# Patient Record
Sex: Female | Born: 1950
Health system: Southern US, Community
[De-identification: ages and names within clinical notes are randomized; demographics above are authoritative.]

## PROBLEM LIST (undated history)

## (undated) DIAGNOSIS — T7840XA Allergy, unspecified, initial encounter: Secondary | ICD-10-CM

## (undated) DIAGNOSIS — C3490 Malignant neoplasm of unspecified part of unspecified bronchus or lung: Secondary | ICD-10-CM

## (undated) DIAGNOSIS — N3010 Interstitial cystitis (chronic) without hematuria: Secondary | ICD-10-CM

## (undated) DIAGNOSIS — E079 Disorder of thyroid, unspecified: Secondary | ICD-10-CM

## (undated) DIAGNOSIS — K219 Gastro-esophageal reflux disease without esophagitis: Secondary | ICD-10-CM

## (undated) HISTORY — DX: Gastro-esophageal reflux disease without esophagitis: K21.9

## (undated) HISTORY — DX: Allergy, unspecified, initial encounter: T78.40XA

## (undated) HISTORY — PX: ABDOMINAL HYSTERECTOMY: SHX81

## (undated) HISTORY — DX: Malignant neoplasm of unspecified part of unspecified bronchus or lung: C34.90

## (undated) HISTORY — DX: Interstitial cystitis (chronic) without hematuria: N30.10

## (undated) HISTORY — PX: OTHER SURGICAL HISTORY: SHX169

---

## 1998-05-02 ENCOUNTER — Other Ambulatory Visit: Admission: RE | Admit: 1998-05-02 | Discharge: 1998-05-02 | Payer: Self-pay | Admitting: Obstetrics and Gynecology

## 1998-10-24 ENCOUNTER — Ambulatory Visit (HOSPITAL_COMMUNITY): Admission: RE | Admit: 1998-10-24 | Discharge: 1998-10-24 | Payer: Self-pay | Admitting: Gastroenterology

## 1999-05-29 ENCOUNTER — Other Ambulatory Visit: Admission: RE | Admit: 1999-05-29 | Discharge: 1999-05-29 | Payer: Self-pay | Admitting: Obstetrics and Gynecology

## 2000-01-14 ENCOUNTER — Encounter: Payer: Self-pay | Admitting: Orthopedic Surgery

## 2000-01-14 ENCOUNTER — Encounter: Admission: RE | Admit: 2000-01-14 | Discharge: 2000-01-14 | Payer: Self-pay | Admitting: Orthopedic Surgery

## 2000-07-04 ENCOUNTER — Emergency Department (HOSPITAL_COMMUNITY): Admission: EM | Admit: 2000-07-04 | Discharge: 2000-07-04 | Payer: Self-pay

## 2000-07-29 ENCOUNTER — Other Ambulatory Visit: Admission: RE | Admit: 2000-07-29 | Discharge: 2000-07-29 | Payer: Self-pay | Admitting: Obstetrics and Gynecology

## 2001-05-26 ENCOUNTER — Emergency Department (HOSPITAL_COMMUNITY): Admission: EM | Admit: 2001-05-26 | Discharge: 2001-05-27 | Payer: Self-pay | Admitting: Emergency Medicine

## 2001-08-12 ENCOUNTER — Other Ambulatory Visit: Admission: RE | Admit: 2001-08-12 | Discharge: 2001-08-12 | Payer: Self-pay | Admitting: Obstetrics and Gynecology

## 2002-08-12 ENCOUNTER — Other Ambulatory Visit: Admission: RE | Admit: 2002-08-12 | Discharge: 2002-08-12 | Payer: Self-pay | Admitting: Obstetrics and Gynecology

## 2003-08-16 ENCOUNTER — Other Ambulatory Visit: Admission: RE | Admit: 2003-08-16 | Discharge: 2003-08-16 | Payer: Self-pay | Admitting: Obstetrics and Gynecology

## 2007-08-08 DIAGNOSIS — E042 Nontoxic multinodular goiter: Secondary | ICD-10-CM | POA: Insufficient documentation

## 2008-08-16 DIAGNOSIS — E039 Hypothyroidism, unspecified: Secondary | ICD-10-CM | POA: Insufficient documentation

## 2008-08-16 DIAGNOSIS — R0602 Shortness of breath: Secondary | ICD-10-CM | POA: Insufficient documentation

## 2008-08-16 DIAGNOSIS — E785 Hyperlipidemia, unspecified: Secondary | ICD-10-CM | POA: Insufficient documentation

## 2010-05-21 ENCOUNTER — Emergency Department (HOSPITAL_BASED_OUTPATIENT_CLINIC_OR_DEPARTMENT_OTHER): Admission: EM | Admit: 2010-05-21 | Discharge: 2010-05-21 | Payer: Self-pay | Admitting: Emergency Medicine

## 2010-05-21 ENCOUNTER — Ambulatory Visit: Payer: Self-pay | Admitting: Diagnostic Radiology

## 2010-11-25 ENCOUNTER — Encounter: Payer: Self-pay | Admitting: Endocrinology

## 2010-12-15 ENCOUNTER — Emergency Department (INDEPENDENT_AMBULATORY_CARE_PROVIDER_SITE_OTHER): Payer: BC Managed Care – PPO

## 2010-12-15 ENCOUNTER — Emergency Department (HOSPITAL_BASED_OUTPATIENT_CLINIC_OR_DEPARTMENT_OTHER)
Admission: EM | Admit: 2010-12-15 | Discharge: 2010-12-15 | Disposition: A | Discharge status: Home / Self Care | Payer: BC Managed Care – PPO | Attending: Emergency Medicine | Admitting: Emergency Medicine

## 2010-12-15 DIAGNOSIS — W010XXA Fall on same level from slipping, tripping and stumbling without subsequent striking against object, initial encounter: Secondary | ICD-10-CM

## 2010-12-15 DIAGNOSIS — E039 Hypothyroidism, unspecified: Secondary | ICD-10-CM | POA: Insufficient documentation

## 2010-12-15 DIAGNOSIS — Y92009 Unspecified place in unspecified non-institutional (private) residence as the place of occurrence of the external cause: Secondary | ICD-10-CM | POA: Insufficient documentation

## 2010-12-15 DIAGNOSIS — M542 Cervicalgia: Secondary | ICD-10-CM

## 2010-12-15 DIAGNOSIS — S139XXA Sprain of joints and ligaments of unspecified parts of neck, initial encounter: Secondary | ICD-10-CM | POA: Insufficient documentation

## 2010-12-15 DIAGNOSIS — W19XXXA Unspecified fall, initial encounter: Secondary | ICD-10-CM | POA: Insufficient documentation

## 2011-01-19 LAB — URINALYSIS, ROUTINE W REFLEX MICROSCOPIC
Bilirubin Urine: NEGATIVE
Glucose, UA: NEGATIVE mg/dL
Hgb urine dipstick: NEGATIVE
Ketones, ur: NEGATIVE mg/dL
Nitrite: NEGATIVE
Protein, ur: NEGATIVE mg/dL
Specific Gravity, Urine: 1.019 (ref 1.005–1.030)
Urobilinogen, UA: 0.2 mg/dL (ref 0.0–1.0)
pH: 5.5 (ref 5.0–8.0)

## 2011-01-19 LAB — CBC
MCH: 32.5 pg (ref 26.0–34.0)
MCHC: 34.8 g/dL (ref 30.0–36.0)
Platelets: 231 10*3/uL (ref 150–400)

## 2011-01-19 LAB — POCT CARDIAC MARKERS: Myoglobin, poc: 45.6 ng/mL (ref 12–200)

## 2011-01-19 LAB — DIFFERENTIAL
Basophils Relative: 1 % (ref 0–1)
Eosinophils Absolute: 0.1 10*3/uL (ref 0.0–0.7)
Eosinophils Relative: 2 % (ref 0–5)
Monocytes Relative: 9 % (ref 3–12)
Neutrophils Relative %: 58 % (ref 43–77)

## 2011-01-19 LAB — BASIC METABOLIC PANEL
CO2: 30 mEq/L (ref 19–32)
Calcium: 8.8 mg/dL (ref 8.4–10.5)
Creatinine, Ser: 0.8 mg/dL (ref 0.4–1.2)
Glucose, Bld: 83 mg/dL (ref 70–99)

## 2011-01-19 LAB — URINE CULTURE: Colony Count: 75000

## 2011-04-09 ENCOUNTER — Emergency Department (HOSPITAL_COMMUNITY): Payer: BC Managed Care – PPO

## 2011-04-09 ENCOUNTER — Emergency Department (HOSPITAL_COMMUNITY)
Admission: EM | Admit: 2011-04-09 | Discharge: 2011-04-09 | Disposition: A | Discharge status: Home / Self Care | Payer: BC Managed Care – PPO | Attending: Emergency Medicine | Admitting: Emergency Medicine

## 2011-04-09 DIAGNOSIS — E039 Hypothyroidism, unspecified: Secondary | ICD-10-CM | POA: Insufficient documentation

## 2011-04-09 DIAGNOSIS — F29 Unspecified psychosis not due to a substance or known physiological condition: Secondary | ICD-10-CM | POA: Insufficient documentation

## 2011-04-09 DIAGNOSIS — R4182 Altered mental status, unspecified: Secondary | ICD-10-CM | POA: Insufficient documentation

## 2011-04-09 DIAGNOSIS — R5381 Other malaise: Secondary | ICD-10-CM | POA: Insufficient documentation

## 2011-04-09 LAB — BASIC METABOLIC PANEL
CO2: 28 mEq/L (ref 19–32)
Chloride: 102 mEq/L (ref 96–112)
GFR calc Af Amer: >60 mL/min (ref >60)
Potassium: 3.9 mEq/L (ref 3.5–5.1)
Sodium: 138 mEq/L (ref 135–145)

## 2011-04-09 LAB — AMMONIA: Ammonia: 22 umol/L (ref 11–60)

## 2011-04-09 LAB — URINALYSIS, ROUTINE W REFLEX MICROSCOPIC
Bilirubin Urine: NEGATIVE
Glucose, UA: NEGATIVE mg/dL
Hgb urine dipstick: NEGATIVE
Specific Gravity, Urine: 1.02 (ref 1.005–1.030)
Urobilinogen, UA: 0.2 mg/dL (ref 0.0–1.0)

## 2011-04-09 LAB — RAPID URINE DRUG SCREEN, HOSP PERFORMED
Amphetamines: NOT DETECTED
Barbiturates: NOT DETECTED
Benzodiazepines: NOT DETECTED
Cocaine: NOT DETECTED
Opiates: NOT DETECTED

## 2011-04-09 LAB — CBC
HCT: 42.2 % (ref 36.0–46.0)
Hemoglobin: 14.1 g/dL (ref 12.0–15.0)
MCV: 90.6 fL (ref 78.0–100.0)
RDW: 13.3 % (ref 11.5–15.5)
WBC: 6.9 10*3/uL (ref 4.0–10.5)

## 2011-04-09 LAB — ETHANOL: Alcohol, Ethyl (B): <11 mg/dL — ABNORMAL HIGH (ref 0–10)

## 2011-06-22 ENCOUNTER — Emergency Department (INDEPENDENT_AMBULATORY_CARE_PROVIDER_SITE_OTHER): Payer: BC Managed Care – PPO

## 2011-06-22 ENCOUNTER — Encounter: Payer: Self-pay | Admitting: Emergency Medicine

## 2011-06-22 ENCOUNTER — Emergency Department (HOSPITAL_BASED_OUTPATIENT_CLINIC_OR_DEPARTMENT_OTHER)
Admission: EM | Admit: 2011-06-22 | Discharge: 2011-06-22 | Disposition: A | Discharge status: Home / Self Care | Payer: BC Managed Care – PPO | Attending: Emergency Medicine | Admitting: Emergency Medicine

## 2011-06-22 DIAGNOSIS — R209 Unspecified disturbances of skin sensation: Secondary | ICD-10-CM

## 2011-06-22 DIAGNOSIS — E079 Disorder of thyroid, unspecified: Secondary | ICD-10-CM | POA: Insufficient documentation

## 2011-06-22 DIAGNOSIS — M542 Cervicalgia: Secondary | ICD-10-CM

## 2011-06-22 DIAGNOSIS — Y92410 Unspecified street and highway as the place of occurrence of the external cause: Secondary | ICD-10-CM | POA: Insufficient documentation

## 2011-06-22 DIAGNOSIS — R51 Headache: Secondary | ICD-10-CM

## 2011-06-22 DIAGNOSIS — S139XXA Sprain of joints and ligaments of unspecified parts of neck, initial encounter: Secondary | ICD-10-CM | POA: Insufficient documentation

## 2011-06-22 DIAGNOSIS — S161XXA Strain of muscle, fascia and tendon at neck level, initial encounter: Secondary | ICD-10-CM

## 2011-06-22 DIAGNOSIS — M549 Dorsalgia, unspecified: Secondary | ICD-10-CM

## 2011-06-22 DIAGNOSIS — M4120 Other idiopathic scoliosis, site unspecified: Secondary | ICD-10-CM

## 2011-06-22 DIAGNOSIS — M47812 Spondylosis without myelopathy or radiculopathy, cervical region: Secondary | ICD-10-CM

## 2011-06-22 HISTORY — DX: Disorder of thyroid, unspecified: E07.9

## 2011-06-22 MED ORDER — TRAMADOL HCL 50 MG PO TABS
100.0000 mg | ORAL_TABLET | Freq: Once | ORAL | Status: AC
  Administered 2011-06-22: 100 mg via ORAL
  Filled 2011-06-22: qty 2

## 2011-06-22 MED ORDER — TRAMADOL HCL 50 MG PO TABS: 50.0000 mg | ORAL_TABLET | Freq: Four times a day (QID) | ORAL | Status: AC | PRN
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
## 2011-06-23 NOTE — ED Provider Notes (Signed)
History/physical exam/procedure(s) were performed by non-physician practitioner and as supervising physician I was immediately available for consultation/collaboration. I have reviewed all notes and am in agreement with care and plan.   Hilario Quarry, MD 06/23/11 [phone]

## 2011-07-11 ENCOUNTER — Other Ambulatory Visit: Payer: Self-pay | Admitting: Specialist

## 2011-07-11 DIAGNOSIS — M542 Cervicalgia: Secondary | ICD-10-CM

## 2011-07-11 DIAGNOSIS — S161XXA Strain of muscle, fascia and tendon at neck level, initial encounter: Secondary | ICD-10-CM

## 2011-07-11 DIAGNOSIS — M5412 Radiculopathy, cervical region: Secondary | ICD-10-CM

## 2011-07-16 ENCOUNTER — Ambulatory Visit
Admission: RE | Admit: 2011-07-16 | Discharge: 2011-07-16 | Disposition: A | Discharge status: Home / Self Care | Payer: BC Managed Care – PPO | Source: Ambulatory Visit | Attending: Specialist | Admitting: Specialist

## 2011-07-16 DIAGNOSIS — S161XXA Strain of muscle, fascia and tendon at neck level, initial encounter: Secondary | ICD-10-CM

## 2011-07-16 DIAGNOSIS — M5412 Radiculopathy, cervical region: Secondary | ICD-10-CM

## 2011-07-16 DIAGNOSIS — M542 Cervicalgia: Secondary | ICD-10-CM

## 2011-08-08 ENCOUNTER — Other Ambulatory Visit: Payer: Self-pay | Admitting: Specialist

## 2011-08-08 DIAGNOSIS — M5416 Radiculopathy, lumbar region: Secondary | ICD-10-CM

## 2011-08-13 ENCOUNTER — Ambulatory Visit
Admission: RE | Admit: 2011-08-13 | Discharge: 2011-08-13 | Disposition: A | Discharge status: Home / Self Care | Payer: BC Managed Care – PPO | Source: Ambulatory Visit | Attending: Specialist | Admitting: Specialist

## 2011-08-13 DIAGNOSIS — M5416 Radiculopathy, lumbar region: Secondary | ICD-10-CM

## 2011-11-13 ENCOUNTER — Other Ambulatory Visit: Payer: Self-pay | Admitting: Specialist

## 2011-11-13 DIAGNOSIS — M5414 Radiculopathy, thoracic region: Secondary | ICD-10-CM

## 2011-11-15 ENCOUNTER — Ambulatory Visit
Admission: RE | Admit: 2011-11-15 | Discharge: 2011-11-15 | Disposition: A | Discharge status: Home / Self Care | Payer: BC Managed Care – PPO | Source: Ambulatory Visit | Attending: Specialist | Admitting: Specialist

## 2011-11-15 DIAGNOSIS — M5414 Radiculopathy, thoracic region: Secondary | ICD-10-CM

## 2012-02-09 ENCOUNTER — Emergency Department (INDEPENDENT_AMBULATORY_CARE_PROVIDER_SITE_OTHER): Payer: BC Managed Care – PPO

## 2012-02-09 ENCOUNTER — Encounter (HOSPITAL_BASED_OUTPATIENT_CLINIC_OR_DEPARTMENT_OTHER): Payer: Self-pay | Admitting: *Deleted

## 2012-02-09 ENCOUNTER — Emergency Department (HOSPITAL_BASED_OUTPATIENT_CLINIC_OR_DEPARTMENT_OTHER)
Admission: EM | Admit: 2012-02-09 | Discharge: 2012-02-09 | Disposition: A | Discharge status: Home / Self Care | Payer: BC Managed Care – PPO | Attending: Emergency Medicine | Admitting: Emergency Medicine

## 2012-02-09 DIAGNOSIS — M7989 Other specified soft tissue disorders: Secondary | ICD-10-CM

## 2012-02-09 DIAGNOSIS — W19XXXA Unspecified fall, initial encounter: Secondary | ICD-10-CM

## 2012-02-09 DIAGNOSIS — M79609 Pain in unspecified limb: Secondary | ICD-10-CM | POA: Insufficient documentation

## 2012-02-09 DIAGNOSIS — M25579 Pain in unspecified ankle and joints of unspecified foot: Secondary | ICD-10-CM | POA: Insufficient documentation

## 2012-02-09 DIAGNOSIS — S93409A Sprain of unspecified ligament of unspecified ankle, initial encounter: Secondary | ICD-10-CM | POA: Insufficient documentation

## 2012-02-09 DIAGNOSIS — E079 Disorder of thyroid, unspecified: Secondary | ICD-10-CM | POA: Insufficient documentation

## 2012-02-09 DIAGNOSIS — T07XXXA Unspecified multiple injuries, initial encounter: Secondary | ICD-10-CM

## 2012-02-09 DIAGNOSIS — M25569 Pain in unspecified knee: Secondary | ICD-10-CM | POA: Insufficient documentation

## 2012-02-09 DIAGNOSIS — Z043 Encounter for examination and observation following other accident: Secondary | ICD-10-CM

## 2012-02-09 DIAGNOSIS — IMO0002 Reserved for concepts with insufficient information to code with codable children: Secondary | ICD-10-CM | POA: Insufficient documentation

## 2012-02-09 MED ORDER — OXYCODONE-ACETAMINOPHEN 5-325 MG PO TABS
2.0000 | ORAL_TABLET | Freq: Once | ORAL | Status: AC
  Administered 2012-02-09: 2 via ORAL
  Filled 2012-02-09: qty 2

## 2012-02-09 MED ORDER — IBUPROFEN 800 MG PO TABS
800.0000 mg | ORAL_TABLET | Freq: Once | ORAL | Status: AC
  Administered 2012-02-09: 800 mg via ORAL
  Filled 2012-02-09: qty 1

## 2012-02-09 MED ORDER — HYDROCODONE-ACETAMINOPHEN 5-500 MG PO TABS: 1.0000 | ORAL_TABLET | Freq: Four times a day (QID) | ORAL | Status: AC | PRN

## 2012-02-09 MED ORDER — NAPROXEN 500 MG PO TABS: 500.0000 mg | ORAL_TABLET | Freq: Two times a day (BID) | ORAL | Status: AC

## 2012-02-09 NOTE — ED Notes (Signed)
Pt states she was walking and fell. Abrasions to knees. Swelling to right ankle and c/o pain to same. Ice applied at triage.

## 2012-02-09 NOTE — Discharge Instructions (Signed)
Ankle Sprain An ankle sprain is an injury to the strong, fibrous tissues (ligaments) that hold the bones of your ankle joint together.  CAUSES Ankle sprain usually is caused by a fall or by twisting your ankle. People who participate in sports are more prone to these types of injuries.  SYMPTOMS  Symptoms of ankle sprain include:  Pain in your ankle. The pain may be present at rest or only when you are trying to stand or walk.   Swelling.   Bruising. Bruising may develop immediately or within 1 to 2 days after your injury.   Difficulty standing or walking.  DIAGNOSIS  Your caregiver will ask you details about your injury and perform a physical exam of your ankle to determine if you have an ankle sprain. During the physical exam, your caregiver will press and squeeze specific areas of your foot and ankle. Your caregiver will try to move your ankle in certain ways. An X-ray exam may be done to be sure a bone was not broken or a ligament did not separate from one of the bones in your ankle (avulsion).  TREATMENT  Certain types of braces can help stabilize your ankle. Your caregiver can make a recommendation for this. Your caregiver may recommend the use of medication for pain. If your sprain is severe, your caregiver may refer you to a surgeon who helps to restore function to parts of your skeletal system (orthopedist) or a physical therapist. HOME CARE INSTRUCTIONS  Apply ice to your injury for 1 to 2 days or as directed by your caregiver. Applying ice helps to reduce inflammation and pain.  Put ice in a plastic bag.   Place a towel between your skin and the bag.   Leave the ice on for 15 to 20 minutes at a time, every 2 hours while you are awake.   Take over-the-counter or prescription medicines for pain, discomfort, or fever only as directed by your caregiver.   Keep your injured leg elevated, when possible, to lessen swelling.   If your caregiver recommends crutches, use them as  instructed. Gradually, put weight on the affected ankle. Continue to use crutches or a cane until you can walk without feeling pain in your ankle.   If you have a plaster splint, wear the splint as directed by your caregiver. Do not rest it on anything harder than a pillow the first 24 hours. Do not put weight on it. Do not get it wet. You may take it off to take a shower or bath.   You may have been given an elastic bandage to wear around your ankle to provide support. If the elastic bandage is too tight (you have numbness or tingling in your foot or your foot becomes cold and blue), adjust the bandage to make it comfortable.   If you have an air splint, you may blow more air into it or let air out to make it more comfortable. You may take your splint off at night and before taking a shower or bath.   Wiggle your toes in the splint several times per day if you are able.  SEEK MEDICAL CARE IF:   You have an increase in bruising, swelling, or pain.   Your toes feel cold.   Pain relief is not achieved with medication.  SEEK IMMEDIATE MEDICAL CARE IF: Your toes are numb or blue or you have severe pain. MAKE SURE YOU:   Understand these instructions.   Will watch your condition.     Will get help right away if you are not doing well or get worse.  Document Released: 10/21/2005 Document Revised: 10/10/2011 Document Reviewed: 05/25/2008 [employer] Patient Information 2012 [employer]. 
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
## 2012-03-04 ENCOUNTER — Other Ambulatory Visit: Payer: Self-pay | Admitting: Obstetrics and Gynecology

## 2012-03-04 NOTE — Telephone Encounter (Signed)
Tc to pt per rx req. Lm on vm to make pt aware rx has been sent to pharm as requested.

## 2012-03-18 ENCOUNTER — Encounter: Payer: Self-pay | Admitting: Obstetrics and Gynecology

## 2012-03-20 ENCOUNTER — Encounter: Payer: Self-pay | Admitting: Obstetrics and Gynecology

## 2012-03-20 ENCOUNTER — Ambulatory Visit (INDEPENDENT_AMBULATORY_CARE_PROVIDER_SITE_OTHER): Payer: BC Managed Care – PPO | Admitting: Obstetrics and Gynecology

## 2012-03-20 VITALS — BP 112/68 | Ht 64.0 in | Wt 137.0 lb

## 2012-03-20 DIAGNOSIS — N6011 Diffuse cystic mastopathy of right breast: Secondary | ICD-10-CM | POA: Insufficient documentation

## 2012-03-20 DIAGNOSIS — N6009 Solitary cyst of unspecified breast: Secondary | ICD-10-CM

## 2012-03-20 DIAGNOSIS — E039 Hypothyroidism, unspecified: Secondary | ICD-10-CM

## 2012-03-20 DIAGNOSIS — E079 Disorder of thyroid, unspecified: Secondary | ICD-10-CM

## 2012-03-20 DIAGNOSIS — N3010 Interstitial cystitis (chronic) without hematuria: Secondary | ICD-10-CM

## 2012-03-20 DIAGNOSIS — N951 Menopausal and female climacteric states: Secondary | ICD-10-CM

## 2012-03-20 DIAGNOSIS — R3915 Urgency of urination: Secondary | ICD-10-CM

## 2012-03-20 NOTE — Progress Notes (Signed)
The patient is not taking hormone replacement therapy The patient  is taking a Calcium supplement. Post-menopausal bleeding:no  Last Pap: was normal October  2003 Last mammogram: was normal April  2013 Last DEXA scan : T= pt unsure    Last colonoscopy:was normal  6 years ago  Urinary symptoms: none Normal bowel movements: Yes Reports abuse at home: No:  Subjective:    Jocelyn Parker is a 61 y.o. female No obstetric history on file. who presents for annual exam.  C/o recent onset of tremendous urge to void just prior to orgasm which can be supressed, but in doing so also aborts orgasm. She was also diagnosed with a right  breast cyst which has been noted on previous mammograms. She denies any significant breast pain at this time.  The following portions of the patient's history were reviewed and updated as appropriate: allergies, current medications, past family history, past medical history, past social history, past surgical history and problem list.  Review of Systems Pertinent items are noted in HPI. Gastrointestinal:No change in bowel habits, no abdominal pain, no rectal bleeding Genitourinary:negative for dysuria, frequency, hematuria, nocturia and urinary incontinence    Objective:     BP 112/68  Ht 5\' 4"  (1.626 m)  Wt 137 lb (62.143 kg)  BMI 23.52 kg/m2  Weight:  Wt Readings from Last 1 Encounters:  03/20/12 137 lb (62.143 kg)     BMI: Body mass index is 23.52 kg/(m^2). General Appearance: Alert, appropriate appearance for age. No acute distress HEENT: Grossly normal Neck / Thyroid: Supple, no masses, nodes or enlargement Lungs: clear to auscultation bilaterally Back: No CVA tenderness Breast Exam: Normal to inspection and right breast with a 3 cm cyst in the upper outer quadrant at approximately 11:00 position. This is mildly tender and easily mobile. Cardiovascular: Regular rate and rhythm. S1, S2, no murmur Gastrointestinal: Soft, non-tender, no masses or  organomegaly Pelvic Exam: External genitalia: normal general appearance Vaginal: atrophic mucosa and vaginal vault, is well suspended and well healed. Cervix: removed surgically Adnexa: non palpable Uterus: removed surgically Rectovaginal: normal rectal, no masses Lymphatic Exam: Non-palpable nodes in neck, clavicular, axillary, or inguinal regions Skin: no rash or abnormalities Neurologic: Normal gait and speech, no tremor  Psychiatric: Alert and oriented, appropriate affect.    Urinalysis:Not done today as she has regular followup with her urologist      Assessment:    Hormone replacement therapy Menopause Interstitial cystitis well-controlled  Good relief of symptoms of hot flashes Recurrent breast cysts Symptom ofperi-orgasmic urinary urgency   Plan:    All questions answered. Breast self exam technique reviewed and patient encouraged to perform self-exam monthly. Diagnosis explained in detail, including differential. referral to physical therapy for evaluation and treatment of orgasmic urinary urgency   Follow-up:  for annual exam

## 2012-03-21 ENCOUNTER — Encounter: Payer: Self-pay | Admitting: Obstetrics and Gynecology

## 2012-03-21 DIAGNOSIS — E079 Disorder of thyroid, unspecified: Secondary | ICD-10-CM | POA: Insufficient documentation

## 2012-03-21 MED ORDER — ESTRADIOL 0.1 MG/24HR TD PTTW: 1.0000 | MEDICATED_PATCH | TRANSDERMAL | Status: AC

## 2012-03-23 ENCOUNTER — Ambulatory Visit: Payer: Self-pay | Admitting: Obstetrics and Gynecology

## 2012-04-10 DIAGNOSIS — L650 Telogen effluvium: Secondary | ICD-10-CM | POA: Insufficient documentation

## 2012-05-24 IMAGING — CT CT HEAD W/O CM
1 series · 16 of 30 positions shown, 20 images · non-contrast
Comparison: None available

CLINICAL DATA: Altered mental status, confusion, lethargy

CT HEAD WITHOUT CONTRAST
TECHNIQUE: Contiguous axial images were obtained from the base of
the skull through the vertex without contrast

[Series 3: headseq 4.8 h45s · axial · 0.43mm/px · z∈[-163,-34]mm · 16 of 30 slices shown, 20 images]
[im 2/30  brain]
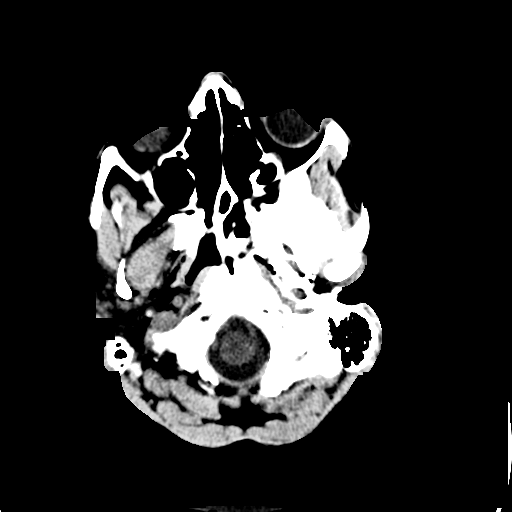
[im 2/30  bone]
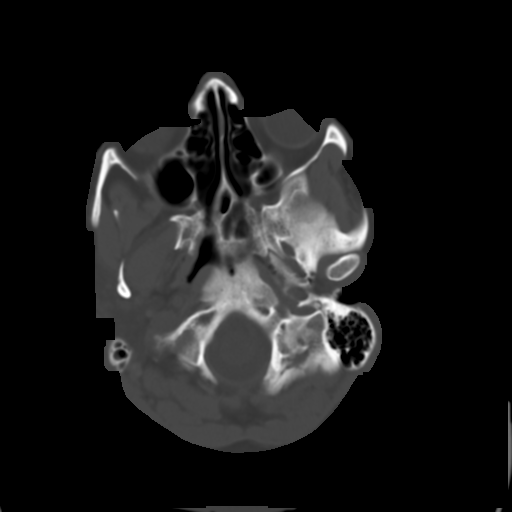
[im 4/30  brain]
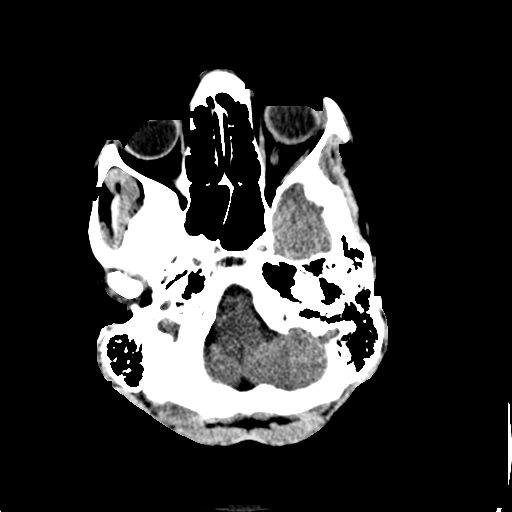
[im 6/30  brain]
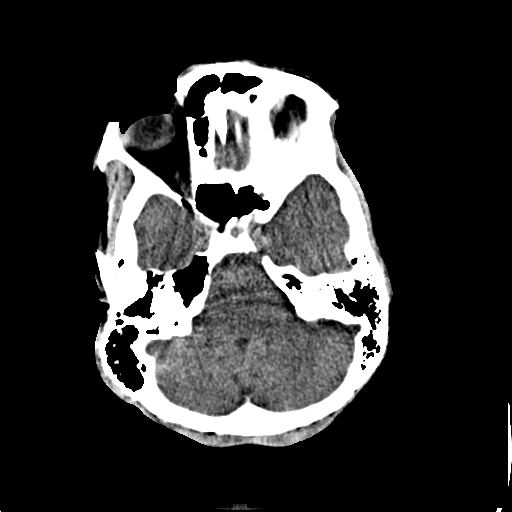
[im 8/30  brain]
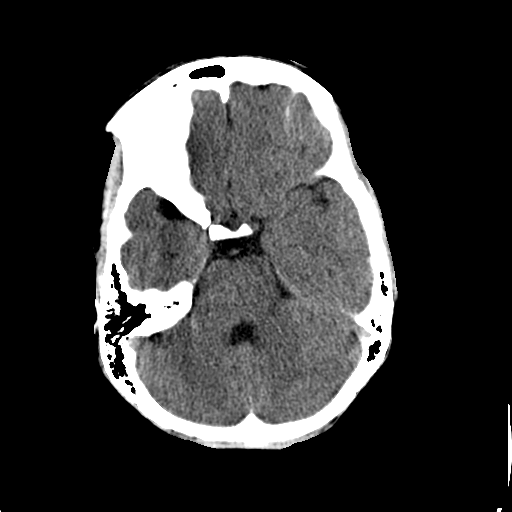
[im 9/30  brain]
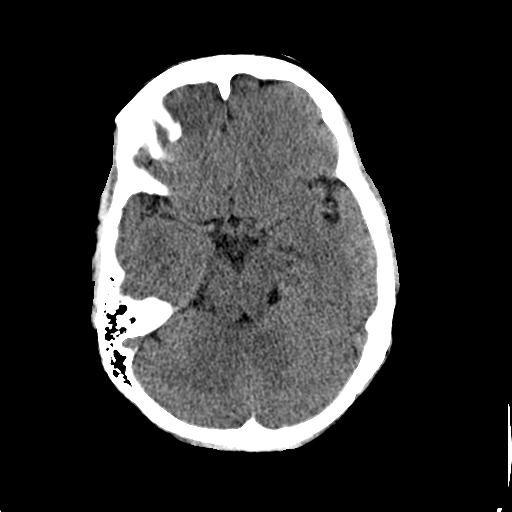
[im 9/30  bone]
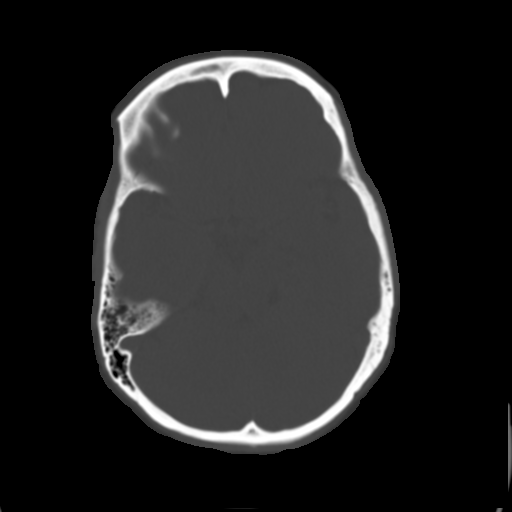
[im 11/30  brain]
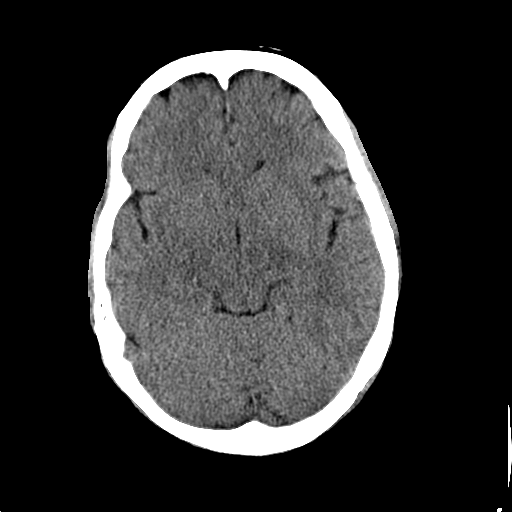
[im 13/30  brain]
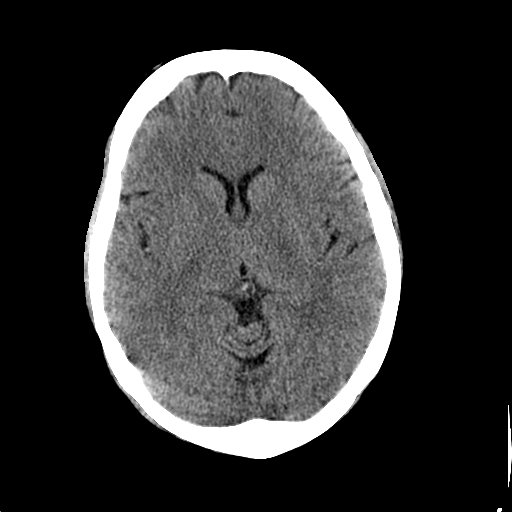
[im 15/30  brain]
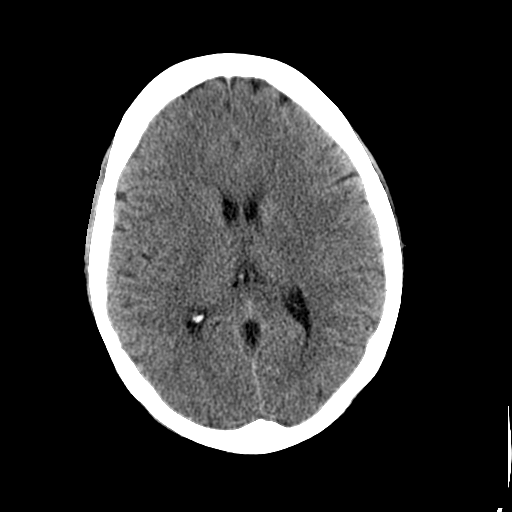
[im 16/30  brain]
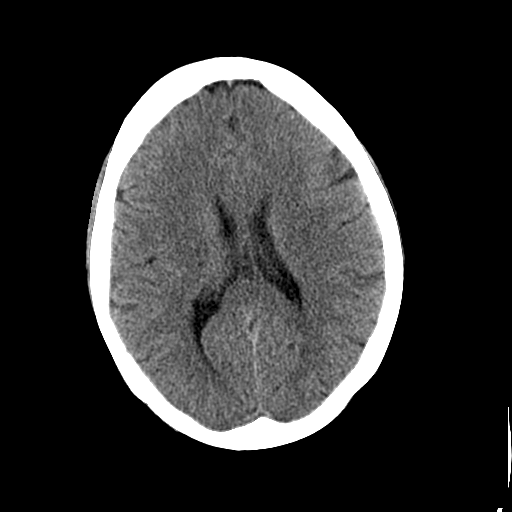
[im 16/30  bone]
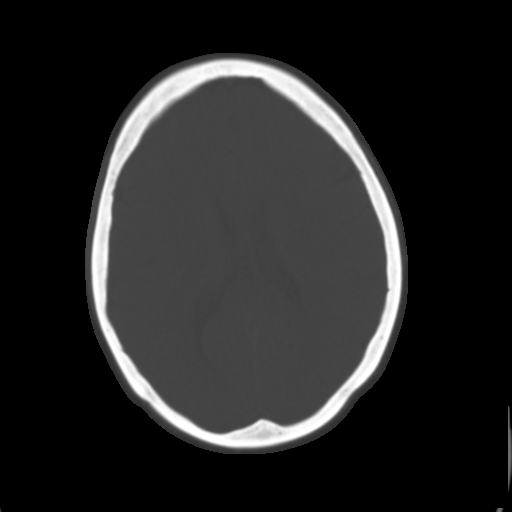
[im 18/30  brain]
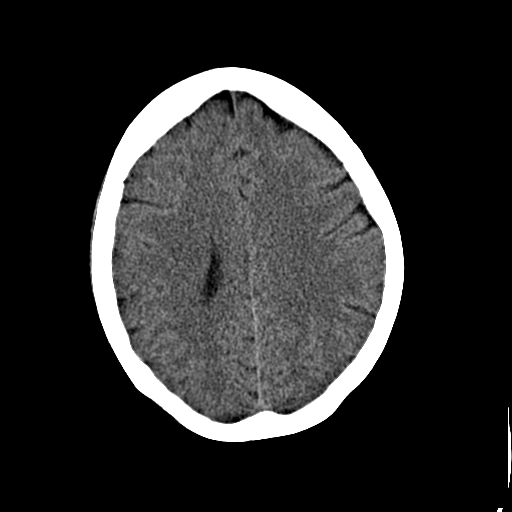
[im 20/30  brain]
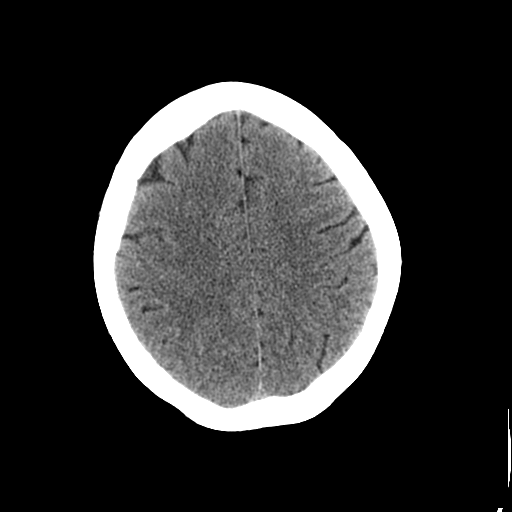
[im 22/30  brain]
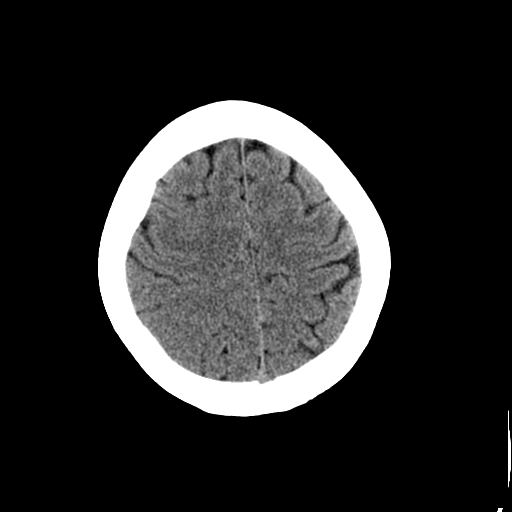
[im 23/30  brain]
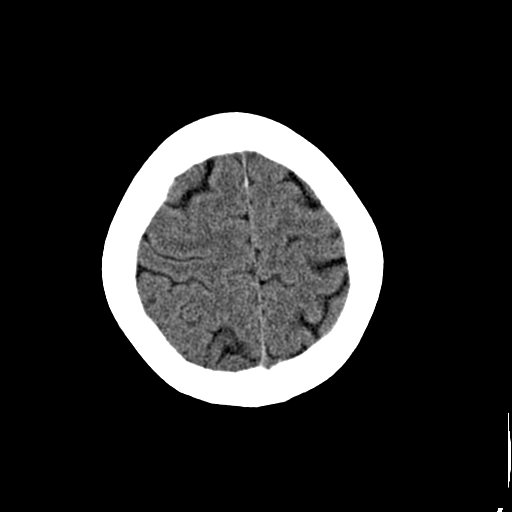
[im 23/30  bone]
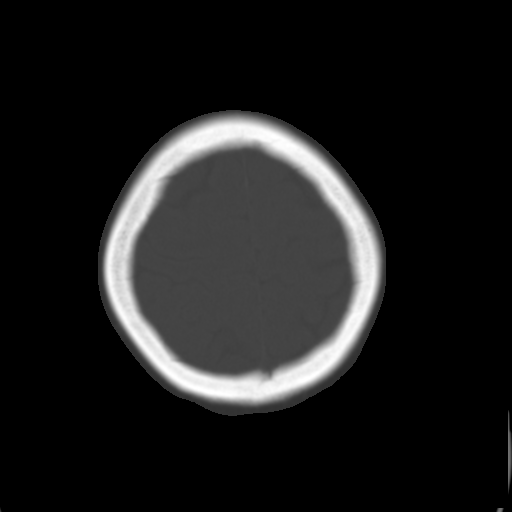
[im 25/30  brain]
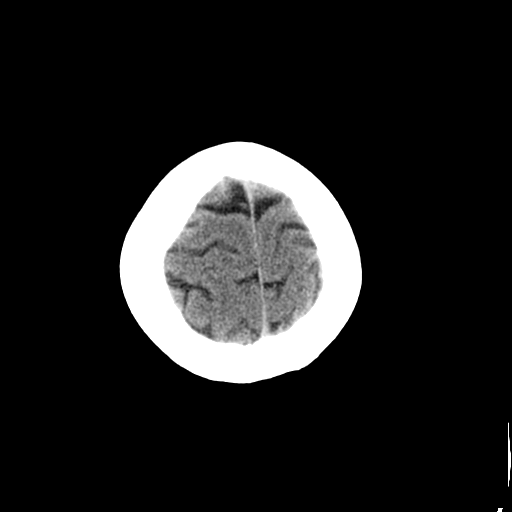
[im 27/30  brain]
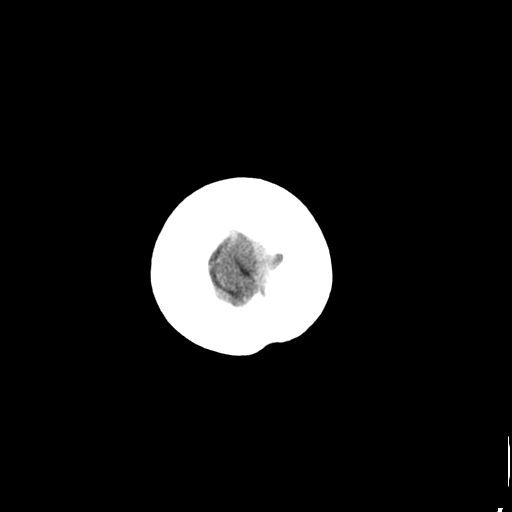
[im 29/30  brain]
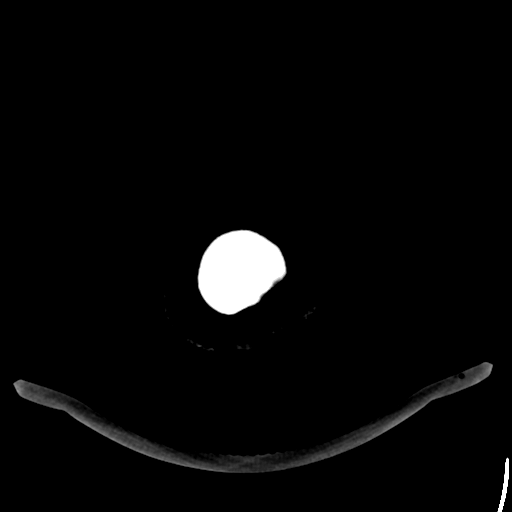

[16 of 30 positions shown; findings below may reference images not displayed]

FINDINGS: The brain has a normal appearance without evidence for
hemorrhage, acute infarction, hydrocephalus, or mass lesion.  There
is no extra axial fluid collection.  The skull and paranasal
sinuses are normal.
IMPRESSION: Normal CT of the head without contrast.

## 2012-09-07 DIAGNOSIS — R319 Hematuria, unspecified: Secondary | ICD-10-CM | POA: Insufficient documentation

## 2013-01-04 ENCOUNTER — Other Ambulatory Visit: Payer: Self-pay | Admitting: Gastroenterology

## 2013-01-04 DIAGNOSIS — R109 Unspecified abdominal pain: Secondary | ICD-10-CM

## 2013-01-06 ENCOUNTER — Ambulatory Visit
Admission: RE | Admit: 2013-01-06 | Discharge: 2013-01-06 | Disposition: A | Discharge status: Home / Self Care | Payer: BC Managed Care – PPO | Source: Ambulatory Visit | Attending: Gastroenterology | Admitting: Gastroenterology

## 2013-01-06 DIAGNOSIS — R109 Unspecified abdominal pain: Secondary | ICD-10-CM

## 2013-01-06 MED ORDER — IOHEXOL 300 MG/ML  SOLN
100.0000 mL | Freq: Once | INTRAMUSCULAR | Status: AC | PRN
  Administered 2013-01-06: 100 mL via INTRAVENOUS

## 2013-07-28 ENCOUNTER — Other Ambulatory Visit: Payer: Self-pay | Admitting: Endocrinology

## 2013-07-28 DIAGNOSIS — E049 Nontoxic goiter, unspecified: Secondary | ICD-10-CM

## 2014-01-03 DIAGNOSIS — K589 Irritable bowel syndrome without diarrhea: Secondary | ICD-10-CM | POA: Insufficient documentation

## 2014-07-28 ENCOUNTER — Other Ambulatory Visit: Payer: Self-pay | Admitting: Endocrinology

## 2014-07-28 DIAGNOSIS — E049 Nontoxic goiter, unspecified: Secondary | ICD-10-CM

## 2014-08-08 ENCOUNTER — Other Ambulatory Visit: Payer: BC Managed Care – PPO

## 2014-12-20 DIAGNOSIS — N281 Cyst of kidney, acquired: Secondary | ICD-10-CM | POA: Insufficient documentation

## 2014-12-20 DIAGNOSIS — N390 Urinary tract infection, site not specified: Secondary | ICD-10-CM | POA: Insufficient documentation

## 2014-12-21 DIAGNOSIS — E755 Other lipid storage disorders: Secondary | ICD-10-CM | POA: Insufficient documentation

## 2014-12-21 DIAGNOSIS — H0260 Xanthelasma of unspecified eye, unspecified eyelid: Secondary | ICD-10-CM | POA: Insufficient documentation

## 2015-05-24 DIAGNOSIS — E559 Vitamin D deficiency, unspecified: Secondary | ICD-10-CM | POA: Insufficient documentation

## 2016-01-02 DIAGNOSIS — G8929 Other chronic pain: Secondary | ICD-10-CM | POA: Insufficient documentation

## 2016-01-02 DIAGNOSIS — R109 Unspecified abdominal pain: Secondary | ICD-10-CM | POA: Insufficient documentation

## 2016-02-03 ENCOUNTER — Encounter (HOSPITAL_BASED_OUTPATIENT_CLINIC_OR_DEPARTMENT_OTHER): Payer: Self-pay | Admitting: Emergency Medicine

## 2016-02-03 DIAGNOSIS — E079 Disorder of thyroid, unspecified: Secondary | ICD-10-CM | POA: Diagnosis not present

## 2016-02-03 DIAGNOSIS — K529 Noninfective gastroenteritis and colitis, unspecified: Secondary | ICD-10-CM | POA: Insufficient documentation

## 2016-02-03 DIAGNOSIS — Z90710 Acquired absence of both cervix and uterus: Secondary | ICD-10-CM | POA: Insufficient documentation

## 2016-02-03 DIAGNOSIS — Z87448 Personal history of other diseases of urinary system: Secondary | ICD-10-CM | POA: Diagnosis not present

## 2016-02-03 DIAGNOSIS — Z79899 Other long term (current) drug therapy: Secondary | ICD-10-CM | POA: Insufficient documentation

## 2016-02-03 DIAGNOSIS — R109 Unspecified abdominal pain: Secondary | ICD-10-CM | POA: Diagnosis present

## 2016-02-03 NOTE — ED Notes (Signed)
Pt in c/o abd pain and diarrhea onset today.

## 2016-02-04 ENCOUNTER — Emergency Department (HOSPITAL_BASED_OUTPATIENT_CLINIC_OR_DEPARTMENT_OTHER)
Admission: EM | Admit: 2016-02-04 | Discharge: 2016-02-04 | Disposition: A | Discharge status: Home / Self Care | Payer: BC Managed Care – PPO | Attending: Emergency Medicine | Admitting: Emergency Medicine

## 2016-02-04 DIAGNOSIS — K529 Noninfective gastroenteritis and colitis, unspecified: Secondary | ICD-10-CM

## 2016-02-04 LAB — CBC WITH DIFFERENTIAL/PLATELET
BASOS ABS: 0 10*3/uL (ref 0.0–0.1)
Basophils Relative: 0 %
EOS ABS: 0 10*3/uL (ref 0.0–0.7)
EOS PCT: 0 %
HCT: 40.9 % (ref 36.0–46.0)
Hemoglobin: 14.1 g/dL (ref 12.0–15.0)
LYMPHS PCT: 7 %
Lymphs Abs: 0.4 10*3/uL — ABNORMAL LOW (ref 0.7–4.0)
MCH: 31.9 pg (ref 26.0–34.0)
MCHC: 34.5 g/dL (ref 30.0–36.0)
MCV: 92.5 fL (ref 78.0–100.0)
MONO ABS: 0.3 10*3/uL (ref 0.1–1.0)
Monocytes Relative: 5 %
Neutro Abs: 5 10*3/uL (ref 1.7–7.7)
Neutrophils Relative %: 88 %
PLATELETS: 210 10*3/uL (ref 150–400)
RBC: 4.42 MIL/uL (ref 3.87–5.11)
RDW: 12.7 % (ref 11.5–15.5)
WBC: 5.7 10*3/uL (ref 4.0–10.5)

## 2016-02-04 LAB — COMPREHENSIVE METABOLIC PANEL
ALT: 15 U/L (ref 14–54)
ANION GAP: 7 (ref 5–15)
AST: 18 U/L (ref 15–41)
Albumin: 3.7 g/dL (ref 3.5–5.0)
Alkaline Phosphatase: 53 U/L (ref 38–126)
BUN: 14 mg/dL (ref 6–20)
CHLORIDE: 106 mmol/L (ref 101–111)
CO2: 24 mmol/L (ref 22–32)
Calcium: 8.5 mg/dL — ABNORMAL LOW (ref 8.9–10.3)
Creatinine, Ser: 0.9 mg/dL (ref 0.44–1.00)
Glucose, Bld: 112 mg/dL — ABNORMAL HIGH (ref 65–99)
POTASSIUM: 4 mmol/L (ref 3.5–5.1)
SODIUM: 137 mmol/L (ref 135–145)
Total Bilirubin: 0.9 mg/dL (ref 0.3–1.2)
Total Protein: 6.9 g/dL (ref 6.5–8.1)

## 2016-02-04 LAB — LIPASE, BLOOD: LIPASE: 17 U/L (ref 11–51)

## 2016-02-04 MED ORDER — ONDANSETRON HCL 4 MG/2ML IJ SOLN
4.0000 mg | Freq: Once | INTRAMUSCULAR | Status: AC
  Administered 2016-02-04: 4 mg via INTRAVENOUS
  Filled 2016-02-04: qty 2

## 2016-02-04 MED ORDER — KETOROLAC TROMETHAMINE 30 MG/ML IJ SOLN
30.0000 mg | Freq: Once | INTRAMUSCULAR | Status: AC
  Administered 2016-02-04: 30 mg via INTRAVENOUS
  Filled 2016-02-04: qty 1

## 2016-02-04 MED ORDER — SODIUM CHLORIDE 0.9 % IV BOLUS (SEPSIS)
1000.0000 mL | Freq: Once | INTRAVENOUS | Status: AC
  Administered 2016-02-04: 1000 mL via INTRAVENOUS

## 2016-02-04 MED ORDER — ONDANSETRON 8 MG PO TBDP: ORAL_TABLET | ORAL | Status: DC

## 2016-02-04 MED ORDER — HYDROCODONE-ACETAMINOPHEN 5-325 MG PO TABS: 1.0000 | ORAL_TABLET | Freq: Four times a day (QID) | ORAL | Status: DC | PRN

## 2016-02-04 NOTE — Discharge Instructions (Signed)
Zofran as prescribed as needed for nausea.  Hydrocodone as prescribed as needed for pain.  Return to the emergency department if pain significantly worsens, he developed high fevers, bloody stool, or other new and concerning symptoms.

## 2016-02-04 NOTE — ED Provider Notes (Signed)
CSN: [id number]     Arrival date & time 02/03/16  2326 History  By signing my name below, I, Terrance Branch, attest that this documentation has been prepared under the direction and in the presence of Veryl Speak, MD. Electronically Signed: Randa Evens, ED Scribe. 02/04/2016. 12:30 AM.    Chief Complaint  Patient presents with  . Abdominal Pain  . Diarrhea   Patient is a 65 y.o. female presenting with abdominal pain and diarrhea. The history is provided by the patient. No language interpreter was used.  Abdominal Pain Associated symptoms: diarrhea and nausea   Associated symptoms: no fever and no vomiting   Diarrhea Associated symptoms: abdominal pain   Associated symptoms: no fever and no vomiting    HPI Comments: Jocelyn Parker is a 65 y.o. female who presents to the Emergency Department complaining of intermittent abdominal pain onset today that she describes as a cramping pain. Pt reports associated nausea, diarrhea, decreased appetite and HA. Pt states she is unsure of sick contacts. Reports HX of hysterectomy. Denies blood in stool or vomiting.   Past Medical History  Diagnosis Date  . Thyroid disease   . Allergy     sulfa  . Chronic interstitial cystitis    Past Surgical History  Procedure Laterality Date  . Abdominal hysterectomy    . Lumps removed from breast      1970's ,1990's and in 2000's  nono cancer    Family History  Problem Relation Age of Onset  . Cancer Mother     renal   . Heart attack Father   . Stroke Maternal Grandmother   . Heart attack Paternal Grandfather    Social History  Substance Use Topics  . Smoking status: Never Smoker   . Smokeless tobacco: Never Used  . Alcohol Use: No   OB History    No data available     Review of Systems  Constitutional: Negative for fever.  Gastrointestinal: Positive for nausea, abdominal pain and diarrhea. Negative for vomiting and blood in stool.  All other systems reviewed and are  negative.    Allergies  Sulfa antibiotics  Home Medications   Prior to Admission medications   Medication Sig Start Date End Date Taking? Authorizing Provider  Estradiol (VIVELLE TD) Place 1 patch onto the skin 2 (two) times a week.      Historical Provider, MD  estradiol (VIVELLE-DOT) 0.1 MG/24HR Place 1 patch (0.1 mg total) onto the skin 2 (two) times a week. 03/21/12   Eldred Manges, MD  levothyroxine (SYNTHROID, LEVOTHROID) 75 MCG tablet Take 75 mcg by mouth daily.      Historical Provider, MD   BP 111/76 mmHg  Pulse 108  Temp(Src) 98.6 F (37 C) (Oral)  Resp 18  Ht '5\' 3"'$  (1.6 m)  Wt 123 lb (55.792 kg)  BMI 21.79 kg/m2  SpO2 98%   Physical Exam  Constitutional: She is oriented to person, place, and time. She appears well-developed and well-nourished. No distress.  HENT:  Head: Normocephalic and atraumatic.  Eyes: Conjunctivae and EOM are normal.  Neck: Neck supple. No tracheal deviation present.  Cardiovascular: Normal rate, regular rhythm and normal heart sounds.   No murmur heard. Pulmonary/Chest: Effort normal and breath sounds normal. No respiratory distress. She has no wheezes. She has no rales.  Abdominal: Soft. Bowel sounds are normal. She exhibits no distension. There is tenderness. There is no rebound and no guarding.  Mild TTP in the epigastric region.   Musculoskeletal: Normal  range of motion.  Neurological: She is alert and oriented to person, place, and time.  Skin: Skin is warm and dry.  Psychiatric: She has a normal mood and affect. Her behavior is normal.  Nursing note and vitals reviewed.   ED Course  Procedures (including critical care time) DIAGNOSTIC STUDIES: Oxygen Saturation is 98% on RA, normal by my interpretation.    COORDINATION OF CARE: 12:26 AM-Discussed treatment plan with pt at bedside and pt agreed to plan.     Labs Review Labs Reviewed - No data to display  Imaging Review No results found.     MDM   Final  diagnoses:  None      Patient presents with diarrhea and abdominal cramping. She has no fever or white count and her abdominal exam is benign. I highly suspect a viral gastroenteritis as the etiology. She will be discharged with pain and nausea medication and when necessary return.   I personally performed the services described in this documentation, which was scribed in my presence. The recorded information has been reviewed and is accurate.       Veryl Speak, MD 02/04/16 [phone]

## 2017-03-18 DIAGNOSIS — Z8249 Family history of ischemic heart disease and other diseases of the circulatory system: Secondary | ICD-10-CM | POA: Insufficient documentation

## 2017-09-02 DIAGNOSIS — Z90710 Acquired absence of both cervix and uterus: Secondary | ICD-10-CM | POA: Insufficient documentation

## 2019-03-19 LAB — BASIC METABOLIC PANEL
BUN: 14 (ref 4–21)
BUN: 14 (ref 4–21)
Creatinine: 0.8 (ref 0.5–1.1)
Glucose: 80
Potassium: 4.4 (ref 3.4–5.3)
Potassium: 4.4 (ref 3.4–5.3)
Sodium: 140 (ref 137–147)
Sodium: 140 (ref 137–147)

## 2019-03-19 LAB — HEPATIC FUNCTION PANEL
ALT: 21 (ref 7–35)
AST: 20 (ref 13–35)
Alkaline Phosphatase: 55 (ref 25–125)
Bilirubin, Total: 0.6

## 2019-03-19 LAB — CBC AND DIFFERENTIAL
HCT: 42 (ref 36–46)
Hemoglobin: 14.1 (ref 12.0–16.0)
Platelets: 251 (ref 150–399)
WBC: 4.6

## 2019-03-19 LAB — VITAMIN D 25 HYDROXY (VIT D DEFICIENCY, FRACTURES): Vit D, 25-Hydroxy: 31

## 2019-03-19 LAB — VITAMIN B12: Vitamin B-12: 231

## 2019-04-01 ENCOUNTER — Other Ambulatory Visit: Payer: Self-pay

## 2019-04-01 ENCOUNTER — Ambulatory Visit (INDEPENDENT_AMBULATORY_CARE_PROVIDER_SITE_OTHER): Payer: Medicare Other | Admitting: Family Medicine

## 2019-04-01 ENCOUNTER — Encounter: Payer: Self-pay | Admitting: Family Medicine

## 2019-04-01 VITALS — Ht 63.5 in | Wt 115.0 lb

## 2019-04-01 DIAGNOSIS — M8580 Other specified disorders of bone density and structure, unspecified site: Secondary | ICD-10-CM

## 2019-04-01 DIAGNOSIS — N83209 Unspecified ovarian cyst, unspecified side: Secondary | ICD-10-CM | POA: Insufficient documentation

## 2019-04-01 DIAGNOSIS — J302 Other seasonal allergic rhinitis: Secondary | ICD-10-CM

## 2019-04-01 DIAGNOSIS — R42 Dizziness and giddiness: Secondary | ICD-10-CM | POA: Diagnosis not present

## 2019-04-01 DIAGNOSIS — E7800 Pure hypercholesterolemia, unspecified: Secondary | ICD-10-CM

## 2019-04-01 DIAGNOSIS — E559 Vitamin D deficiency, unspecified: Secondary | ICD-10-CM

## 2019-04-01 DIAGNOSIS — Z1159 Encounter for screening for other viral diseases: Secondary | ICD-10-CM

## 2019-04-01 DIAGNOSIS — Z114 Encounter for screening for human immunodeficiency virus [HIV]: Secondary | ICD-10-CM

## 2019-04-01 DIAGNOSIS — R5383 Other fatigue: Secondary | ICD-10-CM

## 2019-04-01 DIAGNOSIS — Z9189 Other specified personal risk factors, not elsewhere classified: Secondary | ICD-10-CM

## 2019-04-01 DIAGNOSIS — N951 Menopausal and female climacteric states: Secondary | ICD-10-CM | POA: Insufficient documentation

## 2019-04-01 DIAGNOSIS — K9049 Malabsorption due to intolerance, not elsewhere classified: Secondary | ICD-10-CM

## 2019-04-01 DIAGNOSIS — N3010 Interstitial cystitis (chronic) without hematuria: Secondary | ICD-10-CM

## 2019-04-01 DIAGNOSIS — E039 Hypothyroidism, unspecified: Secondary | ICD-10-CM

## 2019-04-01 NOTE — Progress Notes (Signed)
Virtual Visit via Video   Due to the COVID-19 pandemic, this visit was completed with telemedicine (audio/video) technology to reduce patient and provider exposure as well as to preserve personal protective equipment.  I connected with Jocelyn Parker by a video enabled telemedicine application and verified that I am speaking with the correct person using two identifiers. Location patient: Home Location provider: [facility], Office Persons participating in the virtual visit: Sevyn, Markham, DO   I discussed the limitations of evaluation and management by telemedicine and the availability of in person appointments. The patient expressed understanding and agreed to proceed.  Care Team   Patient Care Team: Briscoe Deutscher, DO as PCP - General (Family Medicine) Jacelyn Pi, MD as Referring Physician (Endocrinology) Ronald Lobo, MD as Consulting Physician (Gastroenterology)  Subjective:   HPI: Patient presents for NEW PATIENT appointment.   See Assessment and Plan section for Problem Based Charting of issues discussed today.  Review of Systems  Constitutional: Negative for chills, fever, malaise/fatigue and weight loss.  Respiratory: Negative for cough, shortness of breath and wheezing.   Cardiovascular: Negative for chest pain, palpitations and leg swelling.  Gastrointestinal: Negative for abdominal pain, constipation, diarrhea, nausea and vomiting.  Genitourinary: Negative for dysuria and urgency.  Musculoskeletal: Negative for joint pain and myalgias.  Skin: Negative for rash.  Neurological: Positive for dizziness and headaches. Negative for tremors, speech change, focal weakness, seizures and loss of consciousness.  Psychiatric/Behavioral: Negative for depression, substance abuse and suicidal ideas. The patient is not nervous/anxious.     Patient Active Problem List   Diagnosis Date Noted  . Cyst of ovary 04/01/2019  . Menopausal symptom 04/01/2019  .  Osteopenia 04/01/2019  . History of hysterectomy 09/02/2017  . Family history of early CAD 03/18/2017  . Right flank pain, chronic 01/02/2016  . Vitamin D deficiency 05/24/2015  . 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 Cyst of kidney, acquired 12/20/2014  . Recurrent UTI 12/20/2014  . Irritable bowel syndrome 01/03/2014  . Hematuria, unspecified 09/07/2012  . Telogen effluvium 04/10/2012  . Thyroid disease   . Interstitial cystitis (chronic) without hematuria 03/20/2012  . Bilateral fibrocystic breast disease (FCBD) 03/20/2012  . Hypothyroidism 08/16/2008  . Hyperlipidemia, unspecified 08/16/2008  . Nontoxic multinodular goiter 08/08/2007    Social History   Tobacco Use  . Smoking status: Never Smoker  . Smokeless tobacco: Never Used  Substance Use Topics  . Alcohol use: No    Current Outpatient Medications:  .  estradiol (VIVELLE-DOT) 0.1 MG/24HR, Place 1 patch (0.1 mg total) onto the skin 2 (two) times a week., Disp: 8 patch, Rfl: 12 .  fluticasone (FLONASE) 50 MCG/ACT nasal spray, fluticasone propionate 50 mcg/actuation nasal spray,suspension  SHAKE LQ AND U 2 SPRAYS IEN D, Disp: , Rfl:  .  ibuprofen (ADVIL) 800 MG tablet, ibuprofen 800 mg tablet, Disp: , Rfl:  .  levothyroxine (SYNTHROID) 50 MCG tablet, Take 50 mcg by mouth daily before breakfast., Disp: , Rfl:  .  levothyroxine (SYNTHROID, LEVOTHROID) 75 MCG tablet, Take 75 mcg by mouth daily.  , Disp: , Rfl:  .  magnesium 30 MG tablet, Take by mouth., Disp: , Rfl:  .  meclizine (ANTIVERT) 12.5 MG tablet, meclizine 12.5 mg tablet, Disp: , Rfl:  .  ondansetron (ZOFRAN ODT) 8 MG disintegrating tablet, 8mg  ODT q4 hours prn nausea, Disp: 6 tablet, Rfl: 0 .  UNABLE TO FIND, Med Name: CBD Cream, Disp: , Rfl:  .  Meth-Hyo-M Bl-Na Phos-Ph Sal (URIBEL) 118  MG CAPS, Take 1 capsule (118 mg total) by mouth 4 (four) times daily., Disp: 120 capsule, Rfl: 0  Allergies  Allergen Reactions  . Sulfa Antibiotics Hives  . Montelukast Sodium      dizziness   Objective:   VITALS: Per patient if applicable, see vitals. GENERAL: Alert, appears well and in no acute distress. HEENT: Atraumatic, conjunctiva clear, no obvious abnormalities on inspection of external nose and ears. NECK: Normal movements of the head and neck. CARDIOPULMONARY: No increased WOB. Speaking in clear sentences. I:E ratio WNL.  MS: Moves all visible extremities without noticeable abnormality. PSYCH: Pleasant and cooperative, well-groomed. Speech normal rate and rhythm. Affect is appropriate. Insight and judgement are appropriate. Attention is focused, linear, and appropriate.  NEURO: CN grossly intact. Oriented as arrived to appointment on time with no prompting. Moves both UE equally.  SKIN: No obvious lesions, wounds, erythema, or cyanosis noted on face or hands.  Depression screen [facility] 2/9 04/02/2019  Decreased Interest 0  Down, Depressed, Hopeless 0  PHQ - 2 Score 0  Altered sleeping 0  Tired, decreased energy 2  Change in appetite 3  Feeling bad or failure about yourself  0  Trouble concentrating 0  Moving slowly or fidgety/restless 0  Suicidal thoughts 0  PHQ-9 Score 5  Difficult doing work/chores Not difficult at all    Assessment and Plan:   1. Encounter for screening for HIV - HIV Antibody (routine testing w rflx); Future  2. Encounter for hepatitis C virus screening test for high risk patient - Hepatitis C antibody; Future  3. Dizziness, weight loss. Five pound weight loss. Normal weight 119-120. No at 110-115. No edema. Feeling that she is eating normally. Lots of food intolerance. 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 GI is Buccini, last colonoscopy at age 4. Hx of cyst in right breast with plans to remove, Dr. Berdine Addison. Followed by [name] for thyroid, with normal between 1-2. Hx of inuslin resistance. States that she is "not a good eater."  - CBC with Differential/Platelet; Future - Comprehensive metabolic panel;  Future - TSH; Future - Vitamin B12; Future - Magnesium  4. Acquired hypothyroidism Patient denies diarrhea, heat / cold intolerance and palpitations.  Lab Results  Component Value Date   TSH 1.01 04/05/2019   TSH 1.981 04/09/2011   No results found for: FREET4   - levothyroxine (SYNTHROID) 50 MCG tablet; Take 50 mcg by mouth daily before breakfast.  5. Pure hypercholesterolemia Trying to exercise on a regular basis? Yes. Compliant with diet? Yes.  Lab Results  Component Value Date   CHOL 277 (H) 04/05/2019   HDL 62.00 04/05/2019   LDLCALC 194 (H) 04/05/2019   TRIG 103.0 04/05/2019   CHOLHDL 4 04/05/2019   Lab Results  Component Value Date   ALT 20 04/05/2019   AST 17 04/05/2019   ALKPHOS 55 04/05/2019   BILITOT 0.5 04/05/2019     - Comprehensive metabolic panel; Future - Lipid panel; Future  6. Osteopenia, unspecified location Vitamin D > 50.  7. Seasonal allergies - fluticasone (FLONASE) 50 MCG/ACT nasal spray; fluticasone propionate 50 mcg/actuation nasal spray  8. Vertigo Hx of and uses Antivert prn. - meclizine (ANTIVERT) 12.5 MG tablet; meclizine 12.5 mg tablet  9. Lightheadedness - CBC with Differential/Platelet; Future - TSH; Future  10. Vitamin D deficiency - VITAMIN D 25 Hydroxy (Vit-D Deficiency, Fractures); Future  . COVID-19 Education: The signs and symptoms of COVID-19 were discussed with the patient and how to  seek care for testing if needed. The importance of social distancing was discussed today. . Reviewed expectations re: course of current medical issues. . Discussed self-management of symptoms. . Outlined signs and symptoms indicating need for more acute intervention. . Patient verbalized understanding and all questions were answered. Marland Kitchen Health Maintenance issues including appropriate healthy diet, exercise, and smoking avoidance were discussed with patient. . See orders for this visit as documented in the electronic medical  record.  Briscoe Deutscher, DO  Records requested if needed. Time spent: 45 minutes, of which >50% was spent in obtaining information about her symptoms, reviewing her previous labs, evaluations, and treatments, counseling her about her condition (please see the discussed topics above), and developing a plan to further investigate it; she had a number of questions which I addressed.

## 2019-04-02 ENCOUNTER — Ambulatory Visit: Payer: BC Managed Care – PPO | Admitting: Family Medicine

## 2019-04-04 ENCOUNTER — Encounter: Payer: Self-pay | Admitting: Family Medicine

## 2019-04-05 ENCOUNTER — Other Ambulatory Visit: Payer: Self-pay

## 2019-04-05 ENCOUNTER — Other Ambulatory Visit (INDEPENDENT_AMBULATORY_CARE_PROVIDER_SITE_OTHER): Payer: Medicare Other

## 2019-04-05 DIAGNOSIS — Z9189 Other specified personal risk factors, not elsewhere classified: Secondary | ICD-10-CM

## 2019-04-05 DIAGNOSIS — Z1159 Encounter for screening for other viral diseases: Secondary | ICD-10-CM

## 2019-04-05 DIAGNOSIS — E559 Vitamin D deficiency, unspecified: Secondary | ICD-10-CM

## 2019-04-05 DIAGNOSIS — E7800 Pure hypercholesterolemia, unspecified: Secondary | ICD-10-CM | POA: Diagnosis not present

## 2019-04-05 DIAGNOSIS — R5383 Other fatigue: Secondary | ICD-10-CM | POA: Diagnosis not present

## 2019-04-05 DIAGNOSIS — Z114 Encounter for screening for human immunodeficiency virus [HIV]: Secondary | ICD-10-CM | POA: Diagnosis not present

## 2019-04-05 DIAGNOSIS — R42 Dizziness and giddiness: Secondary | ICD-10-CM | POA: Diagnosis not present

## 2019-04-05 LAB — COMPREHENSIVE METABOLIC PANEL
ALT: 20 U/L (ref 0–35)
AST: 17 U/L (ref 0–37)
Albumin: 4 g/dL (ref 3.5–5.2)
Alkaline Phosphatase: 55 U/L (ref 39–117)
BUN: 12 mg/dL (ref 6–23)
CO2: 32 mEq/L (ref 19–32)
Calcium: 9.3 mg/dL (ref 8.4–10.5)
Chloride: 102 mEq/L (ref 96–112)
Creatinine, Ser: 0.73 mg/dL (ref 0.40–1.20)
GFR: 96.46 mL/min (ref >60.00)
Glucose, Bld: 73 mg/dL (ref 70–99)
Potassium: 4.5 mEq/L (ref 3.5–5.1)
Sodium: 139 mEq/L (ref 135–145)
Total Bilirubin: 0.5 mg/dL (ref 0.2–1.2)
Total Protein: 6.8 g/dL (ref 6.0–8.3)

## 2019-04-05 LAB — TSH: TSH: 1.01 u[IU]/mL (ref 0.35–4.50)

## 2019-04-05 LAB — CBC WITH DIFFERENTIAL/PLATELET
Basophils Absolute: 0 10*3/uL (ref 0.0–0.1)
Basophils Relative: 0.6 % (ref 0.0–3.0)
Eosinophils Absolute: 0 10*3/uL (ref 0.0–0.7)
Eosinophils Relative: 0.5 % (ref 0.0–5.0)
HCT: 41.5 % (ref 36.0–46.0)
Hemoglobin: 14.2 g/dL (ref 12.0–15.0)
Lymphocytes Relative: 30.3 % (ref 12.0–46.0)
Lymphs Abs: 1.3 10*3/uL (ref 0.7–4.0)
MCHC: 34.3 g/dL (ref 30.0–36.0)
MCV: 96.4 fl (ref 78.0–100.0)
Monocytes Absolute: 0.4 10*3/uL (ref 0.1–1.0)
Monocytes Relative: 8.5 % (ref 3.0–12.0)
Neutro Abs: 2.6 10*3/uL (ref 1.4–7.7)
Neutrophils Relative %: 60.1 % (ref 43.0–77.0)
Platelets: 278 10*3/uL (ref 150.0–400.0)
RBC: 4.3 Mil/uL (ref 3.87–5.11)
RDW: 14.1 % (ref 11.5–15.5)
WBC: 4.3 10*3/uL (ref 4.0–10.5)

## 2019-04-05 LAB — LIPID PANEL
Cholesterol: 277 mg/dL — ABNORMAL HIGH (ref 0–200)
HDL: 62 mg/dL (ref >39.00)
LDL Cholesterol: 194 mg/dL — ABNORMAL HIGH (ref 0–99)
NonHDL: 214.57
Total CHOL/HDL Ratio: 4
Triglycerides: 103 mg/dL (ref 0.0–149.0)
VLDL: 20.6 mg/dL (ref 0.0–40.0)

## 2019-04-05 LAB — VITAMIN D 25 HYDROXY (VIT D DEFICIENCY, FRACTURES): VITD: 54.89 ng/mL (ref 30.00–100.00)

## 2019-04-05 LAB — VITAMIN B12: Vitamin B-12: 158 pg/mL — ABNORMAL LOW (ref 211–911)

## 2019-04-06 LAB — HEPATITIS C ANTIBODY
Hepatitis C Ab: NONREACTIVE
SIGNAL TO CUT-OFF: 0.01 (ref <1.00)

## 2019-04-06 LAB — HIV ANTIBODY (ROUTINE TESTING W REFLEX): HIV 1&2 Ab, 4th Generation: NONREACTIVE

## 2019-04-07 ENCOUNTER — Other Ambulatory Visit: Payer: Self-pay

## 2019-04-07 DIAGNOSIS — H814 Vertigo of central origin: Secondary | ICD-10-CM

## 2019-04-07 DIAGNOSIS — R42 Dizziness and giddiness: Secondary | ICD-10-CM

## 2019-04-08 ENCOUNTER — Other Ambulatory Visit: Payer: Self-pay

## 2019-04-08 ENCOUNTER — Ambulatory Visit (HOSPITAL_COMMUNITY)
Admission: RE | Admit: 2019-04-08 | Discharge: 2019-04-08 | Disposition: A | Discharge status: Home / Self Care | Payer: Medicare Other | Source: Ambulatory Visit | Attending: Family Medicine | Admitting: Family Medicine

## 2019-04-08 DIAGNOSIS — R42 Dizziness and giddiness: Secondary | ICD-10-CM | POA: Diagnosis present

## 2019-04-08 DIAGNOSIS — H814 Vertigo of central origin: Secondary | ICD-10-CM

## 2019-04-08 NOTE — Progress Notes (Signed)
Jocelyn Parker is a 68 y.o. female is here for follow up.  Assessment and Plan:   Hyperlipidemia, LDL 194, HDL 62 Lab Results  Component Value Date   CHOL 277 (H) 04/05/2019   HDL 62.00 04/05/2019   LDLCALC 194 (H) 04/05/2019   TRIG 103.0 04/05/2019   CHOLHDL 4 04/05/2019   Lab Results  Component Value Date   ALT 20 04/05/2019   AST 17 04/05/2019   ALKPHOS 55 04/05/2019   BILITOT 0.5 04/05/2019   The 10-year ASCVD risk score Jocelyn Parker DC Jr., et al., 2013) is: 9.4%   Values used to calculate the score:     Age: 57 years     Sex: Female     Is Non-Hispanic African American: Yes     Diabetic: No     Tobacco smoker: No     Systolic Blood Pressure: 468 mmHg     Is BP treated: No     HDL Cholesterol: 62 mg/dL     Total Cholesterol: 277 mg/dL  Cardiovascular event risk at 9.4% Will work on risk factors, including LDL. Okay to offer Cardiac CT.  B12 deficiency Patient brings in labs from previous visit with Neurology. Hx of the same. Discussed benefit v risk of treatment. Hx of IC flare with supplement. Patient wonders if red dye may be contributor. She is willing to try again. Will give injection today and Rx Uribel if she develops symptoms over the weekend. Will check in on Monday. Such a low baseline will require ongoing injections and patient is aware. Consider Elmiron.   Hypothyroidism, followed by Dr. [name] Lab Results  Component Value Date   TSH 1.01 04/05/2019    Lab Results  Component Value Date   TSH 1.01 04/05/2019   TSH 1.981 04/09/2011     Interstitial cystitis (chronic) without hematuria See B12 plan. Will monitor closely.   Orders Placed This Encounter  Procedures  . Referral to Nutrition and Diabetes Services  . EKG 12-Lead   Meds ordered this encounter  Medications  . Meth-Hyo-M Bl-Na Phos-Ph Sal (URIBEL) 118 MG CAPS    Sig: Take 1 capsule (118 mg total) by mouth 4 (four) times daily.    Dispense:  120 capsule    Refill:  0  . cyanocobalamin  ((VITAMIN B-12)) injection 1,000 mcg   Health Maintenance:   Health Maintenance Due  Topic Date Due  . MAMMOGRAM  01/25/2003  . COLONOSCOPY  01/25/2003  . DEXA SCAN  01/24/2018  . PNA vac Low Risk Adult (1 of 2 - PCV13) 01/24/2018  . TETANUS/TDAP  07/22/2018   Depression screen [facility] 2/9 04/02/2019  Decreased Interest 0  Down, Depressed, Hopeless 0  PHQ - 2 Score 0  Altered sleeping 0  Tired, decreased energy 2  Change in appetite 3  Feeling bad or failure about yourself  0  Trouble concentrating 0  Moving slowly or fidgety/restless 0  Suicidal thoughts 0  PHQ-9 Score 5  Difficult doing work/chores Not difficult at all   PMHx, SurgHx, SocialHx, FamHx, Medications, and Allergies were reviewed in the Visit Navigator and updated as appropriate.   Patient Active Problem List   Diagnosis Date Noted  . B12 deficiency 04/11/2019  . Cardiovascular event risk at 9.4% 04/11/2019  . Cyst of ovary 04/01/2019  . Menopausal symptom 04/01/2019  . Osteopenia 04/01/2019  . History of hysterectomy 09/02/2017  . Family history of early CAD 03/18/2017  . Right flank pain, chronic 01/02/2016  . Vitamin D deficiency 05/24/2015  .  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 Cyst of kidney, acquired 12/20/2014  . Recurrent UTI 12/20/2014  . Irritable bowel syndrome 01/03/2014  . Hematuria, unspecified 09/07/2012  . Telogen effluvium 04/10/2012  . Interstitial cystitis (chronic) without hematuria 03/20/2012  . Bilateral fibrocystic breast disease (FCBD) 03/20/2012  . Hypothyroidism, followed by Dr. [name] 08/16/2008  . Hyperlipidemia, LDL 194, HDL 62 08/16/2008  . Nontoxic multinodular goiter 08/08/2007   Social History   Tobacco Use  . Smoking status: Never Smoker  . Smokeless tobacco: Never Used  Substance Use Topics  . Alcohol use: No  . Drug use: No   Current Medications and Allergies:   .  estradiol (VIVELLE-DOT) 0.1 MG/24HR, Place 1 patch (0.1 mg total) onto the skin 2 (two) times a  week., Disp: 8 patch, Rfl: 12 .  fluticasone (FLONASE) 50 MCG/ACT nasal spray, fluticasone propionate 50 mcg/actuation nasal spray,suspension  SHAKE LQ AND U 2 SPRAYS IEN D, Disp: , Rfl:  .  ibuprofen (ADVIL) 800 MG tablet, ibuprofen 800 mg tablet, Disp: , Rfl:  .  levothyroxine (SYNTHROID) 50 MCG tablet, Take 50 mcg by mouth daily before breakfast., Disp: , Rfl:  .  levothyroxine (SYNTHROID, LEVOTHROID) 75 MCG tablet, Take 75 mcg by mouth daily.  , Disp: , Rfl:  .  magnesium 30 MG tablet, Take by mouth., Disp: , Rfl:  .  meclizine (ANTIVERT) 12.5 MG tablet, meclizine 12.5 mg tablet, Disp: , Rfl:  .  ondansetron (ZOFRAN ODT) 8 MG disintegrating tablet, 8mg  ODT q4 hours prn nausea, Disp: 6 tablet, Rfl: 0 .  UNABLE TO FIND, Med Name: CBD Cream, Disp: , Rfl:    Allergies  Allergen Reactions  . Sulfa Antibiotics Hives  . Montelukast Sodium     dizziness   Review of Systems   Pertinent items are noted in the HPI. Otherwise, ROS is negative.  Vitals:   Vitals:   04/09/19 1334  BP: 118/76  Pulse: 69  Temp: 98.1 F (36.7 C)  TempSrc: Oral  SpO2: 99%  Weight: 114 lb (51.7 kg)  Height: 5' 2.5" (1.588 m)     Body mass index is 20.52 kg/m. Physical Exam:   General: Cooperative, alert and oriented, well developed, well nourished, in no acute distress. HEENT: Pupils equal round reactive light and extraocular movements intact. Conjunctivae and lids unremarkable. No pallor or cyanosis, dentition good. Neck: No thyromegaly.  Cardiovascular: Regular rhythm. No murmurs appreciated.  Lungs: Normal work of breathing. Clear bilaterally without rales, rhonchi, or wheezing.  Abdomen: Soft, nontender, no masses. Normal bowel sounds. Extremities: No clubbing, cyanosis, erythema. No edema.  Skin: Warm and dry. Neurologic: No focal deficits.  Psychiatric: Normal affect and thought content.   EKG: normal EKG, normal sinus rhythm.   . Reviewed expectations re: course of current medical  issues. . Discussed self-management of symptoms. . Outlined signs and symptoms indicating need for more acute intervention. . Patient verbalized understanding and all questions were answered. Marland Kitchen Health Maintenance issues including appropriate healthy diet, exercise, and smoking avoidance were discussed with patient. . See orders for this visit as documented in the electronic medical record. . Patient received an After Visit Summary.  Briscoe Deutscher, DO [place], [facility] 04/11/2019

## 2019-04-09 ENCOUNTER — Encounter: Payer: Self-pay | Admitting: Family Medicine

## 2019-04-09 ENCOUNTER — Ambulatory Visit (INDEPENDENT_AMBULATORY_CARE_PROVIDER_SITE_OTHER): Payer: Medicare Other | Admitting: Family Medicine

## 2019-04-09 VITALS — BP 118/76 | HR 69 | Temp 98.1°F | Ht 62.5 in | Wt 114.0 lb

## 2019-04-09 DIAGNOSIS — E7800 Pure hypercholesterolemia, unspecified: Secondary | ICD-10-CM

## 2019-04-09 DIAGNOSIS — R42 Dizziness and giddiness: Secondary | ICD-10-CM | POA: Diagnosis not present

## 2019-04-09 DIAGNOSIS — E538 Deficiency of other specified B group vitamins: Secondary | ICD-10-CM | POA: Diagnosis not present

## 2019-04-09 DIAGNOSIS — Z9189 Other specified personal risk factors, not elsewhere classified: Secondary | ICD-10-CM

## 2019-04-09 DIAGNOSIS — E039 Hypothyroidism, unspecified: Secondary | ICD-10-CM

## 2019-04-09 DIAGNOSIS — H814 Vertigo of central origin: Secondary | ICD-10-CM | POA: Diagnosis not present

## 2019-04-09 DIAGNOSIS — N3010 Interstitial cystitis (chronic) without hematuria: Secondary | ICD-10-CM

## 2019-04-09 DIAGNOSIS — R634 Abnormal weight loss: Secondary | ICD-10-CM

## 2019-04-09 DIAGNOSIS — E042 Nontoxic multinodular goiter: Secondary | ICD-10-CM

## 2019-04-09 MED ORDER — CYANOCOBALAMIN 1000 MCG/ML IJ SOLN
1000.0000 ug | Freq: Once | INTRAMUSCULAR | Status: AC
  Administered 2019-04-09: 1000 ug via INTRAMUSCULAR

## 2019-04-09 MED ORDER — URIBEL 118 MG PO CAPS: 1.0000 | ORAL_CAPSULE | Freq: Four times a day (QID) | ORAL | 0 refills | Status: DC

## 2019-04-11 ENCOUNTER — Encounter: Payer: Self-pay | Admitting: Family Medicine

## 2019-04-11 DIAGNOSIS — E538 Deficiency of other specified B group vitamins: Secondary | ICD-10-CM | POA: Insufficient documentation

## 2019-04-11 DIAGNOSIS — Z9189 Other specified personal risk factors, not elsewhere classified: Secondary | ICD-10-CM | POA: Insufficient documentation

## 2019-04-11 NOTE — Assessment & Plan Note (Signed)
Lab Results  Component Value Date   TSH 1.01 04/05/2019    Lab Results  Component Value Date   TSH 1.01 04/05/2019   TSH 1.981 04/09/2011

## 2019-04-11 NOTE — Assessment & Plan Note (Signed)
Patient brings in labs from previous visit with Neurology. Hx of the same. Discussed benefit v risk of treatment. Hx of IC flare with supplement. Patient wonders if red dye may be contributor. She is willing to try again. Will give injection today and Rx Uribel if she develops symptoms over the weekend. Will check in on Monday. Such a low baseline will require ongoing injections and patient is aware. Consider Elmiron.

## 2019-04-11 NOTE — Assessment & Plan Note (Addendum)
Will work on risk factors, including LDL. Okay to offer Cardiac CT.

## 2019-04-11 NOTE — Assessment & Plan Note (Signed)
See B12 plan. Will monitor closely.

## 2019-04-11 NOTE — Assessment & Plan Note (Signed)
Lab Results  Component Value Date   CHOL 277 (H) 04/05/2019   HDL 62.00 04/05/2019   LDLCALC 194 (H) 04/05/2019   TRIG 103.0 04/05/2019   CHOLHDL 4 04/05/2019   Lab Results  Component Value Date   ALT 20 04/05/2019   AST 17 04/05/2019   ALKPHOS 55 04/05/2019   BILITOT 0.5 04/05/2019   The 10-year ASCVD risk score [name] DC Jr., et al., 2013) is: 9.4%   Values used to calculate the score:     Age: 68 years     Sex: Female     Is Non-Hispanic African American: Yes     Diabetic: No     Tobacco smoker: No     Systolic Blood Pressure: 703 mmHg     Is BP treated: No     HDL Cholesterol: 62 mg/dL     Total Cholesterol: 277 mg/dL

## 2019-04-15 ENCOUNTER — Telehealth: Payer: Self-pay

## 2019-04-15 ENCOUNTER — Ambulatory Visit (INDEPENDENT_AMBULATORY_CARE_PROVIDER_SITE_OTHER): Payer: Medicare Other

## 2019-04-15 ENCOUNTER — Other Ambulatory Visit: Payer: Self-pay

## 2019-04-15 DIAGNOSIS — E538 Deficiency of other specified B group vitamins: Secondary | ICD-10-CM

## 2019-04-15 MED ORDER — "SYRINGE 25G X 1"" 3 ML MISC": 1.0000 "application " | 0 refills | Status: DC

## 2019-04-15 MED ORDER — CYANOCOBALAMIN 1000 MCG/ML IJ SOLN
1000.0000 ug | Freq: Once | INTRAMUSCULAR | Status: AC
  Administered 2019-04-15: 1000 ug via INTRAMUSCULAR

## 2019-04-15 MED ORDER — CYANOCOBALAMIN 1000 MCG/ML IJ SOLN: INTRAMUSCULAR | 15 refills | Status: DC

## 2019-04-15 NOTE — Telephone Encounter (Signed)
Received call from Dr. Berdine Addison from [facility] he wanted to let office know that she would like to have B-12 done daily. I have called patient and made app for her to come in the office today and she will get injection and then will do next at home. She will do 1 a day for 4 days then one a week for 4 weeks.

## 2019-04-15 NOTE — Progress Notes (Signed)
Per orders of Dr. Juleen China, injection of B-12 given by Francella Solian in left deltoid. Patient tolerated injection well. Patient was instructed on how to give injections at home. Provided with copy of instructions as well as photos of where able to give it. She will call with any questions. We have called in all supplies to pharmacy.

## 2019-04-19 ENCOUNTER — Encounter: Payer: Self-pay | Admitting: Physician Assistant

## 2019-04-24 ENCOUNTER — Other Ambulatory Visit: Payer: Self-pay | Admitting: Family Medicine

## 2019-04-24 DIAGNOSIS — J302 Other seasonal allergic rhinitis: Secondary | ICD-10-CM

## 2019-04-28 ENCOUNTER — Telehealth: Payer: Self-pay | Admitting: Family Medicine

## 2019-04-28 NOTE — Telephone Encounter (Signed)
Please advise if pt can be seen in office this week. Pt wants to eliminate "blood clot".   Copied from [place] [id number]. Topic: Appointment Scheduling - Scheduling Inquiry for Clinic >> Apr 28, 2019 10:21 AM Burchel, Abbi R wrote: [facility] called to request an appt for pt w/Dr Juleen China re: swelling in rt leg post surgery.  Please call pt to schedule: [phone].

## 2019-04-28 NOTE — Telephone Encounter (Signed)
We do not have any in office appointments left this week. Please see if Dr. Jonni Sanger can see or if patient can possibly be seen at [facility].  Thanks!

## 2019-04-29 ENCOUNTER — Telehealth: Payer: Self-pay | Admitting: Family Medicine

## 2019-04-29 NOTE — Telephone Encounter (Signed)
Called Jocelyn Parker to advise. There was miscommunication. Jocelyn Parker stated she just wanted to know if a scan can be done without having an appt with the dr. Abbott Pao was upset when I called offering appt with Dr., Jonni Sanger. Jocelyn Parker stated she called her surgeon and they are setting up a scan for her. Nothing further needed at this time.

## 2019-04-29 NOTE — Telephone Encounter (Signed)
[id number] - [address], [address] AT [facility] [address] [phone] (Phone) [phone] (Fax)    Pharmacist  In need of clarity regarding cyanocobalamin (,VITAMIN B-12,) 1000 MCG/ML injection. Patient informed pharmacy they frequency is wrong therefore pharmacy would like to confirm, please advise

## 2019-04-29 NOTE — Telephone Encounter (Signed)
Called pharmacy and spoke to [place] clarified Rx is correct as ordered. Harlon Ditty verbalized understanding and said they filled it as ordered but pt said incorrect. Told her okay I will call and let pt know the correct directions. Harlon Ditty verbalized understanding and said she will reach out to pt too and let her know Rx is ready.  Called pt and told her I clarified directions with JoEllen, Dr. Alcario Drought nurse and you are suppose to do B12 injection daily x 4 days and once weekly x 4 weeks then monthly. Pt verbalized understanding and said that is not what her and Dr. Juleen China discussed originally and she will My Chart her to discuss it. Told her okay.

## 2019-05-11 ENCOUNTER — Telehealth: Payer: Self-pay | Admitting: Physical Therapy

## 2019-05-11 NOTE — Telephone Encounter (Signed)
Copied from [place] [id number]. Topic: General - Other >> May 11, 2019 10:34 AM Virl Axe D wrote: Reason for CRM: Pt returned call regarding appt tomorrow. Advised to send CRM. Pt would also like to have labs done to check B12. Requesting orders so she can have them done prior to appt.

## 2019-05-11 NOTE — Telephone Encounter (Signed)
Called patient let her know that we like to hold off on doing labs until office visit to make sure no issues.

## 2019-05-12 ENCOUNTER — Other Ambulatory Visit: Payer: Self-pay

## 2019-05-12 ENCOUNTER — Encounter: Payer: Self-pay | Admitting: Family Medicine

## 2019-05-12 ENCOUNTER — Ambulatory Visit: Payer: Medicare Other | Admitting: Family Medicine

## 2019-05-12 VITALS — BP 124/62 | HR 64 | Temp 98.6°F | Ht 62.5 in | Wt 115.0 lb

## 2019-05-12 DIAGNOSIS — Z9189 Other specified personal risk factors, not elsewhere classified: Secondary | ICD-10-CM

## 2019-05-12 DIAGNOSIS — E7800 Pure hypercholesterolemia, unspecified: Secondary | ICD-10-CM

## 2019-05-12 DIAGNOSIS — H9313 Tinnitus, bilateral: Secondary | ICD-10-CM

## 2019-05-12 DIAGNOSIS — H9193 Unspecified hearing loss, bilateral: Secondary | ICD-10-CM

## 2019-05-12 DIAGNOSIS — E538 Deficiency of other specified B group vitamins: Secondary | ICD-10-CM | POA: Diagnosis not present

## 2019-05-12 LAB — LIPID PANEL
Cholesterol: 255 mg/dL — ABNORMAL HIGH (ref 0–200)
HDL: 51 mg/dL (ref >39.00)
LDL Cholesterol: 185 mg/dL — ABNORMAL HIGH (ref 0–99)
NonHDL: 204.36
Total CHOL/HDL Ratio: 5
Triglycerides: 99 mg/dL (ref 0.0–149.0)
VLDL: 19.8 mg/dL (ref 0.0–40.0)

## 2019-05-12 LAB — COMPREHENSIVE METABOLIC PANEL
ALT: 12 U/L (ref 0–35)
AST: 15 U/L (ref 0–37)
Albumin: 3.9 g/dL (ref 3.5–5.2)
Alkaline Phosphatase: 52 U/L (ref 39–117)
BUN: 14 mg/dL (ref 6–23)
CO2: 29 mEq/L (ref 19–32)
Calcium: 8.7 mg/dL (ref 8.4–10.5)
Chloride: 103 mEq/L (ref 96–112)
Creatinine, Ser: 0.74 mg/dL (ref 0.40–1.20)
GFR: 94.92 mL/min (ref >60.00)
Glucose, Bld: 85 mg/dL (ref 70–99)
Potassium: 4.2 mEq/L (ref 3.5–5.1)
Sodium: 140 mEq/L (ref 135–145)
Total Bilirubin: 0.5 mg/dL (ref 0.2–1.2)
Total Protein: 6.4 g/dL (ref 6.0–8.3)

## 2019-05-12 LAB — VITAMIN B12: Vitamin B-12: 1025 pg/mL — ABNORMAL HIGH (ref 211–911)

## 2019-05-12 NOTE — Progress Notes (Signed)
Tressie Ragin is a 68 y.o. female is here for follow up.  History of Present Illness:   HPI:   1. B12 deficiency Lab Results  Component Value Date   VITAMINB12 1,025 (H) 05/12/2019   2. Pure hypercholesterolemia, not on statin Lab Results  Component Value Date   CHOL 255 (H) 05/12/2019   HDL 51.00 05/12/2019   LDLCALC 185 (H) 05/12/2019   TRIG 99.0 05/12/2019   CHOLHDL 5 05/12/2019   Lab Results  Component Value Date   ALT 12 05/12/2019   AST 15 05/12/2019   ALKPHOS 52 05/12/2019   BILITOT 0.5 05/12/2019   3. Cardiovascular event risk at 9.4% The 10-year ASCVD risk score [name] DC Brooke Bonito., et al., 2013) is: 8.7%   Values used to calculate the score:     Age: 68 years     Sex: Female     Is Non-Hispanic African American: Yes     Diabetic: No     Tobacco smoker: No     Systolic Blood Pressure: 921 mmHg     Is BP treated: No     HDL Cholesterol: 51 mg/dL     Total Cholesterol: 255 mg/dL  4. Tinnitus of both ears With decreased hearing in both ears.  Ongoing but seems worse.  Interested in further evaluation of possible intervention.  5. At risk for weight loss Weight is steady to increasing at this time.  We will continue to monitor carefully.  Wt Readings from Last 3 Encounters:  05/26/19 118 lb (53.5 kg)  05/12/19 115 lb (52.2 kg)  04/09/19 114 lb (51.7 kg)   Health Maintenance Due  Topic Date Due  . MAMMOGRAM  01/25/2003  . COLONOSCOPY  01/25/2003  . DEXA SCAN  01/24/2018  . PNA vac Low Risk Adult (1 of 2 - PCV13) 01/24/2018  . TETANUS/TDAP  07/22/2018   Depression screen PHQ 2/9 04/02/2019  Decreased Interest 0  Down, Depressed, Hopeless 0  PHQ - 2 Score 0  Altered sleeping 0  Tired, decreased energy 2  Change in appetite 3  Feeling bad or failure about yourself  0  Trouble concentrating 0  Moving slowly or fidgety/restless 0  Suicidal thoughts 0  PHQ-9 Score 5  Difficult doing work/chores Not difficult at all   PMHx, SurgHx, SocialHx, FamHx,  Medications, and Allergies were reviewed in the Visit Navigator and updated as appropriate.   Patient Active Problem List   Diagnosis Date Noted  . Solitary pulmonary nodule on lung CT 05/26/2019  . Tinnitus of both ears 05/12/2019  . B12 deficiency 04/11/2019  . Cardiovascular event risk at 9.4% 04/11/2019  . Cyst of ovary 04/01/2019  . Menopausal symptom 04/01/2019  . Osteopenia 04/01/2019  . History of hysterectomy 09/02/2017  . Family history of early CAD 03/18/2017  . Right flank pain, chronic 01/02/2016  . Vitamin D deficiency 05/24/2015  . 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 Cyst of kidney, acquired 12/20/2014  . Recurrent UTI 12/20/2014  . Irritable bowel syndrome 01/03/2014  . Hematuria, unspecified 09/07/2012  . Telogen effluvium 04/10/2012  . Interstitial cystitis (chronic) without hematuria 03/20/2012  . Bilateral fibrocystic breast disease (FCBD) 03/20/2012  . Hypothyroidism, followed by Dr. [name] 08/16/2008  . Hyperlipidemia, LDL 194, HDL 62 08/16/2008  . Nontoxic multinodular goiter 08/08/2007   Social History   Tobacco Use  . Smoking status: Never Smoker  . Smokeless tobacco: Never Used  Substance Use Topics  . Alcohol use: No  . Drug use: No  Current Medications and Allergies   Current Outpatient Medications:  .  cyanocobalamin (,VITAMIN B-12,) 1000 MCG/ML injection, 1000 mcg (1 mg) injection once per day for four days, followed by 1000 mcg injection once weekly for 4 weeks, Disp: 1 mL, Rfl: 15 .  estradiol (VIVELLE-DOT) 0.1 MG/24HR, Place 1 patch (0.1 mg total) onto the skin 2 (two) times a week., Disp: 8 patch, Rfl: 12 .  ibuprofen (ADVIL) 800 MG tablet, ibuprofen 800 mg tablet, Disp: , Rfl:  .  levothyroxine (SYNTHROID) 50 MCG tablet, Take 50 mcg by mouth daily before breakfast., Disp: , Rfl:  .  levothyroxine (SYNTHROID, LEVOTHROID) 75 MCG tablet, Take 75 mcg by mouth daily.  , Disp: , Rfl:  .  magnesium 30 MG tablet, Take by mouth., Disp: , Rfl:  .   meclizine (ANTIVERT) 12.5 MG tablet, meclizine 12.5 mg tablet, Disp: , Rfl:  .  Meth-Hyo-M Bl-Na Phos-Ph Sal (URIBEL) 118 MG CAPS, Take 1 capsule (118 mg total) by mouth 4 (four) times daily., Disp: 120 capsule, Rfl: 0 .  ondansetron (ZOFRAN ODT) 8 MG disintegrating tablet, 8mg  ODT q4 hours prn nausea, Disp: 6 tablet, Rfl: 0 .  Syringe/Needle, Disp, (SYRINGE 3CC/25GX1") 25G X 1" 3 ML MISC, 1 application by Does not apply route once a week., Disp: 12 each, Rfl: 0 .  UNABLE TO FIND, Med Name: CBD Cream, Disp: , Rfl:  .  fluticasone (FLONASE) 50 MCG/ACT nasal spray, SHAKE LIQUID AND USE 2 SPRAYS IN EACH NOSTRIL DAILY, Disp: 16 g, Rfl: 2   Allergies  Allergen Reactions  . Sulfa Antibiotics Hives  . Montelukast Sodium     dizziness   Review of Systems   Pertinent items are noted in the HPI. Otherwise, a complete ROS is negative.  Vitals   Vitals:   05/12/19 0933  BP: 124/62  Pulse: 64  Temp: 98.6 F (37 C)  TempSrc: Oral  SpO2: 99%  Weight: 115 lb (52.2 kg)  Height: 5' 2.5" (1.588 m)     Body mass index is 20.7 kg/m.  Physical Exam   Physical Exam Vitals signs and nursing note reviewed.  HENT:     Head: Normocephalic and atraumatic.  Eyes:     Pupils: Pupils are equal, round, and reactive to light.  Neck:     Musculoskeletal: Normal range of motion and neck supple.  Cardiovascular:     Rate and Rhythm: Normal rate and regular rhythm.     Heart sounds: Normal heart sounds.  Pulmonary:     Effort: Pulmonary effort is normal.  Abdominal:     Palpations: Abdomen is soft.  Skin:    General: Skin is warm.  Psychiatric:        Behavior: Behavior normal.    Results for orders placed or performed in visit on 05/12/19  Vitamin B12  Result Value Ref Range   Vitamin B-12 1,025 (H) 211 - 911 pg/mL  Comprehensive metabolic panel  Result Value Ref Range   Sodium 140 135 - 145 mEq/L   Potassium 4.2 3.5 - 5.1 mEq/L   Chloride 103 96 - 112 mEq/L   CO2 29 19 - 32 mEq/L    Glucose, Bld 85 70 - 99 mg/dL   BUN 14 6 - 23 mg/dL   Creatinine, Ser 0.74 0.40 - 1.20 mg/dL   Total Bilirubin 0.5 0.2 - 1.2 mg/dL   Alkaline Phosphatase 52 39 - 117 U/L   AST 15 0 - 37 U/L   ALT 12 0 - 35  U/L   Total Protein 6.4 6.0 - 8.3 g/dL   Albumin 3.9 3.5 - 5.2 g/dL   Calcium 8.7 8.4 - 10.5 mg/dL   GFR 94.92 >60.00 mL/min  Lipid panel  Result Value Ref Range   Cholesterol 255 (H) 0 - 200 mg/dL   Triglycerides 99.0 0.0 - 149.0 mg/dL   HDL 51.00 >39.00 mg/dL   VLDL 19.8 0.0 - 40.0 mg/dL   LDL Cholesterol 185 (H) 0 - 99 mg/dL   Total CHOL/HDL Ratio 5    NonHDL 204.36    Assessment and Plan   Nalani was seen today for follow-up.  Diagnoses and all orders for this visit:  B12 deficiency -     Vitamin B12 -     Vitamin B12; Future -     Syringe/Needle, Disp, (SYRINGE 3CC/25GX1") 25G X 1" 3 ML MISC; 1 application by Does not apply route once a week. -     cyanocobalamin (,VITAMIN B-12,) 1000 MCG/ML injection; 1000 mcg (1 mg) injection once per day for four days, followed by 1000 mcg injection once weekly for 4 weeks  Pure hypercholesterolemia -     Comprehensive metabolic panel; Future -     Lipid panel; Future -     Comprehensive metabolic panel -     Lipid panel  Cardiovascular event risk at 9.4% -     CT CARDIAC SCORING; Future  Tinnitus of both ears -     Ambulatory referral to Audiology  At risk for weight loss  Decreased hearing of both ears   . Orders and follow up as documented in [place], reviewed diet, exercise and weight control, cardiovascular risk and specific lipid/LDL goals reviewed, reviewed medications and side effects in detail.  . Reviewed expectations re: course of current medical issues. . Outlined signs and symptoms indicating need for more acute intervention. . Patient verbalized understanding and all questions were answered. . Patient received an After Visit Summary.  Briscoe Deutscher, DO [place], [facility] 05/30/2019

## 2019-05-14 MED ORDER — CYANOCOBALAMIN 1000 MCG/ML IJ SOLN: INTRAMUSCULAR | 15 refills | Status: DC

## 2019-05-14 MED ORDER — "SYRINGE 25G X 1"" 3 ML MISC": 1.0000 "application " | 0 refills | Status: DC

## 2019-05-21 ENCOUNTER — Other Ambulatory Visit: Payer: Self-pay

## 2019-05-21 DIAGNOSIS — R9389 Abnormal findings on diagnostic imaging of other specified body structures: Secondary | ICD-10-CM

## 2019-05-23 ENCOUNTER — Other Ambulatory Visit: Payer: Self-pay | Admitting: Family Medicine

## 2019-05-23 DIAGNOSIS — J302 Other seasonal allergic rhinitis: Secondary | ICD-10-CM

## 2019-05-26 ENCOUNTER — Ambulatory Visit: Payer: Medicare Other | Admitting: Internal Medicine

## 2019-05-26 ENCOUNTER — Other Ambulatory Visit: Payer: Self-pay

## 2019-05-26 ENCOUNTER — Encounter: Payer: Self-pay | Admitting: Internal Medicine

## 2019-05-26 DIAGNOSIS — C3491 Malignant neoplasm of unspecified part of right bronchus or lung: Secondary | ICD-10-CM | POA: Insufficient documentation

## 2019-05-26 DIAGNOSIS — R911 Solitary pulmonary nodule: Secondary | ICD-10-CM

## 2019-05-26 DIAGNOSIS — Z9189 Other specified personal risk factors, not elsewhere classified: Secondary | ICD-10-CM | POA: Diagnosis not present

## 2019-05-26 LAB — SEDIMENTATION RATE: Sed Rate: 8 mm/hr (ref 0–30)

## 2019-05-26 NOTE — Patient Instructions (Signed)
Please remember to go to the lab department   for your tests - we will call you with the results when they are available.      We will call to set up your follow up ct scan in three months and if any growth > excisional biopsy  But if the same or smaller > follow the Fleischner society guidelines.

## 2019-05-26 NOTE — Progress Notes (Signed)
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 Pulmonary Consult    Referred by Dr. Briscoe Deutscher for eval of abnormal CT Chest.    Dyspnea:  Not limited by breathing from desired activities   Cough: some sinus problems using nasal spray daily new this year helps  Sleep: ok x for nasal congestion  SABA use: no   No obvious day to day or daytime variability or assoc excess/ purulent sputum or mucus plugs or hemoptysis or cp or chest tightness, subjective wheeze or overt sinus or hb symptoms.   Sleeping ok  without nocturnal  or early am exacerbation  of respiratory  c/o's or need for noct saba. Also denies any obvious fluctuation of symptoms with weather or environmental changes or other aggravating or alleviating factors except as outlined above   No unusual exposure hx or h/o childhood pna/ asthma or knowledge of premature birth.  Current Allergies, Complete Past Medical History, Past Surgical History, Family History, and Social History were reviewed in [employer] record.  ROS  The following are not active complaints unless bolded Hoarseness, sore throat, dysphagia, dental problems, itching, sneezing,  nasal congestion or discharge of excess mucus or purulent secretions, ear ache,   fever, chills, sweats, unintended wt loss or wt gain, classically pleuritic or exertional cp,  orthopnea pnd or arm/hand swelling  or leg swelling, presyncope, palpitations, abdominal pain, ?anorexia(pt denies but husband concerned not eating right),  nausea, vomiting, diarrhea  or change in bowel habits or change in bladder habits, change in stools or change in urine, dysuria, hematuria,  rash, arthralgias, visual complaints, headache, numbness, weakness or ataxia or problems with walking or coordination,  change in mood or  memory.            Past Medical History:  Diagnosis Date  . Allergy    sulfa  . Chronic interstitial cystitis   . Thyroid disease     Outpatient Medications Prior to Visit  Medication Sig Dispense Refill  . cyanocobalamin (,VITAMIN B-12,) 1000 MCG/ML injection 1000 mcg (1 mg) injection once per day for four days, followed by 1000 mcg injection once weekly for 4 weeks 1 mL 15  . estradiol (VIVELLE-DOT) 0.1 MG/24HR Place 1 patch (0.1 mg total) onto the skin 2 (two) times a week. 8 patch 12  . fluticasone (FLONASE) 50 MCG/ACT nasal spray SHAKE LIQUID AND USE 2 SPRAYS IN EACH NOSTRIL DAILY 16 g 2  . ibuprofen (ADVIL) 800 MG tablet ibuprofen 800 mg tablet    . levothyroxine (SYNTHROID) 50 MCG tablet Take 50 mcg by mouth daily before breakfast.    . levothyroxine (SYNTHROID, LEVOTHROID) 75 MCG tablet Take 75 mcg by mouth daily.      . magnesium 30 MG tablet Take by mouth.    . meclizine (ANTIVERT) 12.5 MG tablet meclizine 12.5 mg tablet    . Meth-Hyo-M Bl-Na Phos-Ph Sal (URIBEL) 118 MG CAPS Take 1 capsule (118 mg total) by mouth 4 (four) times daily. 120 capsule 0  .  ondansetron (ZOFRAN ODT) 8 MG disintegrating tablet 8mg  ODT q4 hours prn nausea 6 tablet 0  . Syringe/Needle, Disp, (SYRINGE 3CC/25GX1") 25G X 1" 3 ML MISC 1 application by Does not apply route once a week. 12 each 0  . UNABLE TO FIND Med Name: CBD Cream           Objective:     BP 116/72 (BP Location: Left Arm, Cuff Size: Normal)   Pulse 72   Temp 97.9 F (36.6 C) (Oral)   Ht 5\' 3"  (1.6 m)   Wt 118 lb (53.5 kg)   SpO2 99% Comment: on RA  BMI 20.90 kg/m   SpO2: 99 %(on RA)    Pleasant but anxious amb bf nad   HEENT: nl dentition,  turbinates bilaterally, and oropharynx. Nl external ear canals without cough reflex   NECK :  without JVD/Nodes/TM/ nl carotid upstrokes bilaterally   LUNGS: no acc muscle use,  Nl contour chest which is clear to A and P bilaterally without cough on insp or exp maneuvers   CV:  RRR  no s3 or murmur or increase in P2, and no edema   ABD:  soft and nontender with nl inspiratory excursion in the supine position. No bruits or organomegaly appreciated, bowel sounds nl  MS:  Nl gait/ ext warm without deformities, calf tenderness, cyanosis or clubbing No obvious joint restrictions   SKIN: warm and dry without lesions    NEURO:  alert, approp, nl sensorium with  no motor or cerebellar deficits apparent.      I personally reviewed images and agree with radiology impression as follows:  sinus CT  04/08/19 Sinuses/Orbits: No acute finding.  Chest CT [facility]  05/19/19  1.5 cm "smudge" area at very apex of R Lung    Labs ordered 05/26/2019    [facility]     Lab Results  Component Value Date   ESRSEDRATE 8 05/26/2019        Assessment   No problem-specific Assessment & Plan notes found for this encounter.     Christinia Gully, MD 05/26/2019

## 2019-05-27 ENCOUNTER — Encounter: Payer: Self-pay | Admitting: Internal Medicine

## 2019-05-27 NOTE — Assessment & Plan Note (Addendum)
Chest CT [facility]  05/19/19  1.5 cm "smudge" area at very apex of R Lung  - Spirometry nl 05/26/2019 @ [facility]  - Quant Tb 05/26/2019 pending  - Rec fepeat CT chest 08/19/19 (placed in reminder file) and if any growth > excisional bx, if no growth then continue to follow Fleischner society guidelines for semi-solid nodules    CT results reviewed with pt >>> Too small for accurate/ useful PET for non-solid tumors bx, not suspicious enough for excisional bx > really only option for now is follow the Fleischner society guidelines as rec by radiology.    Discussed in detail all the  indications, usual  risks and alternatives  relative to the benefits with patient who agrees to proceed with w/u as outlined.      Total time devoted to counseling  > 50 % of initial 60 min office visit:  review case with pt/husband by speaker phone/ discussion of options/alternatives/ personally creating written customized instructions  in presence of pt  then going over those specific  Instructions directly with the pt including how to use all of the meds but in particular covering each new medication in detail and the difference between the maintenance= "automatic" meds and the prns using an action plan format for the latter (If this problem/symptom => do that organization reading Left to right).  Please see AVS from this visit for a full list of these instructions which I personally wrote for this pt and  are unique to this visit.

## 2019-05-28 LAB — QUANTIFERON-TB GOLD PLUS
Mitogen-NIL: 7 IU/mL
NIL: 0.02 IU/mL
QuantiFERON-TB Gold Plus: NEGATIVE
TB1-NIL: 0.01 IU/mL
TB2-NIL: 0 IU/mL

## 2019-05-30 ENCOUNTER — Encounter: Payer: Self-pay | Admitting: Family Medicine

## 2019-06-08 ENCOUNTER — Telehealth: Payer: Self-pay | Admitting: Family Medicine

## 2019-06-08 NOTE — Telephone Encounter (Signed)
See note  Copied from [place] [id number]. Topic: General - Other >> Jun 08, 2019  4:21 PM Rainey Pines A wrote: Patient would like a callback from Dr. Juleen China nurse in regards to if she should continue with her upcoming CT scan due to a large amount of radiation in a small period of time,

## 2019-06-09 ENCOUNTER — Telehealth: Payer: Self-pay | Admitting: Family Medicine

## 2019-06-09 NOTE — Telephone Encounter (Signed)
Notes faxed to number provided.

## 2019-06-09 NOTE — Telephone Encounter (Signed)
Please advise if you think she should hold off

## 2019-06-09 NOTE — Telephone Encounter (Signed)
See note  Copied from [place] [id number]. Topic: General - Other >> Jun 09, 2019 12:55 PM Yvette Rack wrote: Reason for CRM: Lovey Newcomer with Dr. Gavin Potters office at [facility] called to request office notes from 05/12/19 visit. Sandy asked that the office notes be faxed to [phone]. Sandy's cb# [phone]

## 2019-06-10 NOTE — Telephone Encounter (Signed)
received call from CT that patient cancelled app due to not receiving question answer from our office. Called let patient know that I will call as soon as we get answer from you we will call. She also wanted to make sure that you are aware that she has one that has to be done in October.

## 2019-06-11 ENCOUNTER — Inpatient Hospital Stay: Admission: RE | Admit: 2019-06-11 | Payer: Medicare Other | Source: Ambulatory Visit

## 2019-06-11 NOTE — Telephone Encounter (Signed)
Called patient let her know that she does not have to get CT but it will not medically be issue if she decides to have done. She is not sure what pulmonologist that she will be seeing in future she does not know what one she would like to continue to see. She will call office and let us know when she decides.

## 2019-06-11 NOTE — Telephone Encounter (Signed)
Okay to hold and follow pulmonary nodule CT guidelines. Please clarify Pulmonologist - seeing two?

## 2019-06-20 DIAGNOSIS — H903 Sensorineural hearing loss, bilateral: Secondary | ICD-10-CM | POA: Insufficient documentation

## 2019-07-23 ENCOUNTER — Other Ambulatory Visit: Payer: Self-pay

## 2019-07-23 ENCOUNTER — Other Ambulatory Visit (INDEPENDENT_AMBULATORY_CARE_PROVIDER_SITE_OTHER): Payer: Medicare Other

## 2019-07-23 DIAGNOSIS — E538 Deficiency of other specified B group vitamins: Secondary | ICD-10-CM

## 2019-07-23 NOTE — Addendum Note (Signed)
Addended by: Tomi Likens on: 07/23/2019 03:31 PM   Modules accepted: Orders

## 2019-07-24 LAB — VITAMIN B12: Vitamin B-12: 1472 pg/mL — ABNORMAL HIGH (ref 200–1100)

## 2019-07-28 ENCOUNTER — Ambulatory Visit (INDEPENDENT_AMBULATORY_CARE_PROVIDER_SITE_OTHER): Payer: Medicare Other | Admitting: Family Medicine

## 2019-07-28 ENCOUNTER — Encounter: Payer: Self-pay | Admitting: Family Medicine

## 2019-07-28 ENCOUNTER — Other Ambulatory Visit: Payer: Self-pay

## 2019-07-28 VITALS — BP 120/62 | HR 72 | Temp 98.4°F | Ht 63.0 in | Wt 116.4 lb

## 2019-07-28 DIAGNOSIS — K9049 Malabsorption due to intolerance, not elsewhere classified: Secondary | ICD-10-CM | POA: Diagnosis not present

## 2019-07-28 DIAGNOSIS — E538 Deficiency of other specified B group vitamins: Secondary | ICD-10-CM

## 2019-07-28 DIAGNOSIS — E039 Hypothyroidism, unspecified: Secondary | ICD-10-CM | POA: Diagnosis not present

## 2019-07-28 DIAGNOSIS — R911 Solitary pulmonary nodule: Secondary | ICD-10-CM

## 2019-07-28 DIAGNOSIS — E559 Vitamin D deficiency, unspecified: Secondary | ICD-10-CM

## 2019-07-28 MED ORDER — CYANOCOBALAMIN 1000 MCG/ML IJ SOLN: INTRAMUSCULAR | 15 refills | Status: DC

## 2019-07-28 NOTE — Progress Notes (Signed)
Johnella Crumm is a 68 y.o. female is here for follow up.  History of Present Illness:   Lonell Grandchild, CMA acting as scribe for Dr. Briscoe Deutscher.   HPI:  Incidental pulmonary nodule found on CT last month.  Visited with pulmonology and due for repeat CT scan 3 months from original.  Patient denies chest pain, shortness of breath, cough, smoking history.  Weight has stabilized.  She maintains around 115 pounds right now.  Prior to losing weight she was consistently around 120 pounds.  Patient has issues with eating gluten and other specific foods due to causing interstitial cystitis flares.  Recent vitamin D level was 15 per patient report.  Last year it was around 40.  Followed by the breast center for fibrocystic changes.  Saw the audiologist for tinnitus which seems to be worsening somewhat.  Per patient, she was told that it was related to stress.  Patient is interested in a multivitamin but needs it for it to be gluten and dye free.  She also asks the same for vitamin D supplement.  Thyroid nodule rechecked and was stable.  Health Maintenance Due  Topic Date Due  . MAMMOGRAM  01/25/2003  . COLONOSCOPY  01/25/2003  . DEXA SCAN  01/24/2018  . PNA vac Low Risk Adult (1 of 2 - PCV13) 01/24/2018  . TETANUS/TDAP  07/22/2018   Depression screen PHQ 2/9 07/28/2019 04/02/2019  Decreased Interest 0 0  Down, Depressed, Hopeless 0 0  PHQ - 2 Score 0 0  Altered sleeping 1 0  Tired, decreased energy 1 2  Change in appetite 0 3  Feeling bad or failure about yourself  0 0  Trouble concentrating 0 0  Moving slowly or fidgety/restless 0 0  Suicidal thoughts 0 0  PHQ-9 Score 2 5  Difficult doing work/chores Not difficult at all Not difficult at all   PMHx, SurgHx, SocialHx, FamHx, Medications, and Allergies were reviewed in the Visit Navigator and updated as appropriate.   Patient Active Problem List   Diagnosis Date Noted  . Sensorineural hearing loss (SNHL) of both ears 06/20/2019  .  Solitary pulmonary nodule on lung CT 05/26/2019  . Tinnitus of both ears 05/12/2019  . B12 deficiency 04/11/2019  . Cardiovascular event risk at 9.4% 04/11/2019  . Cyst of ovary 04/01/2019  . Menopausal symptom 04/01/2019  . Osteopenia 04/01/2019  . History of hysterectomy 09/02/2017  . Family history of early CAD 03/18/2017  . Right flank pain, chronic 01/02/2016  . Vitamin D deficiency 05/24/2015  . 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 Cyst of kidney, acquired 12/20/2014  . Recurrent UTI 12/20/2014  . Irritable bowel syndrome 01/03/2014  . Hematuria, unspecified 09/07/2012  . Telogen effluvium 04/10/2012  . Interstitial cystitis (chronic) without hematuria 03/20/2012  . Bilateral fibrocystic breast disease (FCBD) 03/20/2012  . Hypothyroidism, followed by Dr. [name] 08/16/2008  . Hyperlipidemia, LDL 194, HDL 62 08/16/2008  . Nontoxic multinodular goiter 08/08/2007   Social History   Tobacco Use  . Smoking status: Never Smoker  . Smokeless tobacco: Never Used  Substance Use Topics  . Alcohol use: No  . Drug use: No   Current Medications and Allergies   Current Outpatient Medications:  .  cyanocobalamin (,VITAMIN B-12,) 1000 MCG/ML injection, 1000 mcg (1 mg) injection once per day for four days, followed by 1000 mcg injection once weekly for 4 weeks, Disp: 1 mL, Rfl: 15 .  estradiol (VIVELLE-DOT) 0.1 MG/24HR, Place 1 patch (0.1 mg total) onto the skin  2 (two) times a week., Disp: 8 patch, Rfl: 12 .  fluticasone (FLONASE) 50 MCG/ACT nasal spray, SHAKE LIQUID AND USE 2 SPRAYS IN EACH NOSTRIL DAILY, Disp: 16 g, Rfl: 2 .  ibuprofen (ADVIL) 800 MG tablet, ibuprofen 800 mg tablet, Disp: , Rfl:  .  levothyroxine (SYNTHROID) 50 MCG tablet, Take 50 mcg by mouth daily before breakfast., Disp: , Rfl:  .  levothyroxine (SYNTHROID, LEVOTHROID) 75 MCG tablet, Take 75 mcg by mouth daily.  , Disp: , Rfl:  .  magnesium 30 MG tablet, Take by mouth., Disp: , Rfl:  .  meclizine (ANTIVERT) 12.5 MG  tablet, meclizine 12.5 mg tablet, Disp: , Rfl:  .  Meth-Hyo-M Bl-Na Phos-Ph Sal (URIBEL) 118 MG CAPS, Take 1 capsule (118 mg total) by mouth 4 (four) times daily., Disp: 120 capsule, Rfl: 0 .  ondansetron (ZOFRAN ODT) 8 MG disintegrating tablet, 8mg  ODT q4 hours prn nausea, Disp: 6 tablet, Rfl: 0 .  Syringe/Needle, Disp, (SYRINGE 3CC/25GX1") 25G X 1" 3 ML MISC, 1 application by Does not apply route once a week., Disp: 12 each, Rfl: 0 .  UNABLE TO FIND, Med Name: CBD Cream, Disp: , Rfl:    Allergies  Allergen Reactions  . Sulfa Antibiotics Hives  . Montelukast Sodium     dizziness   Review of Systems   Pertinent items are noted in the HPI. Otherwise, a complete ROS is negative.  Vitals   Vitals:   07/28/19 0833  BP: 120/62  Pulse: 72  Temp: 98.4 F (36.9 C)  TempSrc: Temporal  SpO2: 94%  Weight: 116 lb 6.4 oz (52.8 kg)  Height: 5\' 3"  (1.6 m)     Body mass index is 20.62 kg/m.  Physical Exam   Physical Exam Vitals signs and nursing note reviewed.  HENT:     Head: Normocephalic and atraumatic.  Eyes:     Pupils: Pupils are equal, round, and reactive to light.  Neck:     Musculoskeletal: Normal range of motion and neck supple.  Cardiovascular:     Rate and Rhythm: Normal rate and regular rhythm.     Heart sounds: Normal heart sounds.  Pulmonary:     Effort: Pulmonary effort is normal.  Abdominal:     Palpations: Abdomen is soft.  Skin:    General: Skin is warm.  Psychiatric:        Behavior: Behavior normal.     Results for orders placed or performed in visit on 07/23/19  B12  Result Value Ref Range   Vitamin B-12 1,472 (H) 200 - 1,100 pg/mL    Assessment and Plan   Janese was seen today for follow-up.  Diagnoses and all orders for this visit:  Food intolerance Comments: Patient looking for MVM - dye, gluten free, vegan. Will see if I can help with this.   B12 deficiency Comments: Now supplemented. Okay to space out injections.  Orders: -      cyanocobalamin (,VITAMIN B-12,) 1000 MCG/ML injection; 1000 mcg (1 mg) injection once per day for four days, followed by 1000 mcg injection once weekly for 4 weeks  Solitary pulmonary nodule on lung CT Comments: Patient wants to f/u with Pulmonology at [facility].   Acquired hypothyroidism  Vitamin D deficiency   . Orders and follow up as documented in [place], reviewed diet, exercise and weight control, cardiovascular risk and specific lipid/LDL goals reviewed, reviewed medications and side effects in detail.  . Reviewed expectations re: course of current medical issues. . Outlined signs  and symptoms indicating need for more acute intervention. . Patient verbalized understanding and all questions were answered. . Patient received an After Visit Summary.   Briscoe Deutscher, DO [place], [facility] 08/01/2019

## 2019-08-01 ENCOUNTER — Encounter: Payer: Self-pay | Admitting: Family Medicine

## 2019-08-03 NOTE — Telephone Encounter (Signed)
Copied from [place] [id number]. Topic: General - Inquiry >> Aug 03, 2019  4:36 PM Reyne Dumas L wrote: Reason for CRM:   Pt calling.  She would like to know the specific name of the CT test that Dr. Juleen China has ordered for her.

## 2019-08-09 ENCOUNTER — Other Ambulatory Visit: Payer: Self-pay

## 2019-08-09 DIAGNOSIS — E559 Vitamin D deficiency, unspecified: Secondary | ICD-10-CM

## 2019-08-11 ENCOUNTER — Other Ambulatory Visit (INDEPENDENT_AMBULATORY_CARE_PROVIDER_SITE_OTHER): Payer: Medicare Other

## 2019-08-11 DIAGNOSIS — E559 Vitamin D deficiency, unspecified: Secondary | ICD-10-CM

## 2019-08-11 LAB — VITAMIN D 25 HYDROXY (VIT D DEFICIENCY, FRACTURES): VITD: 50.63 ng/mL (ref 30.00–100.00)

## 2019-08-12 ENCOUNTER — Telehealth: Payer: Self-pay | Admitting: *Deleted

## 2019-08-12 ENCOUNTER — Other Ambulatory Visit: Payer: Medicare Other

## 2019-08-12 NOTE — Telephone Encounter (Signed)
Spoke with the Jocelyn Parker  She states that Dr Juleen China had wanted to order a cardiac CT and then another doc wanted her to have a super D scan  She is now confused as of what she needs and wants to know what to do  Looks like there is also an order in for a cardiac ct under [facility]  Please advise thanks

## 2019-08-12 NOTE — Telephone Encounter (Signed)
No need for the super cd at this point - the plan was to refer to surgery if any growth and if not just follow for now.  A cardiac ct will give [place] lung windows but I don't think can be done at same location as prior CT so would be nice if we make sure the previous images are avail to radiology when they overread the ct caronary portion read by cards

## 2019-08-12 NOTE — Telephone Encounter (Signed)
Called and spoke with the pt  She was unable to speak at this time and will have to call back

## 2019-08-12 NOTE — Telephone Encounter (Signed)
-----   Message from Tanda Rockers, MD sent at 05/27/2019  6:26 AM EDT ----- Be sure she has been scheduled for ct s contrast for 08/19/19 for spn - was done at [facility] so should be repeated there

## 2019-08-13 ENCOUNTER — Telehealth: Payer: Self-pay

## 2019-08-13 NOTE — Telephone Encounter (Signed)
Copied from [place] [id number]. Topic: General - Other >> Aug 13, 2019  4:13 PM Alanda Slim E wrote: Reason for CRM: Pt would like a call back today about her vit D results / please advise

## 2019-08-15 ENCOUNTER — Encounter: Payer: Self-pay | Admitting: Family Medicine

## 2019-08-17 NOTE — Telephone Encounter (Signed)
lmtcb for pt to relay these recs.

## 2019-08-17 NOTE — Telephone Encounter (Signed)
Patient has viewed on mychart.

## 2019-08-18 NOTE — Telephone Encounter (Signed)
The vitamin D results obtained in our office were consistent. The outside lab a few weeks ago reported a lower number. Vitamin D is fat soluble, so you can increase the level pretty quickly with supplementation. I would consider this reassuring.

## 2019-08-23 NOTE — Telephone Encounter (Signed)
LMTCB

## 2019-08-24 ENCOUNTER — Telehealth: Payer: Self-pay | Admitting: Family Medicine

## 2019-08-24 NOTE — Telephone Encounter (Signed)
See note

## 2019-08-24 NOTE — Telephone Encounter (Signed)
Error

## 2019-08-24 NOTE — Telephone Encounter (Deleted)
Pt called in to request to have a updated CT CARDIAC SCORING, order placed, she said that the one that was placed in July needed to be reordered.   Also pt would like to know if provider could suggest a new PCP for her?    CB: [phone] - please advise pt directly

## 2019-08-24 NOTE — Telephone Encounter (Signed)
Pt called in to request to have a updated CT CARDIAC SCORING, order placed, she said that the one that was placed in July needed to be reordered.   Also pt would like to know if provider could suggest a new PCP for her?    CB: [phone] - please advise pt directly

## 2019-08-24 NOTE — Telephone Encounter (Signed)
Please advise on provider will place order and let pt know

## 2019-08-25 NOTE — Telephone Encounter (Signed)
I recommend Dr. Rogers Blocker. See if she would be open to ordering the test.

## 2019-08-25 NOTE — Telephone Encounter (Signed)
Hi Dr Jocelyn Parker is recommending you to this patient. She is requesting CT Cardiac scoring. Can I order under you so that you will get results or do you want to hold off until you see her first?

## 2019-08-26 NOTE — Telephone Encounter (Signed)
Called patient to give information. She became very aggressive. Wanted to know why she needed to have another appointment when test was already placed by doctor wallace. I tried to explain with patient that issue with her not being in our office after Friday. She would not let me explain. She continued to talk over me and argue with every thing I tried to say. I let patient know that I did not feel I was able to articulate the policy and why we can not order under Dr. Juleen China. She would like for me to pass this no to my boss because" she is not taking  what I have to say".

## 2019-08-26 NOTE — Telephone Encounter (Signed)
Id like to see her first to see if this is an appropriate test  As I usually let cardiology order this.  Orma Flaming, MD [place]

## 2019-08-30 NOTE — Telephone Encounter (Signed)
ATC, NA and no option this time to LM

## 2019-08-30 NOTE — Telephone Encounter (Signed)
Spoke to Jocelyn Parker in regards to below message. She is scheduled to virtually see Dr. Rogers Blocker on 11//2020

## 2019-09-02 ENCOUNTER — Encounter: Payer: Self-pay | Admitting: *Deleted

## 2019-09-02 NOTE — Telephone Encounter (Signed)
Letter mailed

## 2019-09-09 ENCOUNTER — Ambulatory Visit (INDEPENDENT_AMBULATORY_CARE_PROVIDER_SITE_OTHER): Payer: Medicare Other | Admitting: Family Medicine

## 2019-09-09 ENCOUNTER — Encounter: Payer: Self-pay | Admitting: Family Medicine

## 2019-09-09 VITALS — Temp 98.4°F | Ht 63.0 in | Wt 116.0 lb

## 2019-09-09 DIAGNOSIS — R911 Solitary pulmonary nodule: Secondary | ICD-10-CM

## 2019-09-09 DIAGNOSIS — E782 Mixed hyperlipidemia: Secondary | ICD-10-CM | POA: Diagnosis not present

## 2019-09-09 DIAGNOSIS — E559 Vitamin D deficiency, unspecified: Secondary | ICD-10-CM | POA: Diagnosis not present

## 2019-09-09 DIAGNOSIS — E538 Deficiency of other specified B group vitamins: Secondary | ICD-10-CM

## 2019-09-09 DIAGNOSIS — N3010 Interstitial cystitis (chronic) without hematuria: Secondary | ICD-10-CM | POA: Diagnosis not present

## 2019-09-09 NOTE — Progress Notes (Signed)
Patient: Jocelyn Parker MRN: [id number] DOB: 1951/03/22 PCP: Briscoe Deutscher, DO     I connected with Carl Best on 09/09/19 at 10:00am by a video enabled telemedicine application and verified that I am speaking with the correct person using two identifiers.  Location patient: Home Location provider: [facility], Office Persons participating in this virtual visit: Taylan Mayhan and Dr. Rogers Blocker   I discussed the limitations of evaluation and management by telemedicine and the availability of in person appointments. The patient expressed understanding and agreed to proceed.   Subjective:  Chief Complaint  Patient presents with  . Transitions Of Care    HPI: The patient is a 68 y.o. female who presents today for transferring care.  History, medication, medical history all reviewed in detail. She is followed by urology, pulmonology and endocrinology. I would be following her for her high cholesterol.   Hyperlipidemia: Her cholesterol is quite high. Her LDL is near 200 and her ASCVD risk is 8%. She does have a family hx of heart diesase in her father who had MI at age 41 years of age. She does not smoke and does not have diabetes.   Review of Systems  Constitutional: Negative for chills, fatigue and fever.  HENT: Negative for congestion, postnasal drip, rhinorrhea and sore throat.   Eyes: Negative for visual disturbance.  Respiratory: Negative for cough and shortness of breath.   Cardiovascular: Negative for chest pain, palpitations and leg swelling.  Gastrointestinal: Negative for abdominal pain, constipation, diarrhea, nausea and vomiting.  Endocrine: Positive for cold intolerance and heat intolerance. Negative for polydipsia and polyuria.  Genitourinary: Negative for dysuria, frequency and urgency.  Skin: Negative for rash.  Neurological: Negative for dizziness and headaches.  Psychiatric/Behavioral: Negative for sleep disturbance.    Allergies Patient is allergic to sulfa  antibiotics and montelukast sodium.  Past Medical History Patient  has a past medical history of Allergy, Chronic interstitial cystitis, and Thyroid disease.  Surgical History Patient  has a past surgical history that includes Abdominal hysterectomy and lumps removed from breast.  Family History Pateint's family history includes Cancer in her mother; Heart attack in her father and paternal grandfather; Stroke in her maternal grandmother.  Social History Patient  reports that she has never smoked. She has never used smokeless tobacco. She reports that she does not drink alcohol or use drugs.    Objective: Vitals:   09/09/19 0836  Temp: 98.4 F (36.9 C)  TempSrc: Skin  Weight: 116 lb (52.6 kg)  Height: 5\' 3"  (1.6 m)    Body mass index is 20.55 kg/m.  Physical Exam Vitals signs reviewed.  Constitutional:      Appearance: Normal appearance. She is normal weight.  HENT:     Head: Normocephalic and atraumatic.  Pulmonary:     Effort: Pulmonary effort is normal.  Neurological:     General: No focal deficit present.     Mental Status: She is alert and oriented to person, place, and time.    Depression screen [facility] 2/9 09/09/2019 07/28/2019 04/02/2019  Decreased Interest 0 0 0  Down, Depressed, Hopeless 0 0 0  PHQ - 2 Score 0 0 0  Altered sleeping 0 1 0  Tired, decreased energy 0 1 2  Change in appetite 0 0 3  Feeling bad or failure about yourself  0 0 0  Trouble concentrating 0 0 0  Moving slowly or fidgety/restless 0 0 0  Suicidal thoughts 0 0 0  PHQ-9 Score 0 2 5  Difficult  doing work/chores Not difficult at all Not difficult at all Not difficult at all   No flowsheet data found.        Assessment/plan: 1. Interstitial cystitis (chronic) without hematuria Followed by urology. Very sensitive about medications that she takes as she believes this sets off her IC.   2. Mixed hyperlipidemia Very high LDL with ascvd about 8%. She is on red yeast which I discussed lacks  evidence base and typically not recommended. With her high LDL and resistance to medication, recommended referral to Dr. Debara Pickett at lipid clinic. She wants to hold off on this right now.   3. Solitary pulmonary nodule on lung CT Followed by pulmonology.   4. Vitamin D deficiency -normal on repeat in 08/2019  5. B12 deficiency -overly supplemented. Decrease to once/month injections.    Return in about 2 months (around 11/09/2019) for exam/labs.  Records requested if needed. Time spent with patient: 25 minutes, of which >50% was spent in obtaining information about her symptoms, reviweing her previous labs, evaluations, and treatments, counseling her about her conditions (please see discussed topics above), and developing a plan to further investigate it; she had a number of questions which I addressed.    Orma Flaming, MD [place]  09/09/2019

## 2019-09-15 ENCOUNTER — Ambulatory Visit: Payer: Self-pay | Admitting: *Deleted

## 2019-09-15 NOTE — Telephone Encounter (Signed)
See note

## 2019-09-15 NOTE — Telephone Encounter (Signed)
I was a pt of Dr. Juleen China.   Now I'm with Dr. Rogers Blocker.   I   I tested last Friday and it was negative for COVID-19.  Someone in my office was COVID-19 positive.   I was retested and now I'm positive.   Not tested at a [facility] facility.   My whole office has been closed as a result.      Pt called in with many questions about COVID-19 and testing.   I answered her questions and gave reassurance.   I went over the 10 day quarantine protocol from the time her symptoms started after 10 days as long as her symptoms are resolving and she is fever free for 3 straight days without using Tylenol to keep the fever down she can end the quarantine.   Only symptoms she had was fever 101 on Sat that resolved on Sunday.   Also fatigue.    She is going on MyChart and let Dr. Rogers Blocker know she is positive for COVID-19.  I let her know that was a good idea.     I called Dr. Shelby Mattocks office and spoke with [place].   I let her know this pt had tested positive for the COVID-19 but she was not tested at a [facility] facility so the result is not in Epic.

## 2019-09-17 ENCOUNTER — Ambulatory Visit: Payer: Self-pay | Admitting: *Deleted

## 2019-09-17 NOTE — Telephone Encounter (Signed)
Message from [facility] sent at 09/17/2019 5:09 PM EST  Pt stated she was diagnosed with Covid-19 and she would like to know if she could take an OTC medication for acid reflux. Pt requests call back.

## 2019-09-17 NOTE — Telephone Encounter (Signed)
Patient calling with complaints of a burning sensation in her throat and wants to know if it is ok to take reflux medication with the diagnosis of COVID. Pt states that she has taken reflux medication before. Advised patient that she should still be able to take reflux medication if needed even with the diagnosis of COVID. Advised patient that she should also consult with the pharmacist.  Pt states that she does not have a sour test but more like a cigarette taste in her mouth and she does not smoke. Pt states she has had reflux in the past. Pt also states that she experiences SOB with talking a lot at times and has a tight feeling in chest with sneezing. No symptoms of chest pain or SOB at the time of call.  Advised patient to return the call if symptoms continue with the use of over the counter medications. Pt also advised that she should remain in quarantine at least 10 days since symptoms onset. Pt advised to seek treatment in the ED if SOB becomes worse or if chest pain occurs. Pt verbalized understanding.   Reason for Disposition . COVID-19 Disease, questions about  Protocols used: CORONAVIRUS (COVID-19) DIAGNOSED OR SUSPECTED-A-AH

## 2019-09-20 ENCOUNTER — Ambulatory Visit: Payer: Self-pay | Admitting: *Deleted

## 2019-09-20 NOTE — Telephone Encounter (Signed)
Please call and schedule virtual appointment.  

## 2019-09-20 NOTE — Telephone Encounter (Signed)
Patient has been diagnosed with Covid and she is experiencing her heart rate speeding up every time she gets up to do anything and it goes back down when she sits down and rest. She would like to talk to a nurse about these symptoms.    Pt has tested positive for covid and now is in her 9 th. She states her heart rate increases to 115 to 120 when she gets up and back down in the 80's when she sits down.  She gets a little short of breath when this happens. She denies fever or any other symptoms. She denies history of cardiac conditions. She did have body aches in the begioining but not now.  She does not know when this started.  She is advised of having a virtual appointment because she is positive of covid. [facility] notified regarding an appointment. Call conference in with the practice.  Reason for Disposition . Nursing judgment or information in reference  Answer Assessment - Initial Assessment Questions 1. REASON FOR CALL: "What is your main concern right now?"     Heart rated going up with activity. 2. ONSET: "When did the increase in heart rate start?"     Not sure 3. SEVERITY: "How bad is the is the increase in rate?"     Happens when she stands 4. FEVER: "Do you have a fever?"     no 5. OTHER SYMPTOMS: "Do you have any other new symptoms?"     no 6. INTERVENTIONS AND RESPONSE: "What have you done so far to try to make this better? What medications have you used?"     nothing 7. PREGNANCY: "Is there any chance you are pregnant?"     n/a  Protocols used: NO GUIDELINE AVAILABLE-A-AH

## 2019-09-21 ENCOUNTER — Ambulatory Visit (INDEPENDENT_AMBULATORY_CARE_PROVIDER_SITE_OTHER): Payer: Medicare Other | Admitting: Family Medicine

## 2019-09-21 ENCOUNTER — Encounter: Payer: Self-pay | Admitting: Family Medicine

## 2019-09-21 VITALS — HR 105

## 2019-09-21 DIAGNOSIS — R000 Tachycardia, unspecified: Secondary | ICD-10-CM

## 2019-09-21 NOTE — Progress Notes (Signed)
   Chief Complaint:  Jocelyn Parker is a 68 y.o. female who presents today for a virtual office visit with a chief complaint of elevated heart rate.   Assessment/Plan:  Elevated Heart Rate No red flags.  Discussed limitations of televisit and inability to perform physical exam or lab work with patient.  Recommended ED urgent care however patient declined.  She is recovering from COVID-19 and is not able to come to the office at this time.  She has likely having dehydration-she has not been able to keep up with oral intake and has been excessively sweating recently.  Advised patient to push oral fluids for the next couple of days and let us know symptoms are not improving.      Subjective:  HPI:  Elevated Heart Rate Started a few days ago.  Patient was diagnosed with covid 10 days ago.  Over the last couple of days, she has noticed that her heart rate will jump to the 110s and 120s after standing or exerting herself. No chest pain or shortness of breath. No dizziness. No syncope. She has been taking all of her medications as prescribed. Is not taking any OTC medications. She has been drinking and eating less due to heartburn. No other treatments tried. No other obvious aggravating or alleviating factors. Her lips have been dry.    ROS: Per HPI  PMH: She reports that she has never smoked. She has never used smokeless tobacco. She reports that she does not drink alcohol or use drugs.      Objective/Observations  Physical Exam: Gen: NAD, resting comfortably HEENT: Dry appearing mucus membranes  Pulm: Normal work of breathing Neuro: Grossly normal, moves all extremities Psych: Normal affect and thought content  Virtual Visit via Video   I connected with Jocelyn Parker on 09/21/19 at 11:20 AM EST by a video enabled telemedicine application and verified that I am speaking with the correct person using two identifiers. I discussed the limitations of evaluation and management by telemedicine and  the availability of in person appointments. The patient expressed understanding and agreed to proceed.   Patient location: Home Provider location: [employer] participating in the virtual visit: Myself and Patient  Time Spent: I spent >15 minutes face-to-face with the patient, with more than half spent on counseling for management plan for her elevated heart rate.      Algis Greenhouse. Jerline Pain, MD 09/21/2019 12:30 PM

## 2019-10-12 DIAGNOSIS — C3411 Malignant neoplasm of upper lobe, right bronchus or lung: Secondary | ICD-10-CM | POA: Insufficient documentation

## 2019-10-27 ENCOUNTER — Telehealth: Payer: Self-pay | Admitting: Family Medicine

## 2019-10-27 ENCOUNTER — Other Ambulatory Visit: Payer: Self-pay

## 2019-10-27 DIAGNOSIS — N3010 Interstitial cystitis (chronic) without hematuria: Secondary | ICD-10-CM

## 2019-10-27 DIAGNOSIS — R5383 Other fatigue: Secondary | ICD-10-CM

## 2019-10-27 DIAGNOSIS — Z20828 Contact with and (suspected) exposure to other viral communicable diseases: Secondary | ICD-10-CM

## 2019-10-27 DIAGNOSIS — R748 Abnormal levels of other serum enzymes: Secondary | ICD-10-CM

## 2019-10-27 NOTE — Telephone Encounter (Signed)
Pt asked if she could get an order for coronavirus antibodies test.

## 2019-10-27 NOTE — Telephone Encounter (Signed)
Spoke to patient and she is wanting to have covid antibodies test done, vitamin b12 level, magnesium level and Vitamin D.   She explained that she was taking a prescription strength Vitamin D and has completed that but cannot take OTC Vitamin D due to her interstitial cystitis.  Requesting to have this done as well.  The magnesium level was ordered back in May but patient never came in for it.  Advised patient I will ask Dr. Rogers Blocker to place orders for these tests.  Lab appt scheduled for 12/24.

## 2019-10-27 NOTE — Telephone Encounter (Signed)
See note  Copied from [place] [id number]. Topic: General - Other >> Oct 27, 2019  2:58 PM Rainey Pines A wrote: Patient looking to speak with nurse about getting antibody test ordered and to add additional labs to panel scheduled for 12/28

## 2019-10-28 ENCOUNTER — Other Ambulatory Visit (INDEPENDENT_AMBULATORY_CARE_PROVIDER_SITE_OTHER): Payer: Medicare Other

## 2019-10-28 ENCOUNTER — Other Ambulatory Visit: Payer: Self-pay

## 2019-10-28 DIAGNOSIS — Z20828 Contact with and (suspected) exposure to other viral communicable diseases: Secondary | ICD-10-CM

## 2019-10-28 DIAGNOSIS — R748 Abnormal levels of other serum enzymes: Secondary | ICD-10-CM

## 2019-10-28 DIAGNOSIS — N3010 Interstitial cystitis (chronic) without hematuria: Secondary | ICD-10-CM | POA: Diagnosis not present

## 2019-10-28 LAB — VITAMIN B12: Vitamin B-12: 1200 pg/mL — ABNORMAL HIGH (ref 211–911)

## 2019-10-28 LAB — VITAMIN D 25 HYDROXY (VIT D DEFICIENCY, FRACTURES): VITD: 38.86 ng/mL (ref 30.00–100.00)

## 2019-10-28 NOTE — Addendum Note (Signed)
Addended by: Francis Dowse T on: 10/28/2019 10:04 AM   Modules accepted: Orders

## 2019-10-29 LAB — SAR COV2 SEROLOGY (COVID19)AB(IGG),IA: SARS CoV2 AB IGG: POSITIVE — AB

## 2019-10-30 ENCOUNTER — Encounter: Payer: Self-pay | Admitting: Family Medicine

## 2019-11-01 ENCOUNTER — Other Ambulatory Visit: Payer: Medicare Other

## 2019-11-08 ENCOUNTER — Telehealth: Payer: Self-pay

## 2019-11-08 NOTE — Telephone Encounter (Signed)
Patient is requesting in office visit. We are completely booked until march 8th. Patient stated that dr Rogers Blocker would like for her to come into the office however, patient would like to be seen before March 8th appt.. Also patient have questions regarding injections and would like for a nurse to call her back.

## 2019-11-08 NOTE — Telephone Encounter (Signed)
Pt requested in office visit we are completed book for this month as I informed the patient she hung up

## 2019-11-09 NOTE — Telephone Encounter (Signed)
Spoke with patient and advised that we can see her for an OV on 1/18 @ 8 am.  Patient verbalized understanding.

## 2019-11-09 NOTE — Telephone Encounter (Signed)
Spoke to patient, actually scheduled her for OV for 1/11 @ 11:20 am.  1/18 slot was already taken.  Patient verbalized understanding.

## 2019-11-12 ENCOUNTER — Other Ambulatory Visit: Payer: Self-pay

## 2019-11-15 ENCOUNTER — Other Ambulatory Visit: Payer: Self-pay

## 2019-11-15 ENCOUNTER — Ambulatory Visit (INDEPENDENT_AMBULATORY_CARE_PROVIDER_SITE_OTHER): Payer: Medicare Other | Admitting: Family Medicine

## 2019-11-15 ENCOUNTER — Encounter: Payer: Self-pay | Admitting: Family Medicine

## 2019-11-15 VITALS — BP 108/75 | HR 75 | Temp 97.6°F | Ht 63.0 in | Wt 117.8 lb

## 2019-11-15 DIAGNOSIS — E559 Vitamin D deficiency, unspecified: Secondary | ICD-10-CM | POA: Diagnosis not present

## 2019-11-15 DIAGNOSIS — I700 Atherosclerosis of aorta: Secondary | ICD-10-CM | POA: Diagnosis not present

## 2019-11-15 DIAGNOSIS — E782 Mixed hyperlipidemia: Secondary | ICD-10-CM

## 2019-11-15 DIAGNOSIS — E538 Deficiency of other specified B group vitamins: Secondary | ICD-10-CM

## 2019-11-15 MED ORDER — ERGOCALCIFEROL 1.25 MG (50000 UT) PO CAPS: 50000.0000 [IU] | ORAL_CAPSULE | ORAL | 4 refills | Status: DC

## 2019-11-15 NOTE — Patient Instructions (Signed)
-  vitamin D sent in for you to take weekly (same pill as you had)  -will recheck d/b12 in 2 months. i'll future order so let me know if any other labs you think you may need and i'll double check as well.   -cardiology order placed for calcium test. They will call to set this up.   Nice to see you in person! Dr .Rogers Blocker

## 2019-11-15 NOTE — Progress Notes (Signed)
Patient: Jocelyn Parker MRN: [id number] DOB: 10-Aug-1951 PCP: Orma Flaming, MD     Subjective:  Chief Complaint  Patient presents with  . discuss Vitamin D level  . referral to cardiology    HPI: The patient is a 69 y.o. female who presents today for follow up on Vitamin D level, follow up and questions regarding CT calcium score.   B12: she has been running high with monthly injections at 1500 then 1400mg . Has not been able to tolerate B12 pills.   Vitamin D deficiency:  She states it was as low as 15 in may with her endocrinologist. Would like to know why it varies so much. Has not been taking any vitamin D. Did fine on the px medication. Did not interfere with her bladder.   Interested in calcium score/scan of her heart for cholesterol medication. She had this set up with dr. Juleen China and wasn't able to get this done.   The 10-year ASCVD risk score [name] DC Jr., et al., 2013) is: 7.4%    Review of Systems  Constitutional: Positive for fatigue. Negative for fever.  Respiratory: Positive for shortness of breath (s/p covid ).   Cardiovascular: Negative for chest pain, palpitations and leg swelling.  Gastrointestinal: Negative for abdominal pain, diarrhea, nausea and vomiting.  Skin: Negative for rash.  Neurological: Negative for dizziness and headaches.  Psychiatric/Behavioral: Negative for sleep disturbance.    Allergies Patient is allergic to sulfa antibiotics and montelukast sodium.  Past Medical History Patient  has a past medical history of Allergy, Chronic interstitial cystitis, and Thyroid disease.  Surgical History Patient  has a past surgical history that includes Abdominal hysterectomy and lumps removed from breast.  Family History Pateint's family history includes Cancer in her mother; Heart attack in her father and paternal grandfather; Stroke in her maternal grandmother.  Social History Patient  reports that she has never smoked. She has never used  smokeless tobacco. She reports that she does not drink alcohol or use drugs.    Objective: Vitals:   11/15/19 1135  BP: 108/75  Pulse: 75  Temp: 97.6 F (36.4 C)  TempSrc: Temporal  SpO2: 95%  Weight: 117 lb 12.8 oz (53.4 kg)  Height: 5\' 3"  (1.6 m)    Body mass index is 20.87 kg/m.  Physical Exam Vitals reviewed.  Constitutional:      Appearance: Normal appearance. She is normal weight.  HENT:     Head: Normocephalic and atraumatic.     Right Ear: Tympanic membrane, ear canal and external ear normal.     Left Ear: Tympanic membrane, ear canal and external ear normal.     Nose: Nose normal.     Mouth/Throat:     Mouth: Mucous membranes are moist.  Eyes:     Extraocular Movements: Extraocular movements intact.     Conjunctiva/sclera: Conjunctivae normal.     Pupils: Pupils are equal, round, and reactive to light.  Neck:     Vascular: No carotid bruit.  Cardiovascular:     Rate and Rhythm: Normal rate and regular rhythm.     Heart sounds: Normal heart sounds.  Pulmonary:     Effort: Pulmonary effort is normal.     Breath sounds: Normal breath sounds.  Abdominal:     General: Abdomen is flat. Bowel sounds are normal.     Palpations: Abdomen is soft.  Musculoskeletal:     Cervical back: Normal range of motion and neck supple.  Skin:    General: Skin is warm.  Neurological:     General: No focal deficit present.     Mental Status: She is alert and oriented to person, place, and time.  Psychiatric:        Mood and Affect: Mood normal.        Behavior: Behavior normal.        Assessment/plan: 1. Mixed hyperlipidemia High LDL with risk factor of 7.4%. CT calcium score ordered. She is aware of cost and is okay doing this.  The 10-year ASCVD risk score [name] DC Brooke Bonito., et al., 2013) is: 7.4%  - CT CARDIAC SCORING; Future  2. Atherosclerosis of aorta ([place])   - CT CARDIAC SCORING; Future  3. B12 deficiency Quite high on last 2 checks. Will have her stop her  injections and recheck in 2 months time.   4. Vitamin D deficiency Putting her back on weekly supplements at 5000IU/week. Cant tolerate otc vitamin D, but the px pill worked for her. Will recheck level at same time.     This visit occurred during the SARS-CoV-2 public health emergency.  Safety protocols were in place, including screening questions prior to the visit, additional usage of staff PPE, and extensive cleaning of exam room while observing appropriate contact time as indicated for disinfecting solutions.    Return if symptoms worsen or fail to improve.   Orma Flaming, MD [place]   11/15/2019

## 2019-11-16 NOTE — Telephone Encounter (Signed)
error 

## 2019-11-30 ENCOUNTER — Ambulatory Visit: Payer: Medicare Other

## 2019-12-09 ENCOUNTER — Ambulatory Visit: Payer: Medicare Other

## 2019-12-17 ENCOUNTER — Ambulatory Visit: Payer: Medicare Other

## 2019-12-21 ENCOUNTER — Telehealth: Payer: Self-pay

## 2019-12-21 NOTE — Telephone Encounter (Signed)
Pt calling about a order that was suppose  to be sent [facility]. Please follow up with Patient as soon as possible.

## 2019-12-22 ENCOUNTER — Other Ambulatory Visit: Payer: Self-pay | Admitting: Family Medicine

## 2019-12-22 ENCOUNTER — Other Ambulatory Visit: Payer: Self-pay

## 2019-12-22 DIAGNOSIS — Z9189 Other specified personal risk factors, not elsewhere classified: Secondary | ICD-10-CM

## 2019-12-22 DIAGNOSIS — I700 Atherosclerosis of aorta: Secondary | ICD-10-CM

## 2019-12-22 NOTE — Telephone Encounter (Signed)
Spoke with pt this morning, she states that she went to [facility] and no order was placed. She also said that she was getting her Covid vaccinations around the same time and did not want anything to effect those. This is why she has not had her scan done. She says that she also stopped taking Vit B12, and as a result her hair is coming out. She has had some tingling in her legs , followed by Dr Berdine Addison. And recently had DVT scan at [facility] location.    Please Advise.

## 2019-12-22 NOTE — Telephone Encounter (Signed)
I will send another message to [place].

## 2019-12-22 NOTE — Telephone Encounter (Signed)
She was supposed to have a cardiac calcium score done. Ordered correctly in her chart. Not sure if supposed to be at [facility]. Colletta [place], can you please follow up on this.  Orma Flaming, MD [place]

## 2019-12-23 ENCOUNTER — Other Ambulatory Visit: Payer: Self-pay | Admitting: Family Medicine

## 2019-12-23 DIAGNOSIS — E782 Mixed hyperlipidemia: Secondary | ICD-10-CM

## 2019-12-23 DIAGNOSIS — I700 Atherosclerosis of aorta: Secondary | ICD-10-CM

## 2019-12-27 DIAGNOSIS — N6002 Solitary cyst of left breast: Secondary | ICD-10-CM | POA: Insufficient documentation

## 2019-12-27 DIAGNOSIS — N6001 Solitary cyst of right breast: Secondary | ICD-10-CM | POA: Insufficient documentation

## 2019-12-27 DIAGNOSIS — N6019 Diffuse cystic mastopathy of unspecified breast: Secondary | ICD-10-CM | POA: Insufficient documentation

## 2019-12-30 ENCOUNTER — Telehealth: Payer: Self-pay | Admitting: Family Medicine

## 2019-12-30 NOTE — Telephone Encounter (Signed)
Left message for patient to call back and schedule Medicare Annual Wellness Visit (AWV) either virtually/audio only OR in office. Whatever the patients preference is.  No hx; please schedule at anytime with [facility]-Nurse Health Advisor at [facility].  Started MCR in 01/2018

## 2020-01-10 ENCOUNTER — Ambulatory Visit: Payer: Medicare Other | Admitting: Family Medicine

## 2020-01-10 ENCOUNTER — Other Ambulatory Visit: Payer: Self-pay

## 2020-01-10 DIAGNOSIS — J302 Other seasonal allergic rhinitis: Secondary | ICD-10-CM

## 2020-01-10 MED ORDER — FLUTICASONE PROPIONATE 50 MCG/ACT NA SUSP: NASAL | 2 refills | Status: DC

## 2020-01-12 ENCOUNTER — Telehealth: Payer: Self-pay

## 2020-01-12 ENCOUNTER — Other Ambulatory Visit: Payer: Medicare Other

## 2020-01-13 NOTE — Telephone Encounter (Signed)
error 

## 2020-01-18 ENCOUNTER — Telehealth: Payer: Self-pay

## 2020-01-18 NOTE — Telephone Encounter (Signed)
Patient calling regarding calcium scan of her heart.patient states that she haven't heard anything regarding this appointment. Patient states that she's been waiting to hear something back since January Please  Follow up with patient.

## 2020-01-19 NOTE — Telephone Encounter (Signed)
We re ordered this for her when she called last time. Colletta [place], do you know what is going on?  Thanks,  Dr Rogers Blocker

## 2020-01-25 ENCOUNTER — Encounter: Payer: Self-pay | Admitting: Family Medicine

## 2020-01-28 ENCOUNTER — Telehealth: Payer: Self-pay | Admitting: Family Medicine

## 2020-01-28 DIAGNOSIS — E782 Mixed hyperlipidemia: Secondary | ICD-10-CM

## 2020-01-28 NOTE — Telephone Encounter (Signed)
Spoke to patient to give her the message below. Pt seemed to be more interested in following up with Dr Debara Pickett. I communicated the number and address to patient. Pt voiced understanding after explaining that Dr Debara Pickett was a cardiologist and would be able to discuss further questions or concerns.

## 2020-01-28 NOTE — Addendum Note (Signed)
Addended by: Orma Flaming on: 01/28/2020 03:14 PM   Modules accepted: Orders

## 2020-01-28 NOTE — Telephone Encounter (Signed)
1) please tell her happy birthday!  2) we got her cardiac calcium score back and it is 45. This is between the 50th and 62 percentile for her age. This means she has at least mild atherosclerotic plaque with mild or minimal coronary artery narrowings likely.   With this and her elevated ASCVD risk of over 8%, I do recommend statin therapy.  If she does not want to do this, I would suggest we send her to the lipid clinic to discuss with Dr. Debara Pickett.   Happy easter,  Dr. Rogers Blocker

## 2020-02-04 ENCOUNTER — Encounter: Payer: Self-pay | Admitting: Internal Medicine

## 2020-02-04 ENCOUNTER — Other Ambulatory Visit: Payer: Self-pay

## 2020-02-04 ENCOUNTER — Ambulatory Visit (INDEPENDENT_AMBULATORY_CARE_PROVIDER_SITE_OTHER): Payer: Medicare Other | Admitting: Internal Medicine

## 2020-02-04 VITALS — BP 120/69 | HR 64 | Temp 97.2°F | Ht 63.0 in | Wt 116.8 lb

## 2020-02-04 DIAGNOSIS — M791 Myalgia, unspecified site: Secondary | ICD-10-CM

## 2020-02-04 DIAGNOSIS — Z8249 Family history of ischemic heart disease and other diseases of the circulatory system: Secondary | ICD-10-CM | POA: Diagnosis not present

## 2020-02-04 DIAGNOSIS — R931 Abnormal findings on diagnostic imaging of heart and coronary circulation: Secondary | ICD-10-CM

## 2020-02-04 DIAGNOSIS — T466X5A Adverse effect of antihyperlipidemic and antiarteriosclerotic drugs, initial encounter: Secondary | ICD-10-CM

## 2020-02-04 DIAGNOSIS — E7849 Other hyperlipidemia: Secondary | ICD-10-CM

## 2020-02-04 NOTE — Progress Notes (Signed)
OFFICE NOTE  Chief Complaint:  Manage dyslipidemia  Primary Care Physician: Jocelyn Flaming, MD  HPI:  Jocelyn Parker is a 69 y.o. female with a past medial history significant for longstanding dyslipidemia.  Is also a family history of heart disease and dyslipidemia particular on her father side.  Multiple family members with early onset heart disease.  She reports an unfortunate intolerance to statins over a number of years.  She has tried most of the high potency statins such as Lipitor and Crestor as well as simvastatin.  More recently she underwent CT calcium scoring through [facility] which was a score 45.  This places her in the 50-60th percentile based on age and sex matched controls.  Today she is here to discuss those findings and consider treatment options.  I explained to her the importance of abnormal coronary calcium and the relevance given her family history.  I am concerned with LDL now of close to 190 or higher that is likely that she has familial hyperlipidemia.  This would fit with her family history of heart disease.  She does have a number of siblings who have high cholesterol but she is not aware if they have had prior coronary disease.  She does not have any children.  Currently she is asymptomatic and is physically active.  She says she eats a very healthy diet and reduces gluten and other sources of dietary cholesterol.  PMHx:  Past Medical History:  Diagnosis Date  . Allergy    sulfa  . Chronic interstitial cystitis   . Thyroid disease     Past Surgical History:  Procedure Laterality Date  . ABDOMINAL HYSTERECTOMY    . lumps removed from breast     1970's ,1990's and in 2000's  nono cancer     FAMHx:  Family History  Problem Relation Age of Onset  . Cancer Mother        renal   . Heart attack Father   . Stroke Maternal Grandmother   . Heart attack Paternal Grandfather     SOCHx:   reports that she has never smoked. She has never used smokeless tobacco.  She reports that she does not drink alcohol or use drugs.  ALLERGIES:  Allergies  Allergen Reactions  . Sulfa Antibiotics Hives  . Montelukast Sodium     dizziness    ROS: Pertinent items noted in HPI and remainder of comprehensive ROS otherwise negative.  HOME MEDS: Current Outpatient Medications on File Prior to Visit  Medication Sig Dispense Refill  . cyanocobalamin 1000 MCG tablet Take 1,000 mcg by mouth every 30 (thirty) days.    . ergocalciferol (VITAMIN D2) 1.25 MG (50000 UT) capsule Take 1 capsule (50,000 Units total) by mouth once a week. 12 capsule 4  . estradiol (VIVELLE-DOT) 0.1 MG/24HR Place 1 patch (0.1 mg total) onto the skin 2 (two) times a week. 8 patch 12  . fluticasone (FLONASE) 50 MCG/ACT nasal spray SHAKE LIQUID AND USE 2 SPRAYS IN EACH NOSTRIL DAILY 16 g 2  . ibuprofen (ADVIL) 800 MG tablet ibuprofen 800 mg tablet    . levothyroxine (SYNTHROID) 50 MCG tablet Take 50 mcg by mouth daily before breakfast.    . levothyroxine (SYNTHROID, LEVOTHROID) 75 MCG tablet Take 75 mcg by mouth daily.      . magnesium 30 MG tablet Take by mouth.    . meclizine (ANTIVERT) 12.5 MG tablet meclizine 12.5 mg tablet    . Syringe/Needle, Disp, (SYRINGE 3CC/25GX1") 25G X 1" 3  ML MISC 1 application by Does not apply route once a week. 12 each 0  . UNABLE TO FIND Med Name: CBD Cream     No current facility-administered medications on file prior to visit.    LABS/IMAGING: No results found for this or any previous visit (from the past 48 hour(s)). No results found.  LIPID PANEL:    Component Value Date/Time   CHOL 255 (H) 05/12/2019 1021   TRIG 99.0 05/12/2019 1021   HDL 51.00 05/12/2019 1021   CHOLHDL 5 05/12/2019 1021   VLDL 19.8 05/12/2019 1021   LDLCALC 185 (H) 05/12/2019 1021     WEIGHTS: Wt Readings from Last 3 Encounters:  02/04/20 116 lb 12.8 oz (53 kg)  11/15/19 117 lb 12.8 oz (53.4 kg)  09/09/19 116 lb (52.6 kg)    VITALS: BP 120/69   Pulse 64   Temp (!)  97.2 F (36.2 C)   Ht 5\' 3"  (1.6 m)   Wt 116 lb 12.8 oz (53 kg)   SpO2 99%   BMI 20.69 kg/m   EXAM: Deferred  EKG: Deferred  ASSESSMENT: 1. Possible familial hyperlipidemia-Jocelyn Parker score of 4 2. Abnormal CAC score 45 (01/2020) 3. Family history of coronary disease on her father side 4. Statin intolerance-myalgias  PLAN: 1.   [name] has possible familial hyperlipidemia and LDL has been consistently around 190 untreated.  She does have coronary calcification with a score 45 which is low risk however abnormal given her age.  There is a family history of heart disease on her father side more consistent with a genetic dyslipidemia and the fact that she has a relatively healthy diet.  We discussed several options for therapy however I think she is a good candidate for PCSK9 inhibitor based on clinical diagnosis of FH and the need for 50% reduction in LDL cholesterol since her LDL is over 190.  We also discussed possible role for genetic testing.  She is interested in better understanding of her lipids and would like to pursue genetic testing.  I will refer her to Dr. Lattie Parker for additional testing.  She will consider whether PCSK9 inhibitor therapy is worthwhile pursuing based on that conversation.  She was informed that we may not be able to identify an abnormal gene which does not exclude the fact that she has FH.  She would still be a candidate for therapy.  Thanks for the kind referral.  Plan follow-up with me in about 3 to 4 months.  Jocelyn Casino, MD, [facility], [place] Director of the Advanced Lipid Disorders &  Cardiovascular Risk Reduction Clinic Diplomate of the American Board of Clinical Lipidology Attending Cardiologist  Direct Dial: [phone]  Fax: [phone]  Website:  [email or web address]  02/04/2020, 11:37 AM

## 2020-02-04 NOTE — Patient Instructions (Signed)
Medication Instructions:  Your physician recommends that you continue on your current medications as directed. Please refer to the Current Medication list given to you today.  Repatha or Praluent - PCSK9i  *If you need a refill on your cardiac medications before your next appointment, please call your pharmacy*   Follow-Up: At [facility], you and your health needs are our priority.  As part of our continuing mission to provide you with exceptional heart care, we have created designated Provider Care Teams.  These Care Teams include your primary Cardiologist (physician) and Advanced Practice Providers (APPs -  Physician Assistants and Nurse Practitioners) who all work together to provide you with the care you need, when you need it.  We recommend signing up for the patient portal called "MyChart".  Sign up information is provided on this After Visit Summary.  MyChart is used to connect with patients for Virtual Visits (Telemedicine).  Patients are able to view lab/test results, encounter notes, upcoming appointments, etc.  Non-urgent messages can be sent to your provider as well.   To learn more about what you can do with MyChart, go to [email or web address].    Your next appointment:   3 month(s)  The format for your next appointment:   Either In Person or Virtual  Provider:   Raliegh Ip Mali Hilty, MD   Other Instructions  You have been referred to Dr. Lattie Corns - geneticist

## 2020-03-01 ENCOUNTER — Other Ambulatory Visit: Payer: Self-pay | Admitting: Family Medicine

## 2020-03-01 ENCOUNTER — Telehealth: Payer: Self-pay | Admitting: Family Medicine

## 2020-03-01 DIAGNOSIS — E538 Deficiency of other specified B group vitamins: Secondary | ICD-10-CM

## 2020-03-01 DIAGNOSIS — E559 Vitamin D deficiency, unspecified: Secondary | ICD-10-CM

## 2020-03-01 NOTE — Telephone Encounter (Signed)
Patient called in and wanted to see if Dr. Rogers Blocker would put in Lab work so she can get her B12 and Vit D checked. Please advise.

## 2020-03-01 NOTE — Telephone Encounter (Signed)
Called pt to give message below. Pt says that she should be covered completely she has 3 different insurances. Please schedule lab app with pt.  Thank You

## 2020-03-01 NOTE — Telephone Encounter (Signed)
Yes I can put in order for this. We checked in December and her b12 was very high and her D was normal. Unsure if medicare will pay for the D since it was normal less than 6 months ago.  Dr. Rogers Blocker

## 2020-03-06 ENCOUNTER — Encounter: Payer: Self-pay | Admitting: Family Medicine

## 2020-03-06 ENCOUNTER — Other Ambulatory Visit: Payer: Self-pay

## 2020-03-06 ENCOUNTER — Ambulatory Visit (INDEPENDENT_AMBULATORY_CARE_PROVIDER_SITE_OTHER): Payer: Medicare Other | Admitting: Family Medicine

## 2020-03-06 VITALS — BP 102/60 | HR 60 | Temp 98.1°F | Ht 63.0 in | Wt 114.0 lb

## 2020-03-06 DIAGNOSIS — E538 Deficiency of other specified B group vitamins: Secondary | ICD-10-CM | POA: Diagnosis not present

## 2020-03-06 DIAGNOSIS — R070 Pain in throat: Secondary | ICD-10-CM | POA: Diagnosis not present

## 2020-03-06 DIAGNOSIS — E559 Vitamin D deficiency, unspecified: Secondary | ICD-10-CM | POA: Diagnosis not present

## 2020-03-06 DIAGNOSIS — E038 Other specified hypothyroidism: Secondary | ICD-10-CM | POA: Diagnosis not present

## 2020-03-06 LAB — T4, FREE: Free T4: 1.28 ng/dL (ref 0.60–1.60)

## 2020-03-06 LAB — VITAMIN B12: Vitamin B-12: >1500 pg/mL — ABNORMAL HIGH (ref 211–911)

## 2020-03-06 LAB — TSH: TSH: 0.17 u[IU]/mL — ABNORMAL LOW (ref 0.35–4.50)

## 2020-03-06 LAB — VITAMIN D 25 HYDROXY (VIT D DEFICIENCY, FRACTURES): VITD: 47.8 ng/mL (ref 30.00–100.00)

## 2020-03-06 NOTE — Patient Instructions (Signed)
Looking great. i'll let you know when labs come back!  Aw

## 2020-03-06 NOTE — Progress Notes (Signed)
Patient: Jocelyn Parker MRN: [id number] DOB: [birth year] PCP: Orma Flaming, MD     Subjective:  Chief Complaint  Patient presents with  . Hyperlipidemia  . Hypothyroidism    Pt would like to discuss B12 injections.  . Throat irritation    Pt says that this started a little over a week ago. She had a abreathing test at [facility].    HPI: The patient is a 69 y.o. female who presents for lab check and complaints of throat irritation.    Lung nodule -she has a right upper lobe nodule in her chest. Saw her pulmonologist who ordered a PET scan and shows a hypermetabolic spiculated lesion. She is now going to see a Psychologist, sport and exercise this week for further plan. She is asymptomatic. Concern for cancer vs. Benign lesion.   B12 deficiency -would like her labs checked today. We last checked this 4 months ago and it was elevated >1200. She stopped taking her b12. She started taking her b12 again in march and is on 1080mcg every other week.   Vitamin D She would like this rechecked. Last checked 4 months ago and was wnl. She is on daily replacement 5000IU/day.   Hypothyroidism -followed by endocrinology. Has not had labs in a while and feeling tired so we will check today.   Sore throat Symptoms started about 1-2 weeks ago. She feels like it's stretchy. It stays sore in the area. Doesn't feel like post nasal drip at all. No hot drinks or food. Feels like it's on the outside, not on the inside. Nothing really makes it worse and laying down makes it worse. It is tender to touch, but has improved since last week. She has no problems swallowing. She states she doesn't really have vocal changes either.   Review of Systems  HENT: Negative for congestion and sore throat.   Respiratory: Negative for cough, shortness of breath and wheezing.   Cardiovascular: Negative for chest pain and palpitations.  Neurological: Negative for dizziness, light-headedness and headaches.    Allergies Patient is allergic to  sulfa antibiotics and montelukast sodium.  Past Medical History Patient  has a past medical history of Allergy, Chronic interstitial cystitis, and Thyroid disease.  Surgical History Patient  has a past surgical history that includes Abdominal hysterectomy and lumps removed from breast.  Family History Pateint's family history includes Cancer in her mother; Heart attack in her father and paternal grandfather; Stroke in her maternal grandmother.  Social History Patient  reports that she has never smoked. She has never used smokeless tobacco. She reports that she does not drink alcohol or use drugs.    Objective: Vitals:   03/06/20 1049  BP: 102/60  Pulse: 60  Temp: 98.1 F (36.7 C)  TempSrc: Temporal  SpO2: 99%  Weight: 114 lb (51.7 kg)  Height: 5\' 3"  (1.6 m)    Body mass index is 20.19 kg/m.  Physical Exam Vitals reviewed.  Constitutional:      Appearance: Normal appearance. She is normal weight.  HENT:     Head: Normocephalic and atraumatic.     Right Ear: Tympanic membrane, ear canal and external ear normal.     Left Ear: Tympanic membrane, ear canal and external ear normal.     Nose: Nose normal.     Mouth/Throat:     Mouth: Mucous membranes are moist.     Comments: Cobblestoning on posterior pharynx  Neck:     Comments: TTP over right upper pole of thyroid/cricoid. Slightly enlarged compared to  left.  Cardiovascular:     Rate and Rhythm: Normal rate and regular rhythm.     Heart sounds: Normal heart sounds.  Pulmonary:     Effort: Pulmonary effort is normal.     Breath sounds: Normal breath sounds.  Abdominal:     General: Abdomen is flat. Bowel sounds are normal.     Palpations: Abdomen is soft.  Musculoskeletal:     Cervical back: Normal range of motion and neck supple. No rigidity.  Lymphadenopathy:     Cervical: No cervical adenopathy.  Skin:    Capillary Refill: Capillary refill takes less than 2 seconds.  Neurological:     General: No focal deficit  present.     Mental Status: She is alert and oriented to person, place, and time.        Assessment/plan: 1. Pain in throat TTP on superior aspect of right thyroid lobe. It has gotten better over the last week. No red flags on exam. Will check thyroid labs and if not getting better she will email me and we will check thyroid ultrasound. PET scan was negative in her neck area with concern for lung cancer.   2. Other specified hypothyroidism Will check today with ttp over thyroid. Followed by endo.  - T4, free - TSH  3. Vitamin D deficiency  - VITAMIN D 25 Hydroxy (Vit-D Deficiency, Fractures)  4. B12 deficiency Needs refill of her syringes, but will check labs today and see where she is at.  - Vitamin B12   This visit occurred during the SARS-CoV-2 public health emergency.  Safety protocols were in place, including screening questions prior to the visit, additional usage of staff PPE, and extensive cleaning of exam room while observing appropriate contact time as indicated for disinfecting solutions.      Return if symptoms worsen or fail to improve.   Orma Flaming, MD [place]     03/06/2020

## 2020-03-08 ENCOUNTER — Other Ambulatory Visit: Payer: Self-pay | Admitting: Family Medicine

## 2020-03-08 DIAGNOSIS — E538 Deficiency of other specified B group vitamins: Secondary | ICD-10-CM

## 2020-03-08 MED ORDER — "SYRINGE 25G X 1"" 3 ML MISC": 0 refills | Status: DC

## 2020-03-08 MED ORDER — CYANOCOBALAMIN 1000 MCG/ML IJ SOLN
1000.0000 ug | INTRAMUSCULAR | 0 refills | Status: DC
Start: 2020-03-08 — End: 2021-03-09

## 2020-03-16 ENCOUNTER — Other Ambulatory Visit: Payer: Self-pay

## 2020-03-16 ENCOUNTER — Ambulatory Visit: Payer: Medicare Other | Admitting: Genetic Counselor

## 2020-03-23 ENCOUNTER — Ambulatory Visit: Payer: Medicare Other | Admitting: Internal Medicine

## 2020-03-26 NOTE — Progress Notes (Signed)
Pre-test Genetic Consult notes   Referring Provider: Kenneth Mali Hilty, MD   Referral Reason Jocelyn Parker was referred for genetic consult and testing of familial hypercholesterolemia (FH).  Jocelyn Parker (III.5 on pedigree) is a pleasant 69 year old Caucasian lady was found to have hyperlipidemia (LDL-C 165) in her 42s during a routine wellness visit. She reports being on statins that lowered her LDL-C to 150 and then upon integrating an exercise regimen her LDL-C was at 130. She discontinued statins due to myalgias. Currently her LDL-C is at 185 without statins or exercise routine.  Risk Factors Jocelyn Parker denies having hypothyroidism, renal or hepatic dysfunction or diabetes that can also lead to Jocelyn Parker. She does not smoke any more and leads healthy lifestyle of good diet and exercise.   Family history Jocelyn Parker (III.5) does not have children. She has an older sister (III.2), deceased from sepsis; an older brother (III.3) who succumbed to cancer at 58 and, another older brother, age 71 (III.4) with prostate cancer. Her younger sister (III.6) also has elevated LDL-C of 130. No adverse cardiac events have been reported amongst her living siblings and  their kids (IV.1-IV.9). No history of heart attack or sudden death in her maternal lineage.  Jocelyn Parker's father (II.11) was told that he needed open-heart surgery but did not pursue this as he was busy with his farm. He died of a heart attack at age 7. His siblings- including 2 sisters (II.9, II.10) and 5 brothers (II.1-II.5) died of a heart attack, ages ranging from 73 to 65s. She also reports sudden death that was suspected to be a heart attack in her paternal grandfather (I.1) in his 71s.   Genetic Consultation Notes  We walked through the risk factors that can lead to hypercholesterolemia. I explained to Jocelyn Parker that the characteristic features of a genetic condition include absence of risk factors that can lead to elevated LDL-C,  early age of presentation, increased disease severity and family history of the condition. The clinical manifestations of FH were also reviewed. She denies having xanthomas, corneal arcus or a heart attack.  I discussed the molecular pathogenesis of FH. I informed her that Jocelyn Parker is primarily caused by pathogenic variants in three genes, namely APOB, LDLR and PCSK9. These pathogenic variants impact LDLR synthesis, degradation and recycling in cells and results in elevated LDL-C levels. We then walked through autosomal dominant inheritance pattern and viewed pedigree of families with heterozygous FH (HeFH) and homozygous FH (HoFH). Explained to her that digenic or compound heterozygous mutations in APOB, LDLR and PCSK9 genes can cause HoFH.   We reviewed the likely outcomes of FH genetic testing. Based on the diagnostic criteria for FH, yields can range from 50%-90%. A positive yield is observed in  ~63% of patients with a definite clinical diagnosis of FH. A negative test does not exclude a genetic basis for FH. In some cases, polygenic inheritance, where more than an average number of common variants, each with small effect, are known to increase plasma lipid levels. Additionally, limitations in current genetic testing methodology can produce a negative result. Variants of unknown significance (VUS) can be seen in some cases. I explained that typically a VUS is so classified if the variant is not well understood as very few individuals have been reported to harbor this variant or its role in gene function has not been elucidated. Screening other first-degree family members by genetic testing was also discussed. Additionally, we briefly touched upon the molecular basis of the different treatment modalities  that are currently available.  Impression  In summary, Jocelyn Parker was diagnosed with hypercholesterolemia in her 19s in the absence of typical risk factors for this condition. There is also a family history of  death from heart attack in several paternal relatives. In light of her age of presentation and significant family history of hypercholesterolemia in her first-degree relatives, genetic testing for Familial Hypercholesterolemia (FH) is recommended. Genetic testing should evaluate the major genes implicated in familial hypercholesterolemia.   Please note that the patient has not been counseled in this visit on personal, cultural or ethical issues that she may face due to her heart condition.   Plans Jocelyn Parker is reluctant to proceed with FH genetic testing as she is disinclined to a lifetime of Repatha injections. Additionally, her insurance provider does not cover genetic testing of FH. She would like to give this some thought and rethink her insurance coverage. She states that she may proceed with FH genetic testing at a later date     Jocelyn Parker, Ph.D, [place]

## 2020-04-05 DIAGNOSIS — R911 Solitary pulmonary nodule: Secondary | ICD-10-CM | POA: Insufficient documentation

## 2020-04-05 HISTORY — PX: LUNG SURGERY: SHX703

## 2020-04-12 DIAGNOSIS — Z902 Acquired absence of lung [part of]: Secondary | ICD-10-CM | POA: Insufficient documentation

## 2020-05-11 ENCOUNTER — Encounter: Payer: Self-pay | Admitting: Family Medicine

## 2020-05-17 ENCOUNTER — Telehealth: Payer: Self-pay

## 2020-05-17 NOTE — Telephone Encounter (Signed)
Pt is experiencing leg pain. She had surgery in June. There are no available appointments in the near future. What is the recommendation?

## 2020-05-17 NOTE — Telephone Encounter (Signed)
Please schedule pt for Same Day slot Friday @ 11:20.  Thanks

## 2020-05-19 ENCOUNTER — Other Ambulatory Visit: Payer: Self-pay

## 2020-05-19 ENCOUNTER — Ambulatory Visit (INDEPENDENT_AMBULATORY_CARE_PROVIDER_SITE_OTHER): Payer: Medicare Other | Admitting: Family Medicine

## 2020-05-19 ENCOUNTER — Encounter: Payer: Self-pay | Admitting: Family Medicine

## 2020-05-19 VITALS — BP 108/58 | HR 73 | Temp 97.2°F | Ht 63.0 in | Wt 113.8 lb

## 2020-05-19 DIAGNOSIS — E538 Deficiency of other specified B group vitamins: Secondary | ICD-10-CM | POA: Diagnosis not present

## 2020-05-19 DIAGNOSIS — R252 Cramp and spasm: Secondary | ICD-10-CM

## 2020-05-19 LAB — COMPREHENSIVE METABOLIC PANEL
ALT: 17 U/L (ref 0–35)
AST: 16 U/L (ref 0–37)
Albumin: 4.2 g/dL (ref 3.5–5.2)
Alkaline Phosphatase: 72 U/L (ref 39–117)
BUN: 12 mg/dL (ref 6–23)
CO2: 32 mEq/L (ref 19–32)
Calcium: 9.2 mg/dL (ref 8.4–10.5)
Chloride: 102 mEq/L (ref 96–112)
Creatinine, Ser: 0.74 mg/dL (ref 0.40–1.20)
GFR: 94.63 mL/min (ref >60.00)
Glucose, Bld: 87 mg/dL (ref 70–99)
Potassium: 4.2 mEq/L (ref 3.5–5.1)
Sodium: 139 mEq/L (ref 135–145)
Total Bilirubin: 0.6 mg/dL (ref 0.2–1.2)
Total Protein: 6.8 g/dL (ref 6.0–8.3)

## 2020-05-19 LAB — CBC WITH DIFFERENTIAL/PLATELET
Basophils Absolute: 0 10*3/uL (ref 0.0–0.1)
Basophils Relative: 0.7 % (ref 0.0–3.0)
Eosinophils Absolute: 0 10*3/uL (ref 0.0–0.7)
Eosinophils Relative: 1.1 % (ref 0.0–5.0)
HCT: 37.6 % (ref 36.0–46.0)
Hemoglobin: 12.8 g/dL (ref 12.0–15.0)
Lymphocytes Relative: 22.3 % (ref 12.0–46.0)
Lymphs Abs: 1 10*3/uL (ref 0.7–4.0)
MCHC: 34 g/dL (ref 30.0–36.0)
MCV: 94 fl (ref 78.0–100.0)
Monocytes Absolute: 0.4 10*3/uL (ref 0.1–1.0)
Monocytes Relative: 9.1 % (ref 3.0–12.0)
Neutro Abs: 2.9 10*3/uL (ref 1.4–7.7)
Neutrophils Relative %: 66.8 % (ref 43.0–77.0)
Platelets: 245 10*3/uL (ref 150.0–400.0)
RBC: 4 Mil/uL (ref 3.87–5.11)
RDW: 13.9 % (ref 11.5–15.5)
WBC: 4.3 10*3/uL (ref 4.0–10.5)

## 2020-05-19 LAB — CK: Total CK: 69 U/L (ref 7–177)

## 2020-05-19 LAB — TSH: TSH: 3.77 u[IU]/mL (ref 0.35–4.50)

## 2020-05-19 LAB — VITAMIN B12: Vitamin B-12: >1526 pg/mL — ABNORMAL HIGH (ref 211–911)

## 2020-05-19 MED ORDER — BACLOFEN 10 MG PO TABS
10.0000 mg | ORAL_TABLET | Freq: Three times a day (TID) | ORAL | 0 refills | Status: DC | PRN
Start: 2020-05-19 — End: 2020-10-19

## 2020-05-19 NOTE — Patient Instructions (Signed)
1) checking lots of labs 2) in meantime let's start muscle relaxer and see if this gives you relief while I get labs back and then we can go from there.   im so glad your surgery went well!  Dr. Rogers Blocker

## 2020-05-19 NOTE — Progress Notes (Signed)
Patient: Jocelyn Parker MRN: [id number] DOB: Sep 08, 1951 PCP: Orland Mustard, MD     Subjective:  Chief Complaint  Patient presents with  . Leg Pain    HPI: The patient is a 69 y.o. female who presents today for leg pain.  She has pain in bilateral legs that started around 04/05/20 after her lung surgery. Pain is located in her upper and lower legs and at times she feels it in her arms. Pain described as tight. Can happen at night or during day. Sitting/standing does not make it worse. She doesn't drink a lot of water. She did give herself a b12 injection on Wednesday and it seemed to help. She was on gabapentin and tylenol and is completely off medication. Didn't think these helped with the pain. She does not have cramping with walking. She has no weakness in her legs, no problems getting out of chair. Sensation is intact. No swelling or redness in her calves. She denies any rashes, shortness of breath, chest pain.  No joint pain or swelling.   Has burning in her toes at times as well.   Review of Systems  Constitutional: Negative for chills, fatigue, fever and unexpected weight change.  Respiratory: Negative for cough, shortness of breath and wheezing.   Cardiovascular: Negative for chest pain, palpitations and leg swelling.  Gastrointestinal: Negative for abdominal pain, diarrhea, nausea and vomiting.  Musculoskeletal: Positive for myalgias. Negative for arthralgias and gait problem.  Skin: Negative for rash.    Allergies Patient is allergic to sulfa antibiotics and montelukast sodium.  Past Medical History Patient  has a past medical history of Allergy, Chronic interstitial cystitis, and Thyroid disease.  Surgical History Patient  has a past surgical history that includes Abdominal hysterectomy and lumps removed from breast.  Family History Pateint's family history includes Cancer in her mother; Heart attack in her father and paternal grandfather; Stroke in her maternal  grandmother.  Social History Patient  reports that she has never smoked. She has never used smokeless tobacco. She reports that she does not drink alcohol and does not use drugs.    Objective: Vitals:   05/19/20 1135  BP: (!) 108/58  Pulse: 73  Temp: (!) 97.2 F (36.2 C)  TempSrc: Temporal  SpO2: 99%  Weight: 113 lb 12.8 oz (51.6 kg)  Height: 5\' 3"  (1.6 m)    Body mass index is 20.16 kg/m.  Physical Exam Vitals reviewed.  Constitutional:      Appearance: Normal appearance. She is normal weight.  HENT:     Head: Normocephalic and atraumatic.     Right Ear: Tympanic membrane, ear canal and external ear normal.     Left Ear: Tympanic membrane, ear canal and external ear normal.  Cardiovascular:     Rate and Rhythm: Normal rate and regular rhythm.     Heart sounds: Normal heart sounds.  Pulmonary:     Effort: Pulmonary effort is normal.     Breath sounds: Normal breath sounds.  Abdominal:     General: Abdomen is flat. Bowel sounds are normal.     Palpations: Abdomen is soft.  Musculoskeletal:        General: Tenderness present.     Cervical back: Normal range of motion and neck supple. No rigidity.     Comments: TTP over bilateral calf muscles and distal quadriceps. No swelling of lower legs or erythema. Negative homan signs. No rashes. Strength intact of lower legs.   Lymphadenopathy:     Cervical: No cervical adenopathy.  Skin:    Capillary Refill: Capillary refill takes less than 2 seconds.     Findings: No rash.  Neurological:     General: No focal deficit present.     Mental Status: She is alert and oriented to person, place, and time.     Cranial Nerves: No cranial nerve deficit.     Motor: No weakness.     Gait: Gait normal.     Deep Tendon Reflexes: Reflexes normal.  Psychiatric:        Mood and Affect: Mood normal.        Behavior: Behavior normal.        Assessment/plan: 1. Muscle cramping Large differential, encouraging no weakness.  Starting with  labs, trial of muscle relaxer prn. May need EMG and has neurologist she can set this up with, but we will get labs first.  - CBC with Differential/Platelet - Comprehensive metabolic panel - CK - TSH - ANA, IFA Comprehensive Panel - ESR  2. B12 deficiency  - Vitamin B12    This visit occurred during the SARS-CoV-2 public health emergency.  Safety protocols were in place, including screening questions prior to the visit, additional usage of staff PPE, and extensive cleaning of exam room while observing appropriate contact time as indicated for disinfecting solutions.     Return if symptoms worsen or fail to improve.   Orma Flaming, MD [place]   05/19/2020

## 2020-05-23 LAB — ANA, IFA COMPREHENSIVE PANEL
Anti Nuclear Antibody (ANA): POSITIVE — AB
ENA SM Ab Ser-aCnc: <1 AI
SM/RNP: <1 AI
SSA (Ro) (ENA) Antibody, IgG: <1 AI
SSB (La) (ENA) Antibody, IgG: <1 AI
Scleroderma (Scl-70) (ENA) Antibody, IgG: <1 AI
ds DNA Ab: 1 IU/mL

## 2020-05-23 LAB — ANTI-NUCLEAR AB-TITER (ANA TITER): ANA Titer 1: 1:320 {titer} — ABNORMAL HIGH

## 2020-05-24 ENCOUNTER — Other Ambulatory Visit: Payer: Self-pay | Admitting: Family Medicine

## 2020-05-24 DIAGNOSIS — R768 Other specified abnormal immunological findings in serum: Secondary | ICD-10-CM

## 2020-05-24 DIAGNOSIS — R252 Cramp and spasm: Secondary | ICD-10-CM

## 2020-05-25 ENCOUNTER — Encounter: Payer: Self-pay | Admitting: Family Medicine

## 2020-05-25 ENCOUNTER — Ambulatory Visit (INDEPENDENT_AMBULATORY_CARE_PROVIDER_SITE_OTHER): Payer: Medicare Other | Admitting: Family Medicine

## 2020-05-25 ENCOUNTER — Other Ambulatory Visit: Payer: Self-pay

## 2020-05-25 VITALS — BP 122/60 | HR 76 | Temp 98.3°F | Resp 18 | Ht 63.0 in | Wt 114.0 lb

## 2020-05-25 DIAGNOSIS — I8393 Asymptomatic varicose veins of bilateral lower extremities: Secondary | ICD-10-CM | POA: Diagnosis not present

## 2020-05-25 NOTE — Progress Notes (Signed)
Patient: Jocelyn Parker MRN: [id number] DOB: June 07, 1951 PCP: Orma Flaming, MD     Subjective:  Chief Complaint  Patient presents with  . Bulging Veins    Patient mentioned that since her surgery she feels that her veins have started to bulge out a little more.     HPI: The patient is a 69 y.o. female who presents today for bulging veins in her lower legs. She doesn't think it's worse at the end of the day after on her feet. She will see the superficial veins that are protruding out on her lower legs. No tortuous or tender veins. Has some spider veins as well.   Review of Systems  Constitutional: Negative for chills, fatigue and fever.  HENT: Negative for dental problem, ear pain, hearing loss and trouble swallowing.   Eyes: Negative for visual disturbance.  Respiratory: Negative for cough, chest tightness and shortness of breath.   Cardiovascular: Negative for chest pain, palpitations and leg swelling.  Gastrointestinal: Negative for abdominal pain, blood in stool, diarrhea and nausea.  Endocrine: Negative for cold intolerance, polydipsia, polyphagia and polyuria.  Genitourinary: Negative for dysuria and hematuria.  Musculoskeletal: Positive for myalgias. Negative for arthralgias.  Skin: Negative for rash.  Neurological: Negative for dizziness and headaches.  Psychiatric/Behavioral: Negative for dysphoric mood and sleep disturbance. The patient is not nervous/anxious.     Allergies Patient is allergic to sulfa antibiotics and montelukast sodium.  Past Medical History Patient  has a past medical history of Allergy, Chronic interstitial cystitis, and Thyroid disease.  Surgical History Patient  has a past surgical history that includes Abdominal hysterectomy and lumps removed from breast.  Family History Pateint's family history includes Cancer in her mother; Heart attack in her father and paternal grandfather; Stroke in her maternal grandmother.  Social History Patient   reports that she has never smoked. She has never used smokeless tobacco. She reports that she does not drink alcohol and does not use drugs.    Objective: Vitals:   05/25/20 1131  BP: (!) 122/60  Pulse: 76  Resp: 18  Temp: 98.3 F (36.8 C)  TempSrc: Temporal  SpO2: 97%  Weight: 114 lb (51.7 kg)  Height: 5\' 3"  (1.6 m)    Body mass index is 20.19 kg/m.  Physical Exam Vitals reviewed.  Constitutional:      Appearance: Normal appearance. She is normal weight.  HENT:     Head: Normocephalic and atraumatic.  Skin:    General: Skin is warm.     Findings: No bruising, erythema, lesion or rash.     Comments: Normal physiological veins that stick out on her legs and feet. No varicosities or torturous veins. Spider veins over legs.   Neurological:     General: No focal deficit present.     Mental Status: She is alert and oriented to person, place, and time.        Assessment/plan: 1. Spider veins of both lower extremities Reassured her that veins she is seeing are normal. Will send to vascular for spider veins. Could try compression hose as well.  - Ambulatory referral to Vascular Surgery    This visit occurred during the SARS-CoV-2 public health emergency.  Safety protocols were in place, including screening questions prior to the visit, additional usage of staff PPE, and extensive cleaning of exam room while observing appropriate contact time as indicated for disinfecting solutions.     Return if symptoms worsen or fail to improve.   Orma Flaming, MD [facility]   05/25/2020

## 2020-05-25 NOTE — Patient Instructions (Signed)
+  ANA-check with Dr. [name]. I usually send to rheumaotlogy for further work up.   -referral to vein specialists. Your veins look normal, but it wouldn't hurt for them to look at them. Wear compression hose.   -I wonder if thyroid is causing muscle cramping???

## 2020-06-29 ENCOUNTER — Other Ambulatory Visit: Payer: Self-pay | Admitting: Internal Medicine

## 2020-06-29 ENCOUNTER — Encounter: Payer: Self-pay | Admitting: Physician Assistant

## 2020-06-29 ENCOUNTER — Ambulatory Visit (INDEPENDENT_AMBULATORY_CARE_PROVIDER_SITE_OTHER): Payer: Medicare Other | Admitting: Physician Assistant

## 2020-06-29 ENCOUNTER — Ambulatory Visit (HOSPITAL_COMMUNITY)
Admission: RE | Admit: 2020-06-29 | Discharge: 2020-06-29 | Disposition: A | Discharge status: Home / Self Care | Payer: Medicare Other | Source: Ambulatory Visit | Attending: Vascular Surgery | Admitting: Vascular Surgery

## 2020-06-29 ENCOUNTER — Other Ambulatory Visit: Payer: Self-pay

## 2020-06-29 VITALS — BP 101/68 | HR 68 | Temp 98.7°F | Resp 20 | Ht 63.0 in | Wt 113.2 lb

## 2020-06-29 DIAGNOSIS — I6389 Other cerebral infarction: Secondary | ICD-10-CM

## 2020-06-29 DIAGNOSIS — I781 Nevus, non-neoplastic: Secondary | ICD-10-CM | POA: Insufficient documentation

## 2020-06-29 DIAGNOSIS — I8393 Asymptomatic varicose veins of bilateral lower extremities: Secondary | ICD-10-CM | POA: Diagnosis not present

## 2020-06-29 NOTE — Progress Notes (Signed)
Requested by:  Orma Flaming, [address] [place],  [place] [id number]  Reason for consultation: bilateral lower extremity spider veins  History of Present Illness   Jocelyn Parker is a 69 y.o. ([birth year]) female who presents for evaluation of bilateral lower extremity spider veins.  Patient believes her veins have become more prominent since she had surgery on June 2 for lung cancer.  She denies any swelling of bilateral lower extremities.  She is mainly concerned about the spider veins especially on her right distal thigh and behind both knees.  She denies any history of DVT, venous ulcerations, vascular interventions, or trauma.  She has not worn compression stockings in the past.  Spider veins are not causing her any pain however she is more concerned that there is some underlying cause.  She denies tobacco use.  She also denies any claudication, rest pain, or nonhealing wounds of bilateral lower extremities.  Past Medical History:  Diagnosis Date  . Allergy    sulfa  . Chronic interstitial cystitis   . Thyroid disease     Past Surgical History:  Procedure Laterality Date  . ABDOMINAL HYSTERECTOMY    . lumps removed from breast     1970's ,1990's and in 2000's  nono cancer     Social History   Socioeconomic History  . Marital status: Married    Spouse name: Not on file  . Number of children: Not on file  . Years of education: Not on file  . Highest education level: Not on file  Occupational History  . Not on file  Tobacco Use  . Smoking status: Never Smoker  . Smokeless tobacco: Never Used  Substance and Sexual Activity  . Alcohol use: No  . Drug use: No  . Sexual activity: Yes    Birth control/protection: Surgical    Comment: hyst  Other Topics Concern  . Not on file  Social History Narrative  . Not on file   Social Determinants of Health   Financial Resource Strain:   . Difficulty of Paying Living Expenses: Not on file  Food Insecurity:   .  Worried About Charity fundraiser in the Last Year: Not on file  . Ran Out of Food in the Last Year: Not on file  Transportation Needs:   . Lack of Transportation (Medical): Not on file  . Lack of Transportation (Non-Medical): Not on file  Physical Activity:   . Days of Exercise per Week: Not on file  . Minutes of Exercise per Session: Not on file  Stress:   . Feeling of Stress : Not on file  Social Connections:   . Frequency of Communication with Friends and Family: Not on file  . Frequency of Social Gatherings with Friends and Family: Not on file  . Attends Religious Services: Not on file  . Active Member of Clubs or Organizations: Not on file  . Attends Archivist Meetings: Not on file  . Marital Status: Not on file  Intimate Partner Violence:   . Fear of Current or Ex-Partner: Not on file  . Emotionally Abused: Not on file  . Physically Abused: Not on file  . Sexually Abused: Not on file    Family History  Problem Relation Age of Onset  . Cancer Mother        renal   . Heart attack Father   . Stroke Maternal Grandmother   . Heart attack Paternal Grandfather     Current Outpatient  Medications  Medication Sig Dispense Refill  . ergocalciferol (VITAMIN D2) 1.25 MG (50000 UT) capsule Take 1 capsule (50,000 Units total) by mouth once a week. (Patient taking differently: Take 50,000 Units by mouth every 30 (thirty) days. ) 12 capsule 4  . estradiol (VIVELLE-DOT) 0.1 MG/24HR Place 1 patch (0.1 mg total) onto the skin 2 (two) times a week. 8 patch 12  . levothyroxine (SYNTHROID) 50 MCG tablet Take 50 mcg by mouth daily before breakfast.    . levothyroxine (SYNTHROID, LEVOTHROID) 75 MCG tablet Take 75 mcg by mouth daily.      . baclofen (LIORESAL) 10 MG tablet Take 1 tablet (10 mg total) by mouth 3 (three) times daily as needed for muscle spasms. (Patient not taking: Reported on 06/29/2020) 30 each 0  . cyanocobalamin (,VITAMIN B-12,) 1000 MCG/ML injection Inject 1 mL  (1,000 mcg total) into the muscle every 30 (thirty) days. (Patient not taking: Reported on 06/29/2020) 10 mL 0  . cyanocobalamin 1000 MCG tablet Take 1,000 mcg by mouth every 30 (thirty) days. (Patient not taking: Reported on 06/29/2020)    . fluticasone (FLONASE) 50 MCG/ACT nasal spray SHAKE LIQUID AND USE 2 SPRAYS IN EACH NOSTRIL DAILY (Patient not taking: Reported on 06/29/2020) 16 g 2  . ibuprofen (ADVIL) 800 MG tablet ibuprofen 800 mg tablet (Patient not taking: Reported on 06/29/2020)    . magnesium 30 MG tablet Take by mouth. (Patient not taking: Reported on 06/29/2020)    . meclizine (ANTIVERT) 12.5 MG tablet meclizine 12.5 mg tablet (Patient not taking: Reported on 06/29/2020)    . Syringe/Needle, Disp, (SYRINGE 3CC/25GX1") 25G X 1" 3 ML MISC One injection monthly (Patient not taking: Reported on 06/29/2020) 12 each 0  . UNABLE TO FIND Med Name: CBD Cream (Patient not taking: Reported on 06/29/2020)     No current facility-administered medications for this visit.    Allergies  Allergen Reactions  . Sulfa Antibiotics Hives  . Montelukast Sodium     dizziness    REVIEW OF SYSTEMS (negative unless checked):   Cardiac:  []  Chest pain or chest pressure? []  Shortness of breath upon activity? []  Shortness of breath when lying flat? []  Irregular heart rhythm?  Vascular:  []  Pain in calf, thigh, or hip brought on by walking? []  Pain in feet at night that wakes you up from your sleep? []  Blood clot in your veins? []  Leg swelling?  Pulmonary:  []  Oxygen at home? []  Productive cough? []  Wheezing?  Neurologic:  []  Sudden weakness in arms or legs? []  Sudden numbness in arms or legs? []  Sudden onset of difficult speaking or slurred speech? []  Temporary loss of vision in one eye? []  Problems with dizziness?  Gastrointestinal:  []  Blood in stool? []  Vomited blood?  Genitourinary:  []  Burning when urinating? []  Blood in urine?  Psychiatric:  []  Major depression  Hematologic:    []  Bleeding problems? []  Problems with blood clotting?  Dermatologic:  []  Rashes or ulcers?  Constitutional:  []  Fever or chills?  Ear/Nose/Throat:  []  Change in hearing? []  Nose bleeds? []  Sore throat?  Musculoskeletal:  []  Back pain? []  Joint pain? []  Muscle pain?   Physical Examination     Vitals:   06/29/20 1252  BP: 101/68  Pulse: 68  Resp: 20  Temp: 98.7 F (37.1 C)  TempSrc: Temporal  SpO2: 100%  Weight: 113 lb 3.2 oz (51.3 kg)  Height: 5\' 3"  (1.6 m)   Body mass index is 20.05 kg/m.  General:  WDWN in NAD; vital signs documented above Gait: Not observed HENT: WNL, normocephalic Pulmonary: normal non-labored breathing , without Rales, rhonchi,  wheezing Cardiac: regular HR Abdomen: soft, NT, no masses Skin: without rashes Vascular Exam/Pulses:  Right Left  Radial 2+ (normal) 2+ (normal)  DP 1+ (weak) 2+ (normal)  PT absent 1+ (weak)   Extremities: No varicosities or venous ulcerations noted; spider veins of right distal thigh and behind both knees; no significant edema noted bilateral lower extremities Musculoskeletal: no muscle wasting or atrophy  Neurologic: A&O X 3;  No focal weakness or paresthesias are detected Psychiatric:  The pt has Normal affect.  Non-invasive Vascular Imaging   BLE Venous Insufficiency Duplex :   RLE:   Negative for DVT and SVT,   Negative for GSV reflux,  Negative for SSV reflux,  Negative for deep venous reflux   LLE:  Negative for  DVT and SVT,   Negative for GSV reflux,   Negative for  SSV reflux,  Mid femoral vein deep venous reflux   Medical Decision Making   Jocelyn Parker is a 69 y.o. female who presents for evaluation of spider veins of bilateral lower extremities which are asymptomatic   Bilateral lower extremity venous reflux study was negative for DVT and superficial reflux of bilateral lower extremities; only mild deep reflux was noted in mid femoral vein of the left lower  extremity  No edema noted on exam, I do not see a significant benefit even wearing compression stockings at this time  Patient does have some spider veins especially in the distal right thigh and behind both knees  She will be evaluated by Estell Harpin, RN to see if she is interested in sclerotherapy  Otherwise patient can follow-up on an as-needed basis   Dagoberto Ligas, PA-C [facility] Office: [phone]  06/30/2020, 8:44 AM  Clinic MD: Dr. Oneida Alar

## 2020-06-30 ENCOUNTER — Encounter: Payer: Self-pay | Admitting: Physician Assistant

## 2020-07-14 LAB — HM DEXA SCAN

## 2020-07-26 ENCOUNTER — Other Ambulatory Visit: Payer: Self-pay | Admitting: Internal Medicine

## 2020-07-26 DIAGNOSIS — E7849 Other hyperlipidemia: Secondary | ICD-10-CM

## 2020-07-29 LAB — LIPID PANEL
Chol/HDL Ratio: 4.7 ratio — ABNORMAL HIGH (ref 0.0–4.4)
Cholesterol, Total: 245 mg/dL — ABNORMAL HIGH (ref 100–199)
HDL: 52 mg/dL (ref >39)
LDL Chol Calc (NIH): 169 mg/dL — ABNORMAL HIGH (ref 0–99)
Triglycerides: 132 mg/dL (ref 0–149)
VLDL Cholesterol Cal: 24 mg/dL (ref 5–40)

## 2020-07-31 ENCOUNTER — Ambulatory Visit (INDEPENDENT_AMBULATORY_CARE_PROVIDER_SITE_OTHER): Payer: Medicare Other

## 2020-07-31 DIAGNOSIS — Z0000 Encounter for general adult medical examination without abnormal findings: Secondary | ICD-10-CM | POA: Diagnosis not present

## 2020-07-31 NOTE — Progress Notes (Signed)
Virtual Visit via Telephone Note  I connected with  Jocelyn Parker on 07/31/20 at  8:45 AM EDT by telephone and verified that I am speaking with the correct person using two identifiers.  Medicare Annual Wellness visit completed telephonically due to Covid-19 pandemic.   Persons participating in this call: This Health Coach and this patient.   Location: Patient: Home Provider: Office   I discussed the limitations, risks, security and privacy concerns of performing an evaluation and management service by telephone and the availability of in person appointments. The patient expressed understanding and agreed to proceed.  Unable to perform video visit due to video visit attempted and failed and/or patient does not have video capability.   Some vital signs may be absent or patient reported.   [name], LPN    Subjective:   Jocelyn Parker is a 69 y.o. female who presents for an Initial Medicare Annual Wellness Visit.  Review of Systems     Cardiac Risk Factors include: advanced age (>92men, >63 women);dyslipidemia     Objective:    Today's Vitals   07/31/20 0843  PainSc: 3    There is no height or weight on file to calculate BMI.  Advanced Directives 07/31/2020  Does Patient Have a Medical Advance Directive? Yes  Type of Paramedic of [place];Living will  Copy of [place] in Chart? No - copy requested    Current Medications (verified) Outpatient Encounter Medications as of 07/31/2020  Medication Sig   ergocalciferol (VITAMIN D2) 1.25 MG (50000 UT) capsule Take 1 capsule (50,000 Units total) by mouth once a week. (Patient taking differently: Take 50,000 Units by mouth every 30 (thirty) days. )   estradiol (VIVELLE-DOT) 0.1 MG/24HR Place 1 patch (0.1 mg total) onto the skin 2 (two) times a week.   fluticasone (FLONASE) 50 MCG/ACT nasal spray SHAKE LIQUID AND USE 2 SPRAYS IN EACH NOSTRIL DAILY   ibuprofen (ADVIL) 800 MG  tablet ibuprofen 800 mg tablet   levothyroxine (SYNTHROID) 50 MCG tablet Take 50 mcg by mouth daily before breakfast.   levothyroxine (SYNTHROID, LEVOTHROID) 75 MCG tablet Take 75 mcg by mouth daily.     meclizine (ANTIVERT) 12.5 MG tablet meclizine 12.5 mg tablet   UNABLE TO FIND Med Name: CBD Cream   baclofen (LIORESAL) 10 MG tablet Take 1 tablet (10 mg total) by mouth 3 (three) times daily as needed for muscle spasms. (Patient not taking: Reported on 06/29/2020)   cyanocobalamin (,VITAMIN B-12,) 1000 MCG/ML injection Inject 1 mL (1,000 mcg total) into the muscle every 30 (thirty) days. (Patient not taking: Reported on 07/31/2020)   cyanocobalamin 1000 MCG tablet Take 1,000 mcg by mouth every 30 (thirty) days. (Patient not taking: Reported on 06/29/2020)   magnesium 30 MG tablet Take by mouth. (Patient not taking: Reported on 06/29/2020)   Syringe/Needle, Disp, (SYRINGE 3CC/25GX1") 25G X 1" 3 ML MISC One injection monthly (Patient not taking: Reported on 06/29/2020)   No facility-administered encounter medications on file as of 07/31/2020.    Allergies (verified) Sulfa antibiotics and Montelukast sodium   History: Past Medical History:  Diagnosis Date   Allergy    sulfa   Chronic interstitial cystitis    Thyroid disease    Past Surgical History:  Procedure Laterality Date   ABDOMINAL HYSTERECTOMY     lumps removed from breast     1970's ,1990's and in 2000's  nono cancer    Family History  Problem Relation Age of Onset  Cancer Mother        renal    Heart attack Father    Stroke Maternal Grandmother    Heart attack Paternal Grandfather    Social History   Socioeconomic History   Marital status: Married    Spouse name: Not on file   Number of children: Not on file   Years of education: Not on file   Highest education level: Not on file  Occupational History   Occupation: Retired  Tobacco Use   Smoking status: Never Smoker   Smokeless tobacco:  Never Used  Substance and Sexual Activity   Alcohol use: No   Drug use: No   Sexual activity: Yes    Birth control/protection: Surgical    Comment: hyst  Other Topics Concern   Not on file  Social History Narrative   Not on file   Social Determinants of Health   Financial Resource Strain: Low Risk    Difficulty of Paying Living Expenses: Not hard at all  Food Insecurity: No Food Insecurity   Worried About Charity fundraiser in the Last Year: Never true   [place] in the Last Year: Never true  Transportation Needs: No Transportation Needs   Lack of Transportation (Medical): No   Lack of Transportation (Non-Medical): No  Physical Activity: Sufficiently Active   Days of Exercise per Week: 4 days   Minutes of Exercise per Session: 40 min  Stress: No Stress Concern Present   Feeling of Stress : Not at all  Social Connections: Socially Integrated   Frequency of Communication with Friends and Family: More than three times a week   Frequency of Social Gatherings with Friends and Family: More than three times a week   Attends Religious Services: More than 4 times per year   Active Member of [employer] or Organizations: Yes   Attends Archivist Meetings: 1 to 4 times per year   Marital Status: Married    Tobacco Counseling Counseling given: Not Answered   Clinical Intake:  Pre-visit preparation completed: Yes  Pain : 0-10 (soreness under breast from chest tube) Pain Score: 3  Pain Type: Chronic pain Pain Location: Breast (right area) Pain Descriptors / Indicators: Sore     BMI - recorded: 20.06 Nutritional Status: BMI of 19-24  Normal Nutritional Risks: None Diabetes: No  How often do you need to have someone help you when you read instructions, pamphlets, or other written materials from your doctor or pharmacy?: 1 - Never  Diabetic?No  Interpreter Needed?: No  Information entered by :: Charlott Rakes, LPN   Activities of Daily  Living In your present state of health, do you have any difficulty performing the following activities: 07/31/2020  Hearing? Y  Comment mild Decrtease in left ear  Vision? N  Difficulty concentrating or making decisions? N  Walking or climbing stairs? N  Dressing or bathing? N  Doing errands, shopping? N  Preparing Food and eating ? N  Using the Toilet? N  In the past six months, have you accidently leaked urine? N  Do you have problems with loss of bowel control? N  Managing your Medications? N  Managing your Finances? N  Housekeeping or managing your Housekeeping? N  Some recent data might be hidden    Patient Care Team: Orma Flaming, MD as PCP - General (Family Medicine) Jacelyn Pi, MD as Referring Physician (Endocrinology) Ronald Lobo, MD as Consulting Physician (Gastroenterology) Domingo Pulse, MD (Urology) Debara Pickett Nadean Corwin, MD  as Consulting Physician (Cardiology)  Indicate any recent Medical Services you may have received from other than [facility] providers in the past year (date may be approximate).     Assessment:   This is a routine wellness examination for [place].  Hearing/Vision screen  Hearing Screening   125Hz  250Hz  500Hz  1000Hz  2000Hz  3000Hz  4000Hz  6000Hz  8000Hz   Right ear:           Left ear:           Comments: Pt stated some decrease in left ear related to ringing in ears at times  Vision Screening Comments: Pt follows up with Dr Sammuel Hines  Dietary issues and exercise activities discussed: Current Exercise Habits: Home exercise routine, Type of exercise: walking, Time (Minutes): 45, Frequency (Times/Week): 4, Weekly Exercise (Minutes/Week): 180  Goals     Patient Stated     Get back in shape by walking      Depression Screen PHQ 2/9 Scores 07/31/2020 05/19/2020 03/06/2020 09/09/2019 07/28/2019 04/02/2019  PHQ - 2 Score 0 0 0 0 0 0  PHQ- 9 Score - - - 0 2 5    Fall Risk Fall Risk  07/31/2020 05/19/2020 03/06/2020 04/02/2019  Falls in the past  year? 0 0 0 0  Number falls in past yr: 0 - - 0  Injury with Fall? 0 - - 0  Risk for fall due to : Impaired vision;Impaired balance/gait No Fall Risks No Fall Risks -  Follow up Falls prevention discussed - - -    Any stairs in or around the home? Yes  If so, are there any without handrails? No  Home free of loose throw rugs in walkways, pet beds, electrical cords, etc? Yes  Adequate lighting in your home to reduce risk of falls? Yes   ASSISTIVE DEVICES UTILIZED TO PREVENT FALLS:  Life alert? No  Use of a cane, walker or w/c? No  Grab bars in the bathroom? No  Shower chair or bench in shower? Yes Elevated toilet seat or a handicapped toilet? Yes   TIMED UP AND GO:  Was the test performed? No .   Cognitive Function:     6CIT Screen 07/31/2020  What Year? 0 points  What month? 0 points  Count back from 20 0 points  Months in reverse 0 points  Repeat phrase 0 points    Immunizations Immunization History  Administered Date(s) Administered   PFIZER SARS-COV-2 Vaccination 12/03/2019, 12/31/2019   PPD Test 11/27/2015   Tdap 07/22/2008    TDAP status: Due, Education has been provided regarding the importance of this vaccine. Advised may receive this vaccine at local pharmacy or Health Dept. Aware to provide a copy of the vaccination record if obtained from local pharmacy or Health Dept. Verbalized acceptance and understanding. Flu Vaccine status: Declined, Education has been provided regarding the importance of this vaccine but patient still declined. Advised may receive this vaccine at local pharmacy or Health Dept. Aware to provide a copy of the vaccination record if obtained from local pharmacy or Health Dept. Verbalized acceptance and understanding. Pneumococcal vaccine status: Declined,  Education has been provided regarding the importance of this vaccine but patient still declined. Advised may receive this vaccine at local pharmacy or Health Dept. Aware to provide a copy  of the vaccination record if obtained from local pharmacy or Health Dept. Verbalized acceptance and understanding.  Covid-19 vaccine status: Completed vaccines  Qualifies for Shingles Vaccine? Yes   Zostavax completed No   Shingrix Completed?: No.  Education has been provided regarding the importance of this vaccine. Patient has been advised to call insurance company to determine out of pocket expense if they have not yet received this vaccine. Advised may also receive vaccine at local pharmacy or Health Dept. Verbalized acceptance and understanding.  Screening Tests Health Maintenance  Topic Date Due   COLONOSCOPY  Never done   DEXA SCAN  Never done   TETANUS/TDAP  07/22/2018   MAMMOGRAM  12/05/2021   COVID-19 Vaccine  Completed   Hepatitis C Screening  Completed   INFLUENZA VACCINE  Discontinued   PNA vac Low Risk Adult  Discontinued    Health Maintenance  Health Maintenance Due  Topic Date Due   COLONOSCOPY  Never done   DEXA SCAN  Never done   TETANUS/TDAP  07/22/2018    Colorectal cancer screening: Completed 05/12/13. Repeat every 10 years Mammogram status: Completed 12/06/19. Repeat every year Bone Density status: Completed 07/14/20 pt stated. Results reflect: Bone density results: OSTEOPENIA. Repeat every 2 years.   Additional Screening:  Hepatitis C Screening: qualify; Completed 04/05/19  Vision Screening: Recommended annual ophthalmology exams for early detection of glaucoma and other disorders of the eye. Is the patient up to date with their annual eye exam?  Yes  Who is the provider or what is the name of the office in which the patient attends annual eye exams? Dr Herbert Deaner  Dental Screening: Recommended annual dental exams for proper oral hygiene  Community Resource Referral / Chronic Care Management: CRR required this visit?  No   CCM required this visit?  No      Plan:     I have personally reviewed and noted the following in the patients  chart:    Medical and social history  Use of alcohol, tobacco or illicit drugs   Current medications and supplements  Functional ability and status  Nutritional status  Physical activity  Advanced directives  List of other physicians  Hospitalizations, surgeries, and ER visits in previous 12 months  Vitals  Screenings to include cognitive, depression, and falls  Referrals and appointments  In addition, I have reviewed and discussed with patient certain preventive protocols, quality metrics, and Parker practice recommendations. A written personalized care plan for preventive services as well as general preventive health recommendations were provided to patient.     [name], LPN   05/10/2375   Nurse Notes: None

## 2020-07-31 NOTE — Patient Instructions (Addendum)
Jocelyn Parker , Thank you for taking time to come for your Medicare Wellness Visit. I appreciate your ongoing commitment to your health goals. Please review the following plan we discussed and let me know if I can assist you in the future.   Screening recommendations/referrals: Colonoscopy: Done 05/12/2013 Mammogram: Done 12/06/19 Bone Density: Pt stated 07/14/20 Recommended yearly ophthalmology/optometry visit for glaucoma screening and checkup Recommended yearly dental visit for hygiene and checkup  Vaccinations: Influenza vaccine: Declined and discussed Pneumococcal vaccine: Declined and discussed Tdap vaccine: Due and discussed  Shingles vaccine: Shingrix discussed. Please contact your pharmacy for coverage information.    Covid-19:Completed 1/29 & 12/31/19  Advanced directives: Please bring a copy of your health care power of attorney and living will to the office at your convenience.  Conditions/risks identified: Pt will continue walking for exercise  Next appointment: Follow up in one year for your annual wellness visit    Preventive Care 65 Years and Older, Female Preventive care refers to lifestyle choices and visits with your health care provider that can promote health and wellness. What does preventive care include?  A yearly physical exam. This is also called an annual well check.  Dental exams once or twice a year.  Routine eye exams. Ask your health care provider how often you should have your eyes checked.  Personal lifestyle choices, including:  Daily care of your teeth and gums.  Regular physical activity.  Eating a healthy diet.  Avoiding tobacco and drug use.  Limiting alcohol use.  Practicing safe sex.  Taking low-dose aspirin every day.  Taking vitamin and mineral supplements as recommended by your health care provider. What happens during an annual well check? The services and screenings done by your health care provider during your annual well check  will depend on your age, overall health, lifestyle risk factors, and family history of disease. Counseling  Your health care provider may ask you questions about your:  Alcohol use.  Tobacco use.  Drug use.  Emotional well-being.  Home and relationship well-being.  Sexual activity.  Eating habits.  History of falls.  Memory and ability to understand (cognition).  Work and work Statistician.  Reproductive health. Screening  You may have the following tests or measurements:  Height, weight, and BMI.  Blood pressure.  Lipid and cholesterol levels. These may be checked every 5 years, or more frequently if you are over 47 years old.  Skin check.  Lung cancer screening. You may have this screening every year starting at age 38 if you have a 30-pack-year history of smoking and currently smoke or have quit within the past 15 years.  Fecal occult blood test (FOBT) of the stool. You may have this test every year starting at age 100.  Flexible sigmoidoscopy or colonoscopy. You may have a sigmoidoscopy every 5 years or a colonoscopy every 10 years starting at age 35.  Hepatitis C blood test.  Hepatitis B blood test.  Sexually transmitted disease (STD) testing.  Diabetes screening. This is done by checking your blood sugar (glucose) after you have not eaten for a while (fasting). You may have this done every 1-3 years.  Bone density scan. This is done to screen for osteoporosis. You may have this done starting at age 10.  Mammogram. This may be done every 1-2 years. Talk to your health care provider about how often you should have regular mammograms. Talk with your health care provider about your test results, treatment options, and if necessary, the need  for more tests. Vaccines  Your health care provider may recommend certain vaccines, such as:  Influenza vaccine. This is recommended every year.  Tetanus, diphtheria, and acellular pertussis (Tdap, Td) vaccine. You may  need a Td booster every 10 years.  Zoster vaccine. You may need this after age 103.  Pneumococcal 13-valent conjugate (PCV13) vaccine. One dose is recommended after age 8.  Pneumococcal polysaccharide (PPSV23) vaccine. One dose is recommended after age 90. Talk to your health care provider about which screenings and vaccines you need and how often you need them. This information is not intended to replace advice given to you by your health care provider. Make sure you discuss any questions you have with your health care provider. Document Released: 11/17/2015 Document Revised: 07/10/2016 Document Reviewed: 08/22/2015 Elsevier Interactive Patient Education  2017 [employer] Prevention in the Home Falls can cause injuries. They can happen to people of all ages. There are many things you can do to make your home safe and to help prevent falls. What can I do on the outside of my home?  Regularly fix the edges of walkways and driveways and fix any cracks.  Remove anything that might make you trip as you walk through a door, such as a raised step or threshold.  Trim any bushes or trees on the path to your home.  Use bright outdoor lighting.  Clear any walking paths of anything that might make someone trip, such as rocks or tools.  Regularly check to see if handrails are loose or broken. Make sure that both sides of any steps have handrails.  Any raised decks and porches should have guardrails on the edges.  Have any leaves, snow, or ice cleared regularly.  Use sand or salt on walking paths during winter.  Clean up any spills in your garage right away. This includes oil or grease spills. What can I do in the bathroom?  Use night lights.  Install grab bars by the toilet and in the tub and shower. Do not use towel bars as grab bars.  Use non-skid mats or decals in the tub or shower.  If you need to sit down in the shower, use a plastic, non-slip stool.  Keep the floor  dry. Clean up any water that spills on the floor as soon as it happens.  Remove soap buildup in the tub or shower regularly.  Attach bath mats securely with double-sided non-slip rug tape.  Do not have throw rugs and other things on the floor that can make you trip. What can I do in the bedroom?  Use night lights.  Make sure that you have a light by your bed that is easy to reach.  Do not use any sheets or blankets that are too big for your bed. They should not hang down onto the floor.  Have a firm chair that has side arms. You can use this for support while you get dressed.  Do not have throw rugs and other things on the floor that can make you trip. What can I do in the kitchen?  Clean up any spills right away.  Avoid walking on wet floors.  Keep items that you use a lot in easy-to-reach places.  If you need to reach something above you, use a strong step stool that has a grab bar.  Keep electrical cords out of the way.  Do not use floor polish or wax that makes floors slippery. If you must use wax, use  non-skid floor wax.  Do not have throw rugs and other things on the floor that can make you trip. What can I do with my stairs?  Do not leave any items on the stairs.  Make sure that there are handrails on both sides of the stairs and use them. Fix handrails that are broken or loose. Make sure that handrails are as long as the stairways.  Check any carpeting to make sure that it is firmly attached to the stairs. Fix any carpet that is loose or worn.  Avoid having throw rugs at the top or bottom of the stairs. If you do have throw rugs, attach them to the floor with carpet tape.  Make sure that you have a light switch at the top of the stairs and the bottom of the stairs. If you do not have them, ask someone to add them for you. What else can I do to help prevent falls?  Wear shoes that:  Do not have high heels.  Have rubber bottoms.  Are comfortable and fit you  well.  Are closed at the toe. Do not wear sandals.  If you use a stepladder:  Make sure that it is fully opened. Do not climb a closed stepladder.  Make sure that both sides of the stepladder are locked into place.  Ask someone to hold it for you, if possible.  Clearly mark and make sure that you can see:  Any grab bars or handrails.  First and last steps.  Where the edge of each step is.  Use tools that help you move around (mobility aids) if they are needed. These include:  Canes.  Walkers.  Scooters.  Crutches.  Turn on the lights when you go into a dark area. Replace any light bulbs as soon as they burn out.  Set up your furniture so you have a clear path. Avoid moving your furniture around.  If any of your floors are uneven, fix them.  If there are any pets around you, be aware of where they are.  Review your medicines with your doctor. Some medicines can make you feel dizzy. This can increase your chance of falling. Ask your doctor what other things that you can do to help prevent falls. This information is not intended to replace advice given to you by your health care provider. Make sure you discuss any questions you have with your health care provider. Document Released: 08/17/2009 Document Revised: 03/28/2016 Document Reviewed: 11/25/2014 Elsevier Interactive Patient Education  2017 [employer].

## 2020-08-04 ENCOUNTER — Telehealth (INDEPENDENT_AMBULATORY_CARE_PROVIDER_SITE_OTHER): Payer: Medicare Other | Admitting: Internal Medicine

## 2020-08-04 ENCOUNTER — Encounter: Payer: Self-pay | Admitting: Internal Medicine

## 2020-08-04 VITALS — BP 114/78 | Ht 63.0 in | Wt 116.0 lb

## 2020-08-04 DIAGNOSIS — R931 Abnormal findings on diagnostic imaging of heart and coronary circulation: Secondary | ICD-10-CM | POA: Diagnosis not present

## 2020-08-04 DIAGNOSIS — M791 Myalgia, unspecified site: Secondary | ICD-10-CM

## 2020-08-04 DIAGNOSIS — E7849 Other hyperlipidemia: Secondary | ICD-10-CM

## 2020-08-04 DIAGNOSIS — T466X5A Adverse effect of antihyperlipidemic and antiarteriosclerotic drugs, initial encounter: Secondary | ICD-10-CM | POA: Diagnosis not present

## 2020-08-04 DIAGNOSIS — I6389 Other cerebral infarction: Secondary | ICD-10-CM | POA: Diagnosis not present

## 2020-08-04 NOTE — Patient Instructions (Signed)
Medication Instructions:  Your physician recommends that you continue on your current medications as directed. Please refer to the Current Medication list given to you today.  *If you need a refill on your cardiac medications before your next appointment, please call your pharmacy*   Lab Work: FASTING lab work in 6 months (complete at least 2 days prior to next appointment with Dr. Debara Pickett)  If you have labs (blood work) drawn today and your tests are completely normal, you will receive your results only by:  MyChart Message (if you have [place]) OR  A paper copy in the mail If you have any lab test that is abnormal or we need to change your treatment, we will call you to review the results.   Testing/Procedures: NONE   Follow-Up: At [facility], you and your health needs are our priority.  As part of our continuing mission to provide you with exceptional heart care, we have created designated Provider Care Teams.  These Care Teams include your primary Cardiologist (physician) and Advanced Practice Providers (APPs -  Physician Assistants and Nurse Practitioners) who all work together to provide you with the care you need, when you need it.  We recommend signing up for the patient portal called "MyChart".  Sign up information is provided on this After Visit Summary.  MyChart is used to connect with patients for Virtual Visits (Telemedicine).  Patients are able to view lab/test results, encounter notes, upcoming appointments, etc.  Non-urgent messages can be sent to your provider as well.   To learn more about what you can do with MyChart, go to [email or web address].    Your next appointment:   6 month(s) - lipid clinic  The format for your next appointment:   In Person or Virtual  Provider:   K. Mali Hilty, MD   Other Instructions

## 2020-08-04 NOTE — Progress Notes (Signed)
Virtual Visit via Telephone Note   This visit type was conducted due to national recommendations for restrictions regarding the COVID-19 Pandemic (e.g. social distancing) in an effort to limit this patient's exposure and mitigate transmission in our community.  Due to her co-morbid illnesses, this patient is at least at moderate risk for complications without adequate follow up.  This format is felt to be most appropriate for this patient at this time.  The patient did not have access to video technology/had technical difficulties with video requiring transitioning to audio format only (telephone).  All issues noted in this document were discussed and addressed.  No physical exam could be performed with this format.  Please refer to the patient's chart for her  consent to telehealth for [facility].   Date:  08/04/2020   ID:  Jocelyn Parker, DOB 06/30/1951, MRN [id number] The patient was identified using 2 identifiers.  Evaluation Performed:  Follow-Up Visit  Patient Location:  [place] [address]  Provider location:   [address], [place], [place] [address]  PCP:  Orma Flaming, MD  Cardiologist:  No primary care provider on file. Electrophysiologist:  None   Chief Complaint:  Follow-up  History of Present Illness:    Jocelyn Parker is a 69 y.o. female who presents via audio/video conferencing for a telehealth visit today.  Jocelyn Parker is a 69 y.o. female with a past medial history significant for longstanding dyslipidemia.  Is also a family history of heart disease and dyslipidemia particular on her father side.  Multiple family members with early onset heart disease.  She reports an unfortunate intolerance to statins over a number of years.  She has tried most of the high potency statins such as Lipitor and Crestor as well as simvastatin.  More recently she underwent CT calcium scoring through [facility] which was a score 45.  This places her in  the 50-60th percentile based on age and sex matched controls.  Today she is here to discuss those findings and consider treatment options.  I explained to her the importance of abnormal coronary calcium and the relevance given her family history.  I am concerned with LDL now of close to 190 or higher that is likely that she has familial hyperlipidemia.  This would fit with her family history of heart disease.  She does have a number of siblings who have high cholesterol but she is not aware if they have had prior coronary disease.  She does not have any children.  Currently she is asymptomatic and is physically active.  She says she eats a very healthy diet and reduces gluten and other sources of dietary cholesterol.  08/04/2020  Jocelyn Parker is seen today in follow-up.  Unfortunately in the interim between when I saw her in our she was diagnosed with a lung cancer.  She underwent wedge resection at [facility] in it does not seem like she will need any additional chemotherapy.  She seems to be recovering from that.  We have not gone ahead and addressed her cardiac concerns.  She did have a abnormal calcium score which was 45 at [facility], 50-60th percentile.  I shared with her that I would like her to have a lower cholesterol than she currently has.  She is not on therapy at this point.  She would like to make more dietary changes and lifestyle changes first however rather than consider medications.  The patient does not have symptoms concerning for COVID-19 infection (fever,  chills, cough, or new SHORTNESS OF BREATH).    Prior CV studies:   The following studies were reviewed today:  Chart reviewed, notes from [facility]  PMHx:  Past Medical History:  Diagnosis Date  . Allergy    sulfa  . Chronic interstitial cystitis   . Thyroid disease     Past Surgical History:  Procedure Laterality Date  . ABDOMINAL HYSTERECTOMY    . lumps removed from breast     1970's ,1990's and in 2000's  nono cancer      FAMHx:  Family History  Problem Relation Age of Onset  . Cancer Mother        renal   . Heart attack Father   . Stroke Maternal Grandmother   . Heart attack Paternal Grandfather     SOCHx:   reports that she has never smoked. She has never used smokeless tobacco. She reports that she does not drink alcohol and does not use drugs.  ALLERGIES:  Allergies  Allergen Reactions  . Sulfa Antibiotics Hives  . Montelukast Sodium     dizziness    MEDS:  Current Meds  Medication Sig  . ergocalciferol (VITAMIN D2) 1.25 MG (50000 UT) capsule Take 1 capsule (50,000 Units total) by mouth once a week. (Patient taking differently: Take 50,000 Units by mouth every 30 (thirty) days. )  . estradiol (VIVELLE-DOT) 0.1 MG/24HR Place 1 patch (0.1 mg total) onto the skin 2 (two) times a week.  . fluticasone (FLONASE) 50 MCG/ACT nasal spray SHAKE LIQUID AND USE 2 SPRAYS IN EACH NOSTRIL DAILY  . ibuprofen (ADVIL) 800 MG tablet ibuprofen 800 mg tablet  . levothyroxine (SYNTHROID) 50 MCG tablet Take 50 mcg by mouth daily before breakfast.  . levothyroxine (SYNTHROID, LEVOTHROID) 75 MCG tablet Take 75 mcg by mouth daily.    . magnesium 30 MG tablet Take by mouth.   . meclizine (ANTIVERT) 12.5 MG tablet meclizine 12.5 mg tablet  . Syringe/Needle, Disp, (SYRINGE 3CC/25GX1") 25G X 1" 3 ML MISC One injection monthly  . UNABLE TO FIND Med Name: CBD Cream     ROS: Pertinent items noted in HPI and remainder of comprehensive ROS otherwise negative.  Labs/Other Tests and Data Reviewed:    Recent Labs: 05/19/2020: ALT 17; BUN 12; Creatinine, Ser 0.74; Hemoglobin 12.8; Platelets 245.0; Potassium 4.2; Sodium 139; TSH 3.77   Recent Lipid Panel Lab Results  Component Value Date/Time   CHOL 245 (H) 07/28/2020 02:33 PM   TRIG 132 07/28/2020 02:33 PM   HDL 52 07/28/2020 02:33 PM   CHOLHDL 4.7 (H) 07/28/2020 02:33 PM   CHOLHDL 5 05/12/2019 10:21 AM   LDLCALC 169 (H) 07/28/2020 02:33 PM    Wt Readings  from Last 3 Encounters:  08/04/20 116 lb (52.6 kg)  06/29/20 113 lb 3.2 oz (51.3 kg)  05/25/20 114 lb (51.7 kg)     Exam:    Vital Signs:  BP 114/78   Ht 5\' 3"  (1.6 m)   Wt 116 lb (52.6 kg)   BMI 20.55 kg/m    Exam not performed due to telephone visit  ASSESSMENT & PLAN:    1. Possible familial hyperlipidemia-Dutch score of 4 2. Abnormal CAC score 45 (01/2020) 3. Family history of coronary disease on her father side 4. Statin intolerance-myalgias 5. Recent pulmonary neoplasm status post wedge resection  Jocelyn Parker has significantly elevated lipids.  They have recently improved slightly with dietary changes and more activity and additionally she underwent wedge resection for a primary lung  cancer.  Her total cholesterol now is 245, triglycerides 132, HDL 52 and LDL of 169.  Based on elevated coronary calcium score, I would recommend therapy since she has been on no medications.  We discussed possible treatment options including ezetimibe or a PCSK9 inhibitor given her statin intolerance however she would like to work on diet and more activity first.  We will plan on repeat lipids in 6 months per her request and follow-up at that time.Marland Kitchen  COVID-19 Education: The signs and symptoms of COVID-19 were discussed with the patient and how to seek care for testing (follow up with PCP or arrange E-visit).  The importance of social distancing was discussed today.  Patient Risk:   After full review of this patients clinical status, I feel that they are at least moderate risk at this time.  Time:   Today, I have spent 15 minutes with the patient with telehealth technology discussing dyslipidemia.     Medication Adjustments/Labs and Tests Ordered: Current medicines are reviewed at length with the patient today.  Concerns regarding medicines are outlined above.   Tests Ordered: Orders Placed This Encounter  Procedures  . Lipid panel    Medication Changes: No orders of the defined types  were placed in this encounter.   Disposition:  in 6 month(s)  Pixie Casino, MD, [facility] [place], [place] Director of the Advanced Lipid Disorders &  Cardiovascular Risk Reduction Clinic Diplomate of the American Board of Clinical Lipidology Attending Cardiologist  Direct Dial: [phone]  Fax: [phone]  Website:  [email or web address]  Pixie Casino, MD  08/04/2020 2:01 PM

## 2020-08-10 ENCOUNTER — Ambulatory Visit (INDEPENDENT_AMBULATORY_CARE_PROVIDER_SITE_OTHER): Payer: Medicare Other | Admitting: Family Medicine

## 2020-08-10 ENCOUNTER — Other Ambulatory Visit: Payer: Self-pay

## 2020-08-10 ENCOUNTER — Encounter: Payer: Self-pay | Admitting: Family Medicine

## 2020-08-10 VITALS — BP 112/70 | HR 62 | Temp 97.9°F | Ht 63.0 in | Wt 113.8 lb

## 2020-08-10 DIAGNOSIS — E538 Deficiency of other specified B group vitamins: Secondary | ICD-10-CM

## 2020-08-10 LAB — VITAMIN B12: Vitamin B-12: 684 pg/mL (ref 200–1100)

## 2020-08-10 NOTE — Progress Notes (Signed)
Patient: Jocelyn Parker MRN: [id number] DOB: August 21, 1951 PCP: Orma Flaming, MD     Subjective:  Chief Complaint  Patient presents with  . Follow-up    [facility] Wellness visit ; labs    HPI: The patient is a 69 y.o. female who presents today for labs. Pt says that it is a follow up from [facility] visit. She does not specifically what the appointment is for.   b12 deficiency Would like labs checked.   Muscle cramping -I gave her baclofen, but she looked up medication and didn't want to start them. She states the cramping is tolerrable.    Review of Systems  Constitutional: Negative for fatigue and fever.  HENT: Negative for congestion and sore throat.   Respiratory: Negative for cough and shortness of breath.   Genitourinary: Negative for flank pain, frequency and urgency.    Allergies Patient is allergic to sulfa antibiotics and montelukast sodium.  Past Medical History Patient  has a past medical history of Allergy, Chronic interstitial cystitis, and Thyroid disease.  Surgical History Patient  has a past surgical history that includes Abdominal hysterectomy and lumps removed from breast.  Family History Pateint's family history includes Cancer in her mother; Heart attack in her father and paternal grandfather; Stroke in her maternal grandmother.  Social History Patient  reports that she has never smoked. She has never used smokeless tobacco. She reports that she does not drink alcohol and does not use drugs.    Objective: Vitals:   08/10/20 1408  BP: 112/70  Pulse: 62  Temp: 97.9 F (36.6 C)  TempSrc: Temporal  SpO2: 98%  Weight: 113 lb 12.8 oz (51.6 kg)  Height: 5\' 3"  (1.6 m)    Body mass index is 20.16 kg/m.  Physical Exam Vitals reviewed.  Constitutional:      Appearance: Normal appearance. She is normal weight.  HENT:     Head: Normocephalic.  Cardiovascular:     Rate and Rhythm: Normal rate and regular rhythm.     Heart sounds: Normal heart  sounds.  Pulmonary:     Effort: Pulmonary effort is normal.     Breath sounds: Normal breath sounds.  Abdominal:     General: Abdomen is flat. Bowel sounds are normal.     Palpations: Abdomen is soft.  Skin:    General: Skin is warm.     Capillary Refill: Capillary refill takes less than 2 seconds.  Neurological:     General: No focal deficit present.     Mental Status: She is alert and oriented to person, place, and time.  Psychiatric:        Mood and Affect: Mood normal.        Behavior: Behavior normal.        Assessment/plan: 1. B12 deficiency  -labs today. Doesn't really need appointment. Due back in July and if needs labs can just email me or follow up as needed.  Has appointment with rheum in a few months for +ANA.  She is doing exceptionally well and looks great.  - Vitamin B12; Future   This visit occurred during the SARS-CoV-2 public health emergency.  Safety protocols were in place, including screening questions prior to the visit, additional usage of staff PPE, and extensive cleaning of exam room while observing appropriate contact time as indicated for disinfecting solutions.      Return if symptoms worsen or fail to improve.    Orma Flaming, MD [place]   08/10/2020

## 2020-08-10 NOTE — Patient Instructions (Signed)
b12 level.  You look great.   You are good until next July for actual appointment. Just email me if you need labs. Don't need to come in for that.   Aw

## 2020-09-08 ENCOUNTER — Telehealth: Payer: Self-pay

## 2020-09-08 NOTE — Telephone Encounter (Signed)
  LAST APPOINTMENT DATE: 08/10/2020   NEXT APPOINTMENT DATE:@Visit  date not found  MEDICATION:Syringe/Needle, Disp, (SYRINGE 3CC/25GX1") 25G X 1" 3 ML MISC  PHARMACY: [id number] - [place], [place] - [address] AT [place] OF [address]  Comments: Patient is completely out   Please advise

## 2020-09-11 ENCOUNTER — Other Ambulatory Visit: Payer: Self-pay

## 2020-09-11 DIAGNOSIS — E538 Deficiency of other specified B group vitamins: Secondary | ICD-10-CM

## 2020-09-11 MED ORDER — "SYRINGE 25G X 1"" 3 ML MISC": 0 refills | Status: DC

## 2020-09-11 NOTE — Telephone Encounter (Signed)
Sent to pharmacy 

## 2020-10-09 NOTE — Progress Notes (Signed)
Office Visit Note  Patient: Jocelyn Parker             Date of Birth: 1951-10-09           MRN: [id number]             PCP: Orma Flaming, MD Referring: Orma Flaming, MD Visit Date: 10/19/2020 Occupation: @GUAROCC @  Subjective:  Pain and lower back pain.   History of Present Illness: Jocelyn Parker is a 69 y.o. female seen in consultation per request of her PCP.  According the patient she had her lobectomy for lung cancer on April 05, 2020.  After that she started having muscle pain.  She describes muscle pain in her bilateral arms, bilateral legs just above her knees and her calves and her shins.  She denies having any muscle cramps.  She describes achiness in her muscles.  She has not noticed any improvement in her symptoms since then.  She states she also woke up a week ago with severe pain in her lower back.  There is no history of injury.  She denies any radiculopathy.  She states she had MRI yesterday.  She has been experiencing some stiffness in her hands.  None of the other joints are painful.  Activities of Daily Living:  Patient reports morning stiffness for 0  minutes.   Patient Denies nocturnal pain.  Difficulty dressing/grooming: Reports Difficulty climbing stairs: Denies Difficulty getting out of chair: Reports Difficulty using hands for taps, buttons, cutlery, and/or writing: Denies  Review of Systems  Constitutional: Negative for fatigue.  HENT: Negative for mouth sores, mouth dryness and nose dryness.   Eyes: Positive for dryness. Negative for pain and itching.  Respiratory: Negative for cough, shortness of breath, wheezing and difficulty breathing.   Cardiovascular: Negative for chest pain and palpitations.  Gastrointestinal: Negative for blood in stool, constipation and diarrhea.  Endocrine: Negative for increased urination.  Genitourinary: Negative for difficulty urinating.  Musculoskeletal: Positive for myalgias, muscle tenderness and myalgias. Negative for  arthralgias, joint pain, joint swelling, muscle weakness and morning stiffness.  Skin: Negative for color change, rash and redness.  Allergic/Immunologic: Negative for susceptible to infections.  Neurological: Positive for numbness. Negative for dizziness, headaches, memory loss and weakness.  Hematological: Negative for bruising/bleeding tendency and swollen glands.  Psychiatric/Behavioral: Negative for confusion and sleep disturbance.    PMFS History:  Patient Active Problem List   Diagnosis Date Noted  . S/P partial lobectomy of lung 04/12/2020  . Nodule of upper lobe of right lung 04/05/2020  . Bilateral breast cysts 12/27/2019  . Malignant neoplasm of upper lobe of right lung ([place]) 10/12/2019  . Sensorineural hearing loss (SNHL) of both ears 06/20/2019  . Adenocarcinoma of lung, stage 1, right ([place]) 05/26/2019  . Tinnitus of both ears 05/12/2019  . B12 deficiency 04/11/2019  . Cyst of ovary 04/01/2019  . Osteopenia 04/01/2019  . Vitamin D deficiency 05/24/2015  . 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 Cyst of kidney, acquired 12/20/2014  . Interstitial cystitis (chronic) without hematuria 03/20/2012  . Bilateral fibrocystic breast disease (FCBD) 03/20/2012  . Hypothyroidism, followed by Dr. [name] 08/16/2008  . Hyperlipidemia, LDL 194, HDL 62 08/16/2008  . Shortness of breath 08/16/2008  . Nontoxic multinodular goiter 08/08/2007    Past Medical History:  Diagnosis Date  . Allergy    sulfa  . Chronic interstitial cystitis   . Lung cancer ([place])    per patient   . Thyroid disease     Family History  Problem Relation Age of Onset  . Renal cancer Mother   . Heart attack Father   . Stroke Maternal Grandmother   . Heart attack Paternal Grandfather   . Multiple sclerosis Sister   . Cancer Brother    Past Surgical History:  Procedure Laterality Date  . ABDOMINAL HYSTERECTOMY    . lumps removed from breast     1970's ,1990's and in 2000's  nono cancer   . LUNG SURGERY   04/05/2020   Social History   Social History Narrative  . Not on file   Immunization History  Administered Date(s) Administered  . PFIZER SARS-COV-2 Vaccination 12/03/2019, 12/31/2019, 08/04/2020  . PPD Test 11/27/2015  . Tdap 07/22/2008     Objective: Vital Signs: BP 92/62 (BP Location: Right Arm, Patient Position: Sitting, Cuff Size: Small)   Pulse 71   Resp 16   Ht 5' 3.75" (1.619 m)   Wt 115 lb 12.8 oz (52.5 kg)   BMI 20.03 kg/m    Physical Exam Vitals and nursing note reviewed.  Constitutional:      Appearance: She is well-developed and well-nourished.  HENT:     Head: Normocephalic and atraumatic.  Eyes:     Extraocular Movements: EOM normal.     Conjunctiva/sclera: Conjunctivae normal.  Cardiovascular:     Rate and Rhythm: Normal rate and regular rhythm.     Pulses: Intact distal pulses.     Heart sounds: Normal heart sounds.  Pulmonary:     Effort: Pulmonary effort is normal.     Breath sounds: Normal breath sounds.  Abdominal:     General: Bowel sounds are normal.     Palpations: Abdomen is soft.  Musculoskeletal:     Cervical back: Normal range of motion.  Lymphadenopathy:     Cervical: No cervical adenopathy.  Skin:    General: Skin is warm and dry.     Capillary Refill: Capillary refill takes 2 to 3 seconds. No nailbed capillary changes or sclerodactyly was noted. Neurological:     Mental Status: She is alert and oriented to person, place, and time.  Psychiatric:        Mood and Affect: Mood and affect normal.        Behavior: Behavior normal.      Musculoskeletal Exam: C-spine was in good range of motion.  She had no tenderness over thoracic or lumbar spine.  She had discomfort range of motion for lumbar spine.  Shoulder joints, elbow joints, wrist joints, MCPs PIPs and DIPs with good range of motion with no synovitis.  Hip joints, knee joints, ankles, MTPs and PIPs with good range of motion with no synovitis.  She had no muscular weakness or  tenderness on my examination.  CDAI Exam: CDAI Score: -- Patient Global: --; Provider Global: -- Swollen: --; Tender: -- Joint Exam 10/19/2020   No joint exam has been documented for this visit   There is currently no information documented on the homunculus. Go to the Rheumatology activity and complete the homunculus joint exam.  Investigation: No additional findings.  Imaging: No results found.  Recent Labs: Lab Results  Component Value Date   WBC 4.3 05/19/2020   HGB 12.8 05/19/2020   PLT 245.0 05/19/2020   NA 139 05/19/2020   K 4.2 05/19/2020   CL 102 05/19/2020   CO2 32 05/19/2020   GLUCOSE 87 05/19/2020   BUN 12 05/19/2020   CREATININE 0.74 05/19/2020   BILITOT 0.6 05/19/2020   ALKPHOS 72 05/19/2020  AST 16 05/19/2020   ALT 17 05/19/2020   PROT 6.8 05/19/2020   ALBUMIN 4.2 05/19/2020   CALCIUM 9.2 05/19/2020   GFRAA >60 02/04/2016   QFTBGOLDPLUS NEGATIVE 05/26/2019    October 02, 2020 ANA 1: 320 speckled, ENA (double-stranded DNA, Smith, RNP, Ro, La, SCL 70, Jo 1, centromere negative) IFE negative, SPEP negative, B6 normal, alpha-tocopherol normal, folate normal, B12 normal, vitamin D 66, RF negative, anti-CCP negative, 14 3 3  eta negative, TSH normal, TPO antibody positive,  May 19, 2020 CK 69  Speciality Comments: No specialty comments available.  Procedures:  No procedures performed Allergies: Sulfa antibiotics and Montelukast sodium   Assessment / Plan:     Visit Diagnoses: Positive ANA (antinuclear antibody) -there is no history of oral ulcers, nasal ulcers, sicca symptoms, malar rash, photosensitivity, inflammatory arthritis.  She has mild Raynaud's.  Her ANA is positive.  ENA is completely negative.  I believe the positive ANA most likely is related to antithyroid antibodies.  As she has mild Raynaud's symptoms I will obtain some additional antibodies today.  05/19/20: ANA 1:320NS, dsDNA 1, CK 69, TSH 3.77, Vitamin B12>1526, Ro-, La-, SM/RNP-, SM  ab-, Scl-70- - Plan: Beta-2 glycoprotein antibodies, Cardiolipin antibodies, IgG, IgM, IgA, Lupus Anticoagulant Eval w/Reflex  Raynaud's disease without gangrene-her hands were cold to touch and she has history of mild Raynaud's symptoms.  She decreased capillary refill but no sclerodactyly or nailbed capillary changes were noted.  Myalgia -she gives history of myalgias in her arms and her lower extremities.  There is no muscular weakness.  Her CK was normal in the past.  I will obtain additional labs today.  Plan: CK, Sedimentation rate, Serum protein electrophoresis with reflex  Chronic pain of both knees -she complains of some discomfort in her knee joints.  Plan: XR KNEE 3 VIEW RIGHT, XR KNEE 3 VIEW LEFT bilateral moderate osteoarthritis and moderate to severe chondromalacia patella was noted.  Handout on knee joint exercises was given.  Acute midline low back pain without sciatica -she has been having severe lower back pain for the last 1 week.  MRI results are pending at this point.  She had no point tenderness on the examination.  Plan: XR Lumbar Spine 2-3 Views mild spondylosis and facet joint arthropathy was noted.  A handout on back exercises was given.  Adenocarcinoma of lung, stage 1, right ([place]) - 05/2019, wedge resection  S/P partial lobectomy of lung  Acquired hypothyroidism  Nontoxic multinodular goiter  Osteopenia, unspecified location-patient states she was diagnosed with osteopenia based on the DEXA scan.  Vitamin D deficiency-she takes vitamin D.  Mixed hyperlipidemia  Sensorineural hearing loss (SNHL) of both ears  Xanthoma of eyelid  Interstitial cystitis (chronic) without hematuria  Cyst of kidney, acquired  Bilateral fibrocystic breast disease (FCBD)  B12 deficiency  Orders: Orders Placed This Encounter  Procedures  . XR Lumbar Spine 2-3 Views  . XR KNEE 3 VIEW RIGHT  . XR KNEE 3 VIEW LEFT  . CK  . Sedimentation rate  . Serum protein electrophoresis  with reflex  . Beta-2 glycoprotein antibodies  . Cardiolipin antibodies, IgG, IgM, IgA  . Lupus Anticoagulant Eval w/Reflex   No orders of the defined types were placed in this encounter.     Follow-Up Instructions: Return for Myalgia.   Bo Merino, MD  Note - This record has been created using Editor, commissioning.  Chart creation errors have been sought, but may not always  have been located. Such creation errors  do not reflect on  the standard of medical care.

## 2020-10-15 ENCOUNTER — Other Ambulatory Visit: Payer: Self-pay | Admitting: Family Medicine

## 2020-10-15 DIAGNOSIS — J302 Other seasonal allergic rhinitis: Secondary | ICD-10-CM

## 2020-10-19 ENCOUNTER — Ambulatory Visit (INDEPENDENT_AMBULATORY_CARE_PROVIDER_SITE_OTHER): Payer: Medicare Other

## 2020-10-19 ENCOUNTER — Encounter: Payer: Self-pay | Admitting: Rheumatology

## 2020-10-19 ENCOUNTER — Ambulatory Visit (INDEPENDENT_AMBULATORY_CARE_PROVIDER_SITE_OTHER): Payer: Medicare Other | Admitting: Rheumatology

## 2020-10-19 ENCOUNTER — Ambulatory Visit: Payer: Self-pay

## 2020-10-19 ENCOUNTER — Other Ambulatory Visit: Payer: Self-pay

## 2020-10-19 VITALS — BP 92/62 | HR 71 | Resp 16 | Ht 63.75 in | Wt 115.8 lb

## 2020-10-19 DIAGNOSIS — R252 Cramp and spasm: Secondary | ICD-10-CM

## 2020-10-19 DIAGNOSIS — H0260 Xanthelasma of unspecified eye, unspecified eyelid: Secondary | ICD-10-CM

## 2020-10-19 DIAGNOSIS — E538 Deficiency of other specified B group vitamins: Secondary | ICD-10-CM

## 2020-10-19 DIAGNOSIS — M25562 Pain in left knee: Secondary | ICD-10-CM | POA: Diagnosis not present

## 2020-10-19 DIAGNOSIS — Z902 Acquired absence of lung [part of]: Secondary | ICD-10-CM

## 2020-10-19 DIAGNOSIS — M25561 Pain in right knee: Secondary | ICD-10-CM | POA: Diagnosis not present

## 2020-10-19 DIAGNOSIS — G8929 Other chronic pain: Secondary | ICD-10-CM

## 2020-10-19 DIAGNOSIS — C3491 Malignant neoplasm of unspecified part of right bronchus or lung: Secondary | ICD-10-CM

## 2020-10-19 DIAGNOSIS — R768 Other specified abnormal immunological findings in serum: Secondary | ICD-10-CM | POA: Diagnosis not present

## 2020-10-19 DIAGNOSIS — H903 Sensorineural hearing loss, bilateral: Secondary | ICD-10-CM

## 2020-10-19 DIAGNOSIS — M791 Myalgia, unspecified site: Secondary | ICD-10-CM | POA: Diagnosis not present

## 2020-10-19 DIAGNOSIS — M545 Low back pain, unspecified: Secondary | ICD-10-CM

## 2020-10-19 DIAGNOSIS — E039 Hypothyroidism, unspecified: Secondary | ICD-10-CM

## 2020-10-19 DIAGNOSIS — I7300 Raynaud's syndrome without gangrene: Secondary | ICD-10-CM

## 2020-10-19 DIAGNOSIS — N6012 Diffuse cystic mastopathy of left breast: Secondary | ICD-10-CM

## 2020-10-19 DIAGNOSIS — N281 Cyst of kidney, acquired: Secondary | ICD-10-CM

## 2020-10-19 DIAGNOSIS — I6389 Other cerebral infarction: Secondary | ICD-10-CM

## 2020-10-19 DIAGNOSIS — E042 Nontoxic multinodular goiter: Secondary | ICD-10-CM

## 2020-10-19 DIAGNOSIS — M8580 Other specified disorders of bone density and structure, unspecified site: Secondary | ICD-10-CM

## 2020-10-19 DIAGNOSIS — N3010 Interstitial cystitis (chronic) without hematuria: Secondary | ICD-10-CM

## 2020-10-19 DIAGNOSIS — E559 Vitamin D deficiency, unspecified: Secondary | ICD-10-CM

## 2020-10-19 DIAGNOSIS — E782 Mixed hyperlipidemia: Secondary | ICD-10-CM

## 2020-10-19 DIAGNOSIS — N6011 Diffuse cystic mastopathy of right breast: Secondary | ICD-10-CM

## 2020-10-19 NOTE — Patient Instructions (Signed)
Journal for Nurse Practitioners, 15(4), 263-267. Retrieved August 10, 2018 from [email or web address]">  °Knee Exercises °Ask your health care provider which exercises are safe for you. Do exercises exactly as told by your health care provider and adjust them as directed. It is normal to feel mild stretching, pulling, tightness, or discomfort as you do these exercises. Stop right away if you feel sudden pain or your pain gets worse. Do not begin these exercises until told by your health care provider. °Stretching and range-of-motion exercises °These exercises warm up your muscles and joints and improve the movement and flexibility of your knee. These exercises also help to relieve pain and swelling. °Knee extension, prone °1. Lie on your abdomen (prone position) on a bed. °2. Place your left / right knee just beyond the edge of the surface so your knee is not on the bed. You can put a towel under your left / right thigh just above your kneecap for comfort. °3. Relax your leg muscles and allow gravity to straighten your knee (extension). You should feel a stretch behind your left / right knee. °4. Hold this position for __________ seconds. °5. Scoot up so your knee is supported between repetitions. °Repeat __________ times. Complete this exercise __________ times a day. °Knee flexion, active ° °1. Lie on your back with both legs straight. If this causes back discomfort, bend your left / right knee so your foot is flat on the floor. °2. Slowly slide your left / right heel back toward your buttocks. Stop when you feel a gentle stretch in the front of your knee or thigh (flexion). °3. Hold this position for __________ seconds. °4. Slowly slide your left / right heel back to the starting position. °Repeat __________ times. Complete this exercise __________ times a day. °Quadriceps stretch, prone ° °1. Lie on your abdomen on a firm surface, such as a bed or padded floor. °2. Bend your left / right knee and hold  your ankle. If you cannot reach your ankle or pant leg, loop a belt around your foot and grab the belt instead. °3. Gently pull your heel toward your buttocks. Your knee should not slide out to the side. You should feel a stretch in the front of your thigh and knee (quadriceps). °4. Hold this position for __________ seconds. °Repeat __________ times. Complete this exercise __________ times a day. °Hamstring, supine °1. Lie on your back (supine position). °2. Loop a belt or towel over the ball of your left / right foot. The ball of your foot is on the walking surface, right under your toes. °3. Straighten your left / right knee and slowly pull on the belt to raise your leg until you feel a gentle stretch behind your knee (hamstring). °? Do not let your knee bend while you do this. °? Keep your other leg flat on the floor. °4. Hold this position for __________ seconds. °Repeat __________ times. Complete this exercise __________ times a day. °Strengthening exercises °These exercises build strength and endurance in your knee. Endurance is the ability to use your muscles for a long time, even after they get tired. °Quadriceps, isometric °This exercise stretches the muscles in front of your thigh (quadriceps) without moving your knee joint (isometric). °1. Lie on your back with your left / right leg extended and your other knee bent. Put a rolled towel or small pillow under your knee if told by your health care provider. °2. Slowly tense the muscles in the front of your left /   right thigh. You should see your kneecap slide up toward your hip or see increased dimpling just above the knee. This motion will push the back of the knee toward the floor. °3. For __________ seconds, hold the muscle as tight as you can without increasing your pain. °4. Relax the muscles slowly and completely. °Repeat __________ times. Complete this exercise __________ times a day. °Straight leg raises °This exercise stretches the muscles in front  of your thigh (quadriceps) and the muscles that move your hips (hip flexors). °1. Lie on your back with your left / right leg extended and your other knee bent. °2. Tense the muscles in the front of your left / right thigh. You should see your kneecap slide up or see increased dimpling just above the knee. Your thigh may even shake a bit. °3. Keep these muscles tight as you raise your leg 4-6 inches (10-15 cm) off the floor. Do not let your knee bend. °4. Hold this position for __________ seconds. °5. Keep these muscles tense as you lower your leg. °6. Relax your muscles slowly and completely after each repetition. °Repeat __________ times. Complete this exercise __________ times a day. °Hamstring, isometric °1. Lie on your back on a firm surface. °2. Bend your left / right knee about __________ degrees. °3. Dig your left / right heel into the surface as if you are trying to pull it toward your buttocks. Tighten the muscles in the back of your thighs (hamstring) to "dig" as hard as you can without increasing any pain. °4. Hold this position for __________ seconds. °5. Release the tension gradually and allow your muscles to relax completely for __________ seconds after each repetition. °Repeat __________ times. Complete this exercise __________ times a day. °Hamstring curls °If told by your health care provider, do this exercise while wearing ankle weights. Begin with __________ lb weights. Then increase the weight by 1 lb (0.5 kg) increments. Do not wear ankle weights that are more than __________ lb. °1. Lie on your abdomen with your legs straight. °2. Bend your left / right knee as far as you can without feeling pain. Keep your hips flat against the floor. °3. Hold this position for __________ seconds. °4. Slowly lower your leg to the starting position. °Repeat __________ times. Complete this exercise __________ times a day. °Squats °This exercise strengthens the muscles in front of your thigh and knee  (quadriceps). °1. Stand in front of a table, with your feet and knees pointing straight ahead. You may rest your hands on the table for balance but not for support. °2. Slowly bend your knees and lower your hips like you are going to sit in a chair. °? Keep your weight over your heels, not over your toes. °? Keep your lower legs upright so they are parallel with the table legs. °? Do not let your hips go lower than your knees. °? Do not bend lower than told by your health care provider. °? If your knee pain increases, do not bend as low. °3. Hold the squat position for __________ seconds. °4. Slowly push with your legs to return to standing. Do not use your hands to pull yourself to standing. °Repeat __________ times. Complete this exercise __________ times a day. °Wall slides °This exercise strengthens the muscles in front of your thigh and knee (quadriceps). °1. Lean your back against a smooth wall or door, and walk your feet out 18-24 inches (46-61 cm) from it. °2. Place your feet hip-width apart. °3.   Slowly slide down the wall or door until your knees bend __________ degrees. Keep your knees over your heels, not over your toes. Keep your knees in line with your hips. °4. Hold this position for __________ seconds. °Repeat __________ times. Complete this exercise __________ times a day. °Straight leg raises °This exercise strengthens the muscles that rotate the leg at the hip and move it away from your body (hip abductors). °1. Lie on your side with your left / right leg in the top position. Lie so your head, shoulder, knee, and hip line up. You may bend your bottom knee to help you keep your balance. °2. Roll your hips slightly forward so your hips are stacked directly over each other and your left / right knee is facing forward. °3. Leading with your heel, lift your top leg 4-6 inches (10-15 cm). You should feel the muscles in your outer hip lifting. °? Do not let your foot drift forward. °? Do not let your knee  roll toward the ceiling. °4. Hold this position for __________ seconds. °5. Slowly return your leg to the starting position. °6. Let your muscles relax completely after each repetition. °Repeat __________ times. Complete this exercise __________ times a day. °Straight leg raises °This exercise stretches the muscles that move your hips away from the front of the pelvis (hip extensors). °1. Lie on your abdomen on a firm surface. You can put a pillow under your hips if that is more comfortable. °2. Tense the muscles in your buttocks and lift your left / right leg about 4-6 inches (10-15 cm). Keep your knee straight as you lift your leg. °3. Hold this position for __________ seconds. °4. Slowly lower your leg to the starting position. °5. Let your leg relax completely after each repetition. °Repeat __________ times. Complete this exercise __________ times a day. °This information is not intended to replace advice given to you by your health care provider. Make sure you discuss any questions you have with your health care provider. °Document Revised: 08/11/2018 Document Reviewed: 08/11/2018 °[employer] Patient Education © 2020 [employer] ° °Back Exercises °The following exercises strengthen the muscles that help to support the trunk and back. They also help to keep the lower back flexible. Doing these exercises can help to prevent back pain or lessen existing pain. °· If you have back pain or discomfort, try doing these exercises 2-3 times each day or as told by your health care provider. °· As your pain improves, do them once each day, but increase the number of times that you repeat the steps for each exercise (do more repetitions). °· To prevent the recurrence of back pain, continue to do these exercises once each day or as told by your health care provider. °Do exercises exactly as told by your health care provider and adjust them as directed. It is normal to feel mild stretching, pulling, tightness, or discomfort  as you do these exercises, but you should stop right away if you feel sudden pain or your pain gets worse. °Exercises °Single knee to chest °Repeat these steps 3-5 times for each leg: °1. Lie on your back on a firm bed or the floor with your legs extended. °2. Bring one knee to your chest. Your other leg should stay extended and in contact with the floor. °3. Hold your knee in place by grabbing your knee or thigh with both hands and hold. °4. Pull on your knee until you feel a gentle stretch in your lower back   or buttocks. °5. Hold the stretch for 10-30 seconds. °6. Slowly release and straighten your leg. °Pelvic tilt °Repeat these steps 5-10 times: °1. Lie on your back on a firm bed or the floor with your legs extended. °2. Bend your knees so they are pointing toward the ceiling and your feet are flat on the floor. °3. Tighten your lower abdominal muscles to press your lower back against the floor. This motion will tilt your pelvis so your tailbone points up toward the ceiling instead of pointing to your feet or the floor. °4. With gentle tension and even breathing, hold this position for 5-10 seconds. °Cat-cow °Repeat these steps until your lower back becomes more flexible: °1. Get into a hands-and-knees position on a firm surface. Keep your hands under your shoulders, and keep your knees under your hips. You may place padding under your knees for comfort. °2. Let your head hang down toward your chest. Contract your abdominal muscles and point your tailbone toward the floor so your lower back becomes rounded like the back of a cat. °3. Hold this position for 5 seconds. °4. Slowly lift your head, let your abdominal muscles relax and point your tailbone up toward the ceiling so your back forms a sagging arch like the back of a cow. °5. Hold this position for 5 seconds. ° °Press-ups °Repeat these steps 5-10 times: °1. Lie on your abdomen (face-down) on the floor. °2. Place your palms near your head, about  shoulder-width apart. °3. Keeping your back as relaxed as possible and keeping your hips on the floor, slowly straighten your arms to raise the top half of your body and lift your shoulders. Do not use your back muscles to raise your upper torso. You may adjust the placement of your hands to make yourself more comfortable. °4. Hold this position for 5 seconds while you keep your back relaxed. °5. Slowly return to lying flat on the floor. ° °Bridges °Repeat these steps 10 times: °1. Lie on your back on a firm surface. °2. Bend your knees so they are pointing toward the ceiling and your feet are flat on the floor. Your arms should be flat at your sides, next to your body. °3. Tighten your buttocks muscles and lift your buttocks off the floor until your waist is at almost the same height as your knees. You should feel the muscles working in your buttocks and the back of your thighs. If you do not feel these muscles, slide your feet 1-2 inches farther away from your buttocks. °4. Hold this position for 3-5 seconds. °5. Slowly lower your hips to the starting position, and allow your buttocks muscles to relax completely. °If this exercise is too easy, try doing it with your arms crossed over your chest. °Abdominal crunches °Repeat these steps 5-10 times: °1. Lie on your back on a firm bed or the floor with your legs extended. °2. Bend your knees so they are pointing toward the ceiling and your feet are flat on the floor. °3. Cross your arms over your chest. °4. Tip your chin slightly toward your chest without bending your neck. °5. Tighten your abdominal muscles and slowly raise your trunk (torso) high enough to lift your shoulder blades a tiny bit off the floor. Avoid raising your torso higher than that because it can put too much stress on your low back and does not help to strengthen your abdominal muscles. °6. Slowly return to your starting position. °Back lifts °Repeat these steps 5-10   times: °1. Lie on your abdomen  (face-down) with your arms at your sides, and rest your forehead on the floor. °2. Tighten the muscles in your legs and your buttocks. °3. Slowly lift your chest off the floor while you keep your hips pressed to the floor. Keep the back of your head in line with the curve in your back. Your eyes should be looking at the floor. °4. Hold this position for 3-5 seconds. °5. Slowly return to your starting position. °Contact a health care provider if: °· Your back pain or discomfort gets much worse when you do an exercise. °· Your worsening back pain or discomfort does not lessen within 2 hours after you exercise. °If you have any of these problems, stop doing these exercises right away. Do not do them again unless your health care provider says that you can. °Get help right away if: °· You develop sudden, severe back pain. If this happens, stop doing the exercises right away. Do not do them again unless your health care provider says that you can. °This information is not intended to replace advice given to you by your health care provider. Make sure you discuss any questions you have with your health care provider. °Document Revised: 02/25/2019 Document Reviewed: 07/23/2018 °[employer] Patient Education © 2020 [employer] ° °

## 2020-10-20 ENCOUNTER — Encounter: Payer: Self-pay | Admitting: Rheumatology

## 2020-10-20 ENCOUNTER — Telehealth: Payer: Self-pay

## 2020-10-20 NOTE — Telephone Encounter (Signed)
Patient called stating she was able to attach her MRI results through [facility].  Patient is requesting a return call this afternoon if possible to discuss the results.

## 2020-10-20 NOTE — Telephone Encounter (Signed)
I called the patient to review MRI results.  Dr. Estanislado Pandy personally reviewed the MRI and asked me to call the patient to further discuss. Dr. Estanislado Pandy recommended following up with order physician (Dr. Karren Burly the patients neurologist) or to see a spine specialist.  Patient declined referral to spinal specialist at this time.  She plans on following up with her PCP for further recommendations.

## 2020-10-23 LAB — LUPUS ANTICOAGULANT EVAL W/ REFLEX
PTT-LA Screen: 25 s (ref <=40)
dRVVT: 31 s (ref <=45)

## 2020-10-23 LAB — PROTEIN ELECTROPHORESIS, SERUM, WITH REFLEX
Albumin ELP: 4.1 g/dL (ref 3.8–4.8)
Alpha 1: 0.3 g/dL (ref 0.2–0.3)
Alpha 2: 0.7 g/dL (ref 0.5–0.9)
Beta 2: 0.4 g/dL (ref 0.2–0.5)
Beta Globulin: 0.5 g/dL (ref 0.4–0.6)
Gamma Globulin: 1.1 g/dL (ref 0.8–1.7)
Total Protein: 7.1 g/dL (ref 6.1–8.1)

## 2020-10-23 LAB — BETA-2 GLYCOPROTEIN ANTIBODIES
Beta-2 Glyco 1 IgA: <2 U/mL
Beta-2 Glyco 1 IgM: 2.5 U/mL
Beta-2 Glyco I IgG: <2 U/mL

## 2020-10-23 LAB — CARDIOLIPIN ANTIBODIES, IGG, IGM, IGA
Anticardiolipin IgA: <2 APL-U/mL
Anticardiolipin IgG: <2 GPL-U/mL
Anticardiolipin IgM: 3 MPL-U/mL

## 2020-10-23 LAB — SEDIMENTATION RATE: Sed Rate: 9 mm/h (ref 0–30)

## 2020-10-23 LAB — CK: Total CK: 49 U/L (ref 29–143)

## 2020-10-23 NOTE — Progress Notes (Signed)
Will discuss results at the follow-up visit.

## 2020-11-04 NOTE — Progress Notes (Signed)
Office Visit Note  Patient: Jocelyn Parker             Date of Birth: Mar 18, 1951           MRN: [id number]             PCP: Orma Flaming, MD Referring: Orma Flaming, MD Visit Date: 11/09/2020 Occupation: @GUAROCC @  Subjective:  Lower back pain, positive ANA.   History of Present Illness: Jocelyn Parker is a 70 y.o. female returns for follow-up visit today for the evaluation of positive ANA.  She denies any history of oral ulcers, nasal ulcers, malar rash, photosensitivity, sicca symptoms, Raynaud's phenomenon, inflammatory arthritis or lymphadenopathy.  She states she continues to have some discomfort in her knee joints especially with climbing stairs.  She also has lower back pain.  She has been going to a chiropractor which has been helpful.  The discomfort in her bilateral thigh muscles has improved.  Activities of Daily Living:  Patient reports morning stiffness for 0 minutes.   Patient Reports nocturnal pain.  Difficulty dressing/grooming: Denies Difficulty climbing stairs: Denies Difficulty getting out of chair: Denies Difficulty using hands for taps, buttons, cutlery, and/or writing: Denies  Review of Systems  Constitutional: Negative for fatigue.  HENT: Negative for mouth sores, mouth dryness and nose dryness.   Eyes: Positive for dryness. Negative for pain and itching.  Respiratory: Negative for shortness of breath and difficulty breathing.   Cardiovascular: Negative for chest pain and palpitations.  Gastrointestinal: Positive for constipation. Negative for blood in stool and diarrhea.  Endocrine: Negative for increased urination.  Genitourinary: Negative for difficulty urinating.  Musculoskeletal: Positive for myalgias, muscle tenderness and myalgias. Negative for arthralgias, joint pain, joint swelling and morning stiffness.  Skin: Negative for color change, rash and redness.  Allergic/Immunologic: Negative for susceptible to infections.  Neurological: Negative for  dizziness, numbness, headaches, memory loss and weakness.  Hematological: Positive for bruising/bleeding tendency.  Psychiatric/Behavioral: Negative for confusion.    PMFS History:  Patient Active Problem List   Diagnosis Date Noted  . S/P partial lobectomy of lung 04/12/2020  . Nodule of upper lobe of right lung 04/05/2020  . Bilateral breast cysts 12/27/2019  . Malignant neoplasm of upper lobe of right lung ([facility]) 10/12/2019  . Sensorineural hearing loss (SNHL) of both ears 06/20/2019  . Adenocarcinoma of lung, stage 1, right ([place]) 05/26/2019  . Tinnitus of both ears 05/12/2019  . B12 deficiency 04/11/2019  . Cyst of ovary 04/01/2019  . Osteopenia 04/01/2019  . Vitamin D deficiency 05/24/2015  . 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 Cyst of kidney, acquired 12/20/2014  . Interstitial cystitis (chronic) without hematuria 03/20/2012  . Bilateral fibrocystic breast disease (FCBD) 03/20/2012  . Hypothyroidism, followed by Dr. [name] 08/16/2008  . Hyperlipidemia, LDL 194, HDL 62 08/16/2008  . Shortness of breath 08/16/2008  . Nontoxic multinodular goiter 08/08/2007    Past Medical History:  Diagnosis Date  . Allergy    sulfa  . Chronic interstitial cystitis   . Lung cancer ([facility])    per patient   . Thyroid disease     Family History  Problem Relation Age of Onset  . Renal cancer Mother   . Heart attack Father   . Stroke Maternal Grandmother   . Heart attack Paternal Grandfather   . Multiple sclerosis Sister   . Cancer Brother    Past Surgical History:  Procedure Laterality Date  . ABDOMINAL HYSTERECTOMY    . lumps removed from breast  1970's ,1990's and in 2000's  nono cancer   . LUNG SURGERY  04/05/2020   Social History   Social History Narrative  . Not on file   Immunization History  Administered Date(s) Administered  . PFIZER SARS-COV-2 Vaccination 12/03/2019, 12/31/2019, 08/04/2020  . PPD Test 11/27/2015  . Tdap 07/22/2008     Objective: Vital Signs: BP  110/72 (BP Location: Left Arm, Patient Position: Sitting, Cuff Size: Normal)   Pulse 66   Resp 14   Ht 5' 3"  (1.6 m)   Wt 118 lb (53.5 kg)   BMI 20.90 kg/m    Physical Exam Vitals and nursing note reviewed.  Constitutional:      Appearance: She is well-developed and well-nourished.  HENT:     Head: Normocephalic and atraumatic.  Eyes:     Extraocular Movements: EOM normal.     Conjunctiva/sclera: Conjunctivae normal.  Cardiovascular:     Rate and Rhythm: Normal rate and regular rhythm.     Pulses: Intact distal pulses.     Heart sounds: Normal heart sounds.  Pulmonary:     Effort: Pulmonary effort is normal.     Breath sounds: Normal breath sounds.  Abdominal:     General: Bowel sounds are normal.     Palpations: Abdomen is soft.  Musculoskeletal:     Cervical back: Normal range of motion.  Lymphadenopathy:     Cervical: No cervical adenopathy.  Skin:    General: Skin is warm and dry.     Capillary Refill: Capillary refill takes less than 2 seconds.  Neurological:     Mental Status: She is alert and oriented to person, place, and time.  Psychiatric:        Mood and Affect: Mood and affect normal.        Behavior: Behavior normal.      Musculoskeletal Exam: C-spine was in good range of motion.  Lumbar spine was in good range of motion.  She had no point tenderness.  Shoulder joints, elbow joints, wrist joints with good range of motion.  She has no MCP PIP or DIP synovitis.  Hip joints, knee joints, ankles, MTPs and PIPs with good range of motion.  She has crepitus in her bilateral knee joints.  CDAI Exam: CDAI Score: -- Patient Global: --; Provider Global: -- Swollen: --; Tender: -- Joint Exam 11/09/2020   No joint exam has been documented for this visit   There is currently no information documented on the homunculus. Go to the Rheumatology activity and complete the homunculus joint exam.  Investigation: No additional findings.  Imaging: XR KNEE 3 VIEW  LEFT  Result Date: 10/19/2020 Moderate medial compartment narrowing and severe patellofemoral narrowing was noted.  No chondrocalcinosis was noted. Impression: These findings are consistent with moderate osteoarthritis and severe chondromalacia patella.  XR KNEE 3 VIEW RIGHT  Result Date: 10/19/2020 Moderate medial compartment narrowing with intercondylar osteophytes was noted.  Mild patellofemoral narrowing was noted.  No chondrocalcinosis was noted. Impression: These findings are consistent with moderate osteoarthritis and mild chondromalacia patella.  XR Lumbar Spine 2-3 Views  Result Date: 10/19/2020 Mild levoscoliosis was noted.  No significant disc space narrowing was noted.  Mild anterior spurring was noted.  Facet joint arthropathy was noted. Impression: These findings are consistent with mild spondylosis and facet joint arthropathy.   Recent Labs: Lab Results  Component Value Date   WBC 4.3 05/19/2020   HGB 12.8 05/19/2020   PLT 245.0 05/19/2020   NA 139 05/19/2020  K 4.2 05/19/2020   CL 102 05/19/2020   CO2 32 05/19/2020   GLUCOSE 87 05/19/2020   BUN 12 05/19/2020   CREATININE 0.74 05/19/2020   BILITOT 0.6 05/19/2020   ALKPHOS 72 05/19/2020   AST 16 05/19/2020   ALT 17 05/19/2020   PROT 7.1 10/19/2020   ALBUMIN 4.2 05/19/2020   CALCIUM 9.2 05/19/2020   GFRAA >60 02/04/2016   QFTBGOLDPLUS NEGATIVE 05/26/2019   10/19/2020 SPEP normal, lupus anticoagulant negative, beta-2 negative, anticardiolipin negative, CK 49, ESR 9  Speciality Comments: No specialty comments available.  Procedures:  No procedures performed Allergies: Sulfa antibiotics and Montelukast sodium   Assessment / Plan:     Visit Diagnoses: Positive ANA (antinuclear antibody) - ANA 1: 320NS, ENA negative, complements normal, no clinical features of autoimmune disease.  Positive TPO antibody.  Labs were discussed with the patient at length.  She has no clinical features of autoimmune disease.  She  denies any history of oral ulcers, nasal ulcers, malar rash, photosensitivity, inflammatory arthritis or lymphadenopathy.  Have advised her to contact me if she develops any new symptoms.  I believe the positive ANA could be related to Hashimoto's disease.  Raynaud's disease without gangrene - Mild symptoms with no nailbed capillary changes.  She had good capillary refill.  Symptoms are manageable.  There is no history of digital ulcers.  Primary osteoarthritis of both knees - Bilateral moderate osteoarthritis and mild chondromalacia patella.  X-rays obtained at the last visit were reviewed.  Knee joint muscle strengthening exercises were discussed.  A handout on exercises was given.  DDD (degenerative disc disease), lumbar - Mild levoscoliosis, mild spondylosis and facet joint arthropathy.  X-rays obtained at the last visit were reviewed.  She has been going to a chiropractor which has been helpful.  I demonstrated some of the exercises in the office and a handout was given.  Myalgia - CK is normal.  Lab values were discussed.  She has no muscular weakness or tenderness.  She had some discomfort in bilateral thigh muscles which is resolved.  Other medical problems are listed as follows:  Adenocarcinoma of lung, stage 1, right ([facility])  S/P partial lobectomy of lung  Osteopenia, unspecified location  Vitamin D deficiency  Acquired hypothyroidism  Nontoxic multinodular goiter  Mixed hyperlipidemia  Xanthoma of eyelid  Sensorineural hearing loss (SNHL) of both ears  Interstitial cystitis (chronic) without hematuria  Cyst of kidney, acquired  Bilateral fibrocystic breast disease (FCBD)  B12 deficiency  Orders: No orders of the defined types were placed in this encounter.  No orders of the defined types were placed in this encounter.   Follow-Up Instructions: Return in about 1 year (around 11/09/2021), or if symptoms worsen or fail to improve, for oA, DDD.   Bo Merino,  MD  Note - This record has been created using Editor, commissioning.  Chart creation errors have been sought, but may not always  have been located. Such creation errors do not reflect on  the standard of medical care.

## 2020-11-09 ENCOUNTER — Ambulatory Visit (INDEPENDENT_AMBULATORY_CARE_PROVIDER_SITE_OTHER): Payer: Medicare Other | Admitting: Rheumatology

## 2020-11-09 ENCOUNTER — Encounter: Payer: Self-pay | Admitting: Rheumatology

## 2020-11-09 ENCOUNTER — Other Ambulatory Visit: Payer: Self-pay

## 2020-11-09 VITALS — BP 110/72 | HR 66 | Resp 14 | Ht 63.0 in | Wt 118.0 lb

## 2020-11-09 DIAGNOSIS — N6011 Diffuse cystic mastopathy of right breast: Secondary | ICD-10-CM

## 2020-11-09 DIAGNOSIS — H903 Sensorineural hearing loss, bilateral: Secondary | ICD-10-CM

## 2020-11-09 DIAGNOSIS — M5136 Other intervertebral disc degeneration, lumbar region: Secondary | ICD-10-CM

## 2020-11-09 DIAGNOSIS — Z902 Acquired absence of lung [part of]: Secondary | ICD-10-CM

## 2020-11-09 DIAGNOSIS — I7300 Raynaud's syndrome without gangrene: Secondary | ICD-10-CM

## 2020-11-09 DIAGNOSIS — E538 Deficiency of other specified B group vitamins: Secondary | ICD-10-CM

## 2020-11-09 DIAGNOSIS — C3491 Malignant neoplasm of unspecified part of right bronchus or lung: Secondary | ICD-10-CM

## 2020-11-09 DIAGNOSIS — N6012 Diffuse cystic mastopathy of left breast: Secondary | ICD-10-CM

## 2020-11-09 DIAGNOSIS — N281 Cyst of kidney, acquired: Secondary | ICD-10-CM

## 2020-11-09 DIAGNOSIS — R768 Other specified abnormal immunological findings in serum: Secondary | ICD-10-CM

## 2020-11-09 DIAGNOSIS — E039 Hypothyroidism, unspecified: Secondary | ICD-10-CM

## 2020-11-09 DIAGNOSIS — M170 Bilateral primary osteoarthritis of knee: Secondary | ICD-10-CM | POA: Diagnosis not present

## 2020-11-09 DIAGNOSIS — N3010 Interstitial cystitis (chronic) without hematuria: Secondary | ICD-10-CM

## 2020-11-09 DIAGNOSIS — E782 Mixed hyperlipidemia: Secondary | ICD-10-CM

## 2020-11-09 DIAGNOSIS — E042 Nontoxic multinodular goiter: Secondary | ICD-10-CM

## 2020-11-09 DIAGNOSIS — M8580 Other specified disorders of bone density and structure, unspecified site: Secondary | ICD-10-CM

## 2020-11-09 DIAGNOSIS — E559 Vitamin D deficiency, unspecified: Secondary | ICD-10-CM

## 2020-11-09 DIAGNOSIS — M51369 Other intervertebral disc degeneration, lumbar region without mention of lumbar back pain or lower extremity pain: Secondary | ICD-10-CM

## 2020-11-09 DIAGNOSIS — H0260 Xanthelasma of unspecified eye, unspecified eyelid: Secondary | ICD-10-CM

## 2020-11-09 DIAGNOSIS — M791 Myalgia, unspecified site: Secondary | ICD-10-CM

## 2020-11-09 NOTE — Patient Instructions (Signed)
Journal for Nurse Practitioners, 15(4), 263-267. Retrieved August 10, 2018 from [email or web address]">  °Knee Exercises °Ask your health care provider which exercises are safe for you. Do exercises exactly as told by your health care provider and adjust them as directed. It is normal to feel mild stretching, pulling, tightness, or discomfort as you do these exercises. Stop right away if you feel sudden pain or your pain gets worse. Do not begin these exercises until told by your health care provider. °Stretching and range-of-motion exercises °These exercises warm up your muscles and joints and improve the movement and flexibility of your knee. These exercises also help to relieve pain and swelling. °Knee extension, prone °1. Lie on your abdomen (prone position) on a bed. °2. Place your left / right knee just beyond the edge of the surface so your knee is not on the bed. You can put a towel under your left / right thigh just above your kneecap for comfort. °3. Relax your leg muscles and allow gravity to straighten your knee (extension). You should feel a stretch behind your left / right knee. °4. Hold this position for __________ seconds. °5. Scoot up so your knee is supported between repetitions. °Repeat __________ times. Complete this exercise __________ times a day. °Knee flexion, active ° °1. Lie on your back with both legs straight. If this causes back discomfort, bend your left / right knee so your foot is flat on the floor. °2. Slowly slide your left / right heel back toward your buttocks. Stop when you feel a gentle stretch in the front of your knee or thigh (flexion). °3. Hold this position for __________ seconds. °4. Slowly slide your left / right heel back to the starting position. °Repeat __________ times. Complete this exercise __________ times a day. °Quadriceps stretch, prone ° °1. Lie on your abdomen on a firm surface, such as a bed or padded floor. °2. Bend your left / right knee and hold  your ankle. If you cannot reach your ankle or pant leg, loop a belt around your foot and grab the belt instead. °3. Gently pull your heel toward your buttocks. Your knee should not slide out to the side. You should feel a stretch in the front of your thigh and knee (quadriceps). °4. Hold this position for __________ seconds. °Repeat __________ times. Complete this exercise __________ times a day. °Hamstring, supine °1. Lie on your back (supine position). °2. Loop a belt or towel over the ball of your left / right foot. The ball of your foot is on the walking surface, right under your toes. °3. Straighten your left / right knee and slowly pull on the belt to raise your leg until you feel a gentle stretch behind your knee (hamstring). °? Do not let your knee bend while you do this. °? Keep your other leg flat on the floor. °4. Hold this position for __________ seconds. °Repeat __________ times. Complete this exercise __________ times a day. °Strengthening exercises °These exercises build strength and endurance in your knee. Endurance is the ability to use your muscles for a long time, even after they get tired. °Quadriceps, isometric °This exercise stretches the muscles in front of your thigh (quadriceps) without moving your knee joint (isometric). °1. Lie on your back with your left / right leg extended and your other knee bent. Put a rolled towel or small pillow under your knee if told by your health care provider. °2. Slowly tense the muscles in the front of your left /   right thigh. You should see your kneecap slide up toward your hip or see increased dimpling just above the knee. This motion will push the back of the knee toward the floor. °3. For __________ seconds, hold the muscle as tight as you can without increasing your pain. °4. Relax the muscles slowly and completely. °Repeat __________ times. Complete this exercise __________ times a day. °Straight leg raises °This exercise stretches the muscles in front  of your thigh (quadriceps) and the muscles that move your hips (hip flexors). °1. Lie on your back with your left / right leg extended and your other knee bent. °2. Tense the muscles in the front of your left / right thigh. You should see your kneecap slide up or see increased dimpling just above the knee. Your thigh may even shake a bit. °3. Keep these muscles tight as you raise your leg 4-6 inches (10-15 cm) off the floor. Do not let your knee bend. °4. Hold this position for __________ seconds. °5. Keep these muscles tense as you lower your leg. °6. Relax your muscles slowly and completely after each repetition. °Repeat __________ times. Complete this exercise __________ times a day. °Hamstring, isometric °1. Lie on your back on a firm surface. °2. Bend your left / right knee about __________ degrees. °3. Dig your left / right heel into the surface as if you are trying to pull it toward your buttocks. Tighten the muscles in the back of your thighs (hamstring) to "dig" as hard as you can without increasing any pain. °4. Hold this position for __________ seconds. °5. Release the tension gradually and allow your muscles to relax completely for __________ seconds after each repetition. °Repeat __________ times. Complete this exercise __________ times a day. °Hamstring curls °If told by your health care provider, do this exercise while wearing ankle weights. Begin with __________ lb weights. Then increase the weight by 1 lb (0.5 kg) increments. Do not wear ankle weights that are more than __________ lb. °1. Lie on your abdomen with your legs straight. °2. Bend your left / right knee as far as you can without feeling pain. Keep your hips flat against the floor. °3. Hold this position for __________ seconds. °4. Slowly lower your leg to the starting position. °Repeat __________ times. Complete this exercise __________ times a day. °Squats °This exercise strengthens the muscles in front of your thigh and knee  (quadriceps). °1. Stand in front of a table, with your feet and knees pointing straight ahead. You may rest your hands on the table for balance but not for support. °2. Slowly bend your knees and lower your hips like you are going to sit in a chair. °? Keep your weight over your heels, not over your toes. °? Keep your lower legs upright so they are parallel with the table legs. °? Do not let your hips go lower than your knees. °? Do not bend lower than told by your health care provider. °? If your knee pain increases, do not bend as low. °3. Hold the squat position for __________ seconds. °4. Slowly push with your legs to return to standing. Do not use your hands to pull yourself to standing. °Repeat __________ times. Complete this exercise __________ times a day. °Wall slides °This exercise strengthens the muscles in front of your thigh and knee (quadriceps). °1. Lean your back against a smooth wall or door, and walk your feet out 18-24 inches (46-61 cm) from it. °2. Place your feet hip-width apart. °3.   Slowly slide down the wall or door until your knees bend __________ degrees. Keep your knees over your heels, not over your toes. Keep your knees in line with your hips. °4. Hold this position for __________ seconds. °Repeat __________ times. Complete this exercise __________ times a day. °Straight leg raises °This exercise strengthens the muscles that rotate the leg at the hip and move it away from your body (hip abductors). °1. Lie on your side with your left / right leg in the top position. Lie so your head, shoulder, knee, and hip line up. You may bend your bottom knee to help you keep your balance. °2. Roll your hips slightly forward so your hips are stacked directly over each other and your left / right knee is facing forward. °3. Leading with your heel, lift your top leg 4-6 inches (10-15 cm). You should feel the muscles in your outer hip lifting. °? Do not let your foot drift forward. °? Do not let your knee  roll toward the ceiling. °4. Hold this position for __________ seconds. °5. Slowly return your leg to the starting position. °6. Let your muscles relax completely after each repetition. °Repeat __________ times. Complete this exercise __________ times a day. °Straight leg raises °This exercise stretches the muscles that move your hips away from the front of the pelvis (hip extensors). °1. Lie on your abdomen on a firm surface. You can put a pillow under your hips if that is more comfortable. °2. Tense the muscles in your buttocks and lift your left / right leg about 4-6 inches (10-15 cm). Keep your knee straight as you lift your leg. °3. Hold this position for __________ seconds. °4. Slowly lower your leg to the starting position. °5. Let your leg relax completely after each repetition. °Repeat __________ times. Complete this exercise __________ times a day. °This information is not intended to replace advice given to you by your health care provider. Make sure you discuss any questions you have with your health care provider. °Document Revised: 08/11/2018 Document Reviewed: 08/11/2018 °[employer] Patient Education © 2020 [employer] ° °Back Exercises °The following exercises strengthen the muscles that help to support the trunk and back. They also help to keep the lower back flexible. Doing these exercises can help to prevent back pain or lessen existing pain. °· If you have back pain or discomfort, try doing these exercises 2-3 times each day or as told by your health care provider. °· As your pain improves, do them once each day, but increase the number of times that you repeat the steps for each exercise (do more repetitions). °· To prevent the recurrence of back pain, continue to do these exercises once each day or as told by your health care provider. °Do exercises exactly as told by your health care provider and adjust them as directed. It is normal to feel mild stretching, pulling, tightness, or discomfort  as you do these exercises, but you should stop right away if you feel sudden pain or your pain gets worse. °Exercises °Single knee to chest °Repeat these steps 3-5 times for each leg: °1. Lie on your back on a firm bed or the floor with your legs extended. °2. Bring one knee to your chest. Your other leg should stay extended and in contact with the floor. °3. Hold your knee in place by grabbing your knee or thigh with both hands and hold. °4. Pull on your knee until you feel a gentle stretch in your lower back   or buttocks. °5. Hold the stretch for 10-30 seconds. °6. Slowly release and straighten your leg. °Pelvic tilt °Repeat these steps 5-10 times: °1. Lie on your back on a firm bed or the floor with your legs extended. °2. Bend your knees so they are pointing toward the ceiling and your feet are flat on the floor. °3. Tighten your lower abdominal muscles to press your lower back against the floor. This motion will tilt your pelvis so your tailbone points up toward the ceiling instead of pointing to your feet or the floor. °4. With gentle tension and even breathing, hold this position for 5-10 seconds. °Cat-cow °Repeat these steps until your lower back becomes more flexible: °1. Get into a hands-and-knees position on a firm surface. Keep your hands under your shoulders, and keep your knees under your hips. You may place padding under your knees for comfort. °2. Let your head hang down toward your chest. Contract your abdominal muscles and point your tailbone toward the floor so your lower back becomes rounded like the back of a cat. °3. Hold this position for 5 seconds. °4. Slowly lift your head, let your abdominal muscles relax and point your tailbone up toward the ceiling so your back forms a sagging arch like the back of a cow. °5. Hold this position for 5 seconds. ° °Press-ups °Repeat these steps 5-10 times: °1. Lie on your abdomen (face-down) on the floor. °2. Place your palms near your head, about  shoulder-width apart. °3. Keeping your back as relaxed as possible and keeping your hips on the floor, slowly straighten your arms to raise the top half of your body and lift your shoulders. Do not use your back muscles to raise your upper torso. You may adjust the placement of your hands to make yourself more comfortable. °4. Hold this position for 5 seconds while you keep your back relaxed. °5. Slowly return to lying flat on the floor. ° °Bridges °Repeat these steps 10 times: °1. Lie on your back on a firm surface. °2. Bend your knees so they are pointing toward the ceiling and your feet are flat on the floor. Your arms should be flat at your sides, next to your body. °3. Tighten your buttocks muscles and lift your buttocks off the floor until your waist is at almost the same height as your knees. You should feel the muscles working in your buttocks and the back of your thighs. If you do not feel these muscles, slide your feet 1-2 inches farther away from your buttocks. °4. Hold this position for 3-5 seconds. °5. Slowly lower your hips to the starting position, and allow your buttocks muscles to relax completely. °If this exercise is too easy, try doing it with your arms crossed over your chest. °Abdominal crunches °Repeat these steps 5-10 times: °1. Lie on your back on a firm bed or the floor with your legs extended. °2. Bend your knees so they are pointing toward the ceiling and your feet are flat on the floor. °3. Cross your arms over your chest. °4. Tip your chin slightly toward your chest without bending your neck. °5. Tighten your abdominal muscles and slowly raise your trunk (torso) high enough to lift your shoulder blades a tiny bit off the floor. Avoid raising your torso higher than that because it can put too much stress on your low back and does not help to strengthen your abdominal muscles. °6. Slowly return to your starting position. °Back lifts °Repeat these steps 5-10   times: °1. Lie on your abdomen  (face-down) with your arms at your sides, and rest your forehead on the floor. °2. Tighten the muscles in your legs and your buttocks. °3. Slowly lift your chest off the floor while you keep your hips pressed to the floor. Keep the back of your head in line with the curve in your back. Your eyes should be looking at the floor. °4. Hold this position for 3-5 seconds. °5. Slowly return to your starting position. °Contact a health care provider if: °· Your back pain or discomfort gets much worse when you do an exercise. °· Your worsening back pain or discomfort does not lessen within 2 hours after you exercise. °If you have any of these problems, stop doing these exercises right away. Do not do them again unless your health care provider says that you can. °Get help right away if: °· You develop sudden, severe back pain. If this happens, stop doing the exercises right away. Do not do them again unless your health care provider says that you can. °This information is not intended to replace advice given to you by your health care provider. Make sure you discuss any questions you have with your health care provider. °Document Revised: 02/25/2019 Document Reviewed: 07/23/2018 °[employer] Patient Education © 2020 [employer] ° °

## 2020-12-29 ENCOUNTER — Telehealth: Payer: Self-pay

## 2020-12-29 ENCOUNTER — Other Ambulatory Visit: Payer: Self-pay

## 2020-12-29 ENCOUNTER — Encounter: Payer: Self-pay | Admitting: Family Medicine

## 2020-12-29 DIAGNOSIS — E782 Mixed hyperlipidemia: Secondary | ICD-10-CM

## 2020-12-29 DIAGNOSIS — E538 Deficiency of other specified B group vitamins: Secondary | ICD-10-CM

## 2020-12-29 DIAGNOSIS — R5383 Other fatigue: Secondary | ICD-10-CM

## 2020-12-29 NOTE — Telephone Encounter (Signed)
FYI

## 2020-12-29 NOTE — Telephone Encounter (Signed)
Labs should be in, can schedule.  Thanks!  aw

## 2020-12-29 NOTE — Telephone Encounter (Signed)
Please advise 

## 2020-12-29 NOTE — Telephone Encounter (Signed)
Patient called in requesting an appointment to come in for a lab test for a metabolic panel and a cholesterol, did not see any orders. Okay to schedule for a lab appointment?

## 2020-12-29 NOTE — Telephone Encounter (Signed)
Patient is scheduled for Monday.

## 2021-01-01 ENCOUNTER — Other Ambulatory Visit (INDEPENDENT_AMBULATORY_CARE_PROVIDER_SITE_OTHER): Payer: Medicare Other

## 2021-01-01 ENCOUNTER — Other Ambulatory Visit: Payer: Self-pay

## 2021-01-01 DIAGNOSIS — E538 Deficiency of other specified B group vitamins: Secondary | ICD-10-CM

## 2021-01-01 DIAGNOSIS — R5383 Other fatigue: Secondary | ICD-10-CM | POA: Diagnosis not present

## 2021-01-01 DIAGNOSIS — E782 Mixed hyperlipidemia: Secondary | ICD-10-CM

## 2021-01-01 LAB — LIPID PANEL
Cholesterol: 213 mg/dL — ABNORMAL HIGH (ref 0–200)
HDL: 55.9 mg/dL (ref >39.00)
LDL Cholesterol: 139 mg/dL — ABNORMAL HIGH (ref 0–99)
NonHDL: 157.36
Total CHOL/HDL Ratio: 4
Triglycerides: 94 mg/dL (ref 0.0–149.0)
VLDL: 18.8 mg/dL (ref 0.0–40.0)

## 2021-01-01 LAB — TSH: TSH: 0.44 u[IU]/mL (ref 0.35–4.50)

## 2021-01-01 LAB — COMPREHENSIVE METABOLIC PANEL
ALT: 10 U/L (ref 0–35)
AST: 15 U/L (ref 0–37)
Albumin: 3.8 g/dL (ref 3.5–5.2)
Alkaline Phosphatase: 54 U/L (ref 39–117)
BUN: 16 mg/dL (ref 6–23)
CO2: 28 mEq/L (ref 19–32)
Calcium: 9.1 mg/dL (ref 8.4–10.5)
Chloride: 104 mEq/L (ref 96–112)
Creatinine, Ser: 0.77 mg/dL (ref 0.40–1.20)
GFR: 79.53 mL/min (ref >60.00)
Glucose, Bld: 85 mg/dL (ref 70–99)
Potassium: 3.8 mEq/L (ref 3.5–5.1)
Sodium: 139 mEq/L (ref 135–145)
Total Bilirubin: 0.4 mg/dL (ref 0.2–1.2)
Total Protein: 6.9 g/dL (ref 6.0–8.3)

## 2021-01-01 LAB — VITAMIN B12: Vitamin B-12: 654 pg/mL (ref 211–911)

## 2021-01-10 ENCOUNTER — Encounter: Payer: Self-pay | Admitting: Family Medicine

## 2021-01-10 DIAGNOSIS — K2970 Gastritis, unspecified, without bleeding: Secondary | ICD-10-CM | POA: Insufficient documentation

## 2021-01-19 ENCOUNTER — Telehealth: Payer: Self-pay
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
## 2021-01-19 NOTE — Telephone Encounter (Signed)
Patient has been scheduled for 01/22/21 at 11:30am.

## 2021-01-19 NOTE — Telephone Encounter (Signed)
Please schedule a HTN follow up next week in same day.  Thanks1  aw

## 2021-01-22 ENCOUNTER — Ambulatory Visit (INDEPENDENT_AMBULATORY_CARE_PROVIDER_SITE_OTHER): Payer: Medicare Other | Admitting: Family Medicine

## 2021-01-22 ENCOUNTER — Other Ambulatory Visit: Payer: Self-pay

## 2021-01-22 ENCOUNTER — Encounter: Payer: Self-pay | Admitting: Family Medicine

## 2021-01-22 VITALS — BP 110/60 | HR 71 | Temp 97.9°F | Ht 63.0 in | Wt 118.4 lb

## 2021-01-22 DIAGNOSIS — M5432 Sciatica, left side: Secondary | ICD-10-CM

## 2021-01-22 DIAGNOSIS — R030 Elevated blood-pressure reading, without diagnosis of hypertension: Secondary | ICD-10-CM | POA: Diagnosis not present

## 2021-01-22 DIAGNOSIS — R519 Headache, unspecified: Secondary | ICD-10-CM | POA: Diagnosis not present

## 2021-01-22 MED ORDER — PREDNISONE 20 MG PO TABS: 40.0000 mg | ORAL_TABLET | Freq: Every day | ORAL | 0 refills | Status: DC

## 2021-01-22 MED ORDER — IBUPROFEN 800 MG PO TABS: ORAL_TABLET | ORAL | 1 refills | Status: DC

## 2021-01-22 MED ORDER — TRAMADOL HCL 50 MG PO TABS: 50.0000 mg | ORAL_TABLET | Freq: Two times a day (BID) | ORAL | 0 refills | Status: AC | PRN
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
## 2021-01-24 ENCOUNTER — Telehealth: Payer: Self-pay

## 2021-01-24 MED ORDER — IBUPROFEN 800 MG PO TABS: 800.0000 mg | ORAL_TABLET | Freq: Three times a day (TID) | ORAL | 1 refills | Status: DC | PRN

## 2021-01-24 NOTE — Telephone Encounter (Signed)
  LAST APPOINTMENT DATE: 01/22/2021   NEXT APPOINTMENT DATE:@Visit  date not found  MEDICATION:ibuprofen (ADVIL) 800 MG tablet  PHARMACY:[id number] - [place], [place] - [address] AT [place] [address]  Comments: Patient states they received the prescription but there was no strength on it so they did not refill. Patient states pharmacy has tried contacting us several time with no response.

## 2021-01-31 ENCOUNTER — Encounter: Payer: Self-pay | Admitting: Family Medicine

## 2021-02-02 ENCOUNTER — Encounter: Payer: Self-pay | Admitting: Family Medicine

## 2021-02-05 MED ORDER — HYDROCODONE-ACETAMINOPHEN 5-325 MG PO TABS: 1.0000 | ORAL_TABLET | Freq: Four times a day (QID) | ORAL | 0 refills | Status: DC | PRN
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
## 2021-02-21 ENCOUNTER — Ambulatory Visit (INDEPENDENT_AMBULATORY_CARE_PROVIDER_SITE_OTHER): Payer: Medicare Other | Admitting: Family Medicine

## 2021-02-21 ENCOUNTER — Other Ambulatory Visit: Payer: Self-pay

## 2021-02-21 VITALS — BP 104/62 | HR 65 | Ht 63.0 in | Wt 117.8 lb

## 2021-02-21 DIAGNOSIS — M545 Low back pain, unspecified: Secondary | ICD-10-CM

## 2021-02-21 DIAGNOSIS — M7918 Myalgia, other site: Secondary | ICD-10-CM

## 2021-02-21 NOTE — Patient Instructions (Addendum)
Thank you for coming in today.  I've referred you to Physical Therapy.  Let us know if you don't hear from them in one week.  Heating pad may help.   Recheck in about 6 weeks.   Let me know sooner if this is not working.  We can do more.

## 2021-02-22 ENCOUNTER — Ambulatory Visit: Payer: Medicare Other | Attending: Family Medicine

## 2021-02-22 DIAGNOSIS — M546 Pain in thoracic spine: Secondary | ICD-10-CM | POA: Diagnosis present

## 2021-02-22 DIAGNOSIS — M542 Cervicalgia: Secondary | ICD-10-CM | POA: Diagnosis present

## 2021-02-22 DIAGNOSIS — M545 Low back pain, unspecified: Secondary | ICD-10-CM | POA: Diagnosis present

## 2021-02-22 DIAGNOSIS — M6281 Muscle weakness (generalized): Secondary | ICD-10-CM | POA: Diagnosis present

## 2021-02-22 DIAGNOSIS — R42 Dizziness and giddiness: Secondary | ICD-10-CM | POA: Insufficient documentation

## 2021-02-22 NOTE — Patient Instructions (Signed)
Access Code: [email or web address] URL: [email or web address] Date: 02/22/2021 Prepared by: Sherlynn Stalls  Exercises Seated Scapular Retraction - 1 x daily - 5 x weekly - 2 sets - 10 reps Supine Lower Trunk Rotation - 1 x daily - 5 x weekly - 2 sets - 10 reps Hooklying Single Knee to Chest Stretch - 1 x daily - 5 x weekly - 2 sets - 10 reps Seated Cervical Retraction - 1 x daily - 5 x weekly - 2 sets - 10 reps Seated March - 1 x daily - 5 x weekly - 2 sets - 10 reps
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
## 2021-02-27 ENCOUNTER — Ambulatory Visit: Payer: Medicare Other

## 2021-02-27 ENCOUNTER — Other Ambulatory Visit: Payer: Self-pay

## 2021-02-27 DIAGNOSIS — M542 Cervicalgia: Secondary | ICD-10-CM

## 2021-02-27 DIAGNOSIS — M546 Pain in thoracic spine: Secondary | ICD-10-CM

## 2021-02-27 DIAGNOSIS — R42 Dizziness and giddiness: Secondary | ICD-10-CM

## 2021-02-27 DIAGNOSIS — M545 Low back pain, unspecified: Secondary | ICD-10-CM

## 2021-02-27 DIAGNOSIS — M6281 Muscle weakness (generalized): Secondary | ICD-10-CM
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
## 2021-02-28 ENCOUNTER — Ambulatory Visit (INDEPENDENT_AMBULATORY_CARE_PROVIDER_SITE_OTHER): Payer: Medicare Other

## 2021-02-28 ENCOUNTER — Encounter: Payer: Self-pay | Admitting: Family Medicine

## 2021-02-28 ENCOUNTER — Ambulatory Visit (INDEPENDENT_AMBULATORY_CARE_PROVIDER_SITE_OTHER): Payer: Medicare Other | Admitting: Family Medicine

## 2021-02-28 VITALS — BP 100/60 | HR 88 | Ht 63.0 in | Wt 116.0 lb

## 2021-02-28 DIAGNOSIS — M542 Cervicalgia: Secondary | ICD-10-CM

## 2021-02-28 NOTE — Patient Instructions (Addendum)
Thank you for coming in today.  Please get an Xray today before you leave  Continue PT.   Keep the appointment on June the 1st.  I clearly can see you sooner.   I think the hands may be carpal tunnel.

## 2021-03-01 ENCOUNTER — Ambulatory Visit: Payer: Medicare Other

## 2021-03-01 ENCOUNTER — Other Ambulatory Visit: Payer: Self-pay

## 2021-03-01 DIAGNOSIS — R42 Dizziness and giddiness: Secondary | ICD-10-CM

## 2021-03-01 DIAGNOSIS — M542 Cervicalgia: Secondary | ICD-10-CM | POA: Diagnosis not present

## 2021-03-01 DIAGNOSIS — M545 Low back pain, unspecified: Secondary | ICD-10-CM

## 2021-03-01 DIAGNOSIS — M6281 Muscle weakness (generalized): Secondary | ICD-10-CM

## 2021-03-01 DIAGNOSIS — M546 Pain in thoracic spine: Secondary | ICD-10-CM

## 2021-03-01 NOTE — Therapy (Signed)
[place]. [place], [place], [address] Phone: [phone]   Fax:  [phone]  Physical Therapy Treatment  Patient Details  Name: Jocelyn Parker MRN: [id number] Date of Birth: 01/22/1951 Referring Provider (PT): Marikay Alar Date: 03/01/2021   PT End of Session - 03/01/21 1659    Visit Number 3    Number of Visits 17    Date for PT Re-Evaluation 04/19/21    PT Start Time 4818    PT Stop Time 1657    PT Time Calculation (min) 40 min    Activity Tolerance Patient tolerated treatment well;Patient limited by pain   Limited by dizziness   Behavior During Therapy [facility] for tasks assessed/performed           Past Medical History:  Diagnosis Date  . Allergy    sulfa  . Chronic interstitial cystitis   . Lung cancer ([place])    per patient   . Thyroid disease     Past Surgical History:  Procedure Laterality Date  . ABDOMINAL HYSTERECTOMY    . lumps removed from breast     1970's ,1990's and in 2000's  nono cancer   . LUNG SURGERY  04/05/2020    There were no vitals filed for this visit.   Subjective Assessment - 03/01/21 1625    Subjective "this just doesnt feel right" . Had an appointment for check in with Dr Berdine Addison on Monday - still getting headaches everyday and reports she would like to get his input as well about all of her symptoms.  Saw Dr Georgina Snell yesterday who was okay with continuing PT, dizzy possibly more vestibular in nature and neck more musculoskeletal but if continued concern plan to follow up with him again.    Patient Stated Goals to decrease pain, get back to where she was with walking 4 mi/day a few days a week, be able to sit and sand for longer times without pain.    Currently in Pain? No/denies    Pain Onset More than a month ago                             [facility] Adult PT Treatment/Exercise - 03/01/21 0001      Lumbar Exercises: Seated   Other Seated Lumbar Exercises  seated LAQ 10 x 2 alt.   Seated march 10 x 2 alt.   Gentle lean forward to stretch lower back/decr LB tension.      Lumbar Exercises: Supine   Other Supine Lumbar Exercises All in supported reclined position:  knee flex/ext x 10 B.  bent knee fallouts x 10 B.  gentle HS stretch within tolerable range 10" x 3 B.  Ankle DF/PF 10 x 2            Nustep L1 x 6 min BUE/LE         PT Short Term Goals - 02/22/21 1810      PT SHORT TERM GOAL #1   Title Independent with initial HEP    Time 2    Period Weeks    Status New    Target Date 03/08/21             PT Long Term Goals - 02/22/21 1810      PT LONG TERM GOAL #1   Title Independent with advanced HEP    Time 8    Period Weeks  Status New    Target Date 04/19/21      PT LONG TERM GOAL #2   Title Pt will demonstrate at least 25% improvement in cervical and thoracolumbar ROM with </= 2/10 pain    Time 8    Period Weeks    Status New    Target Date 04/19/21      PT LONG TERM GOAL #3   Title Pt will report improved tolerance to long distance walking with </= 2/10 pain    Time 8    Period Weeks    Status New    Target Date 04/19/21      PT LONG TERM GOAL #4   Title Pt will be able to negotiate stairs with no greater than 1HR using reciprocal pattern, good control, and </= 2/10 pain to facilitate safety and improved toelrance to stairs in the home and community    Time 8    Period Weeks    Status New    Target Date 04/19/21      PT LONG TERM GOAL #5   Title BUE/LE strength at least 4+/5    Time 8    Period Weeks    Status New    Target Date 04/19/21                 Plan - 03/01/21 1700    Clinical Impression Statement Jocelyn Parker presents again with continued concerns about how she has been feeling, disclosing headaches are daily and also getting some double vision in addition to nausea dizziness that feels different than her typical vertigo and havent been going away. She continues to hold her  head/neck very guarded. Imaging was ordered by referring MD who noted no acute fractures in upper c/s observed (formal radiologist impression not yet available). Todays session with emphasis on gentle LE strengthening, core stability and short range lumbar/LE stretches within tolerance. Cesily reports she had an appointment already scheduled with her neurologist this monday (Dr Berdine Addison at [facility]) with whom she has a close relationship with and she stated she will like to have him screen her further as well.    Personal Factors and Comorbidities Comorbidity 2    Stability/Clinical Decision Making Evolving/Moderate complexity    Rehab Potential Fair    PT Frequency 2x / week    PT Duration 8 weeks   6-8 wks   PT Treatment/Interventions ADLs/Self Care Home Management;Cryotherapy;Moist Heat;Stair training;Functional mobility training;Therapeutic activities;Therapeutic exercise;Balance training;Neuromuscular re-education;Manual techniques;Patient/family education;Taping;Vestibular;Canalith Repostioning;Electrical Stimulation    PT Next Visit Plan If cleared by MD, plan to work on slow progression of cervical/thoracolumbar mobility, general strength and flexbility. Lumbopelvic stabilization.    PT Home Exercise Plan see pt edu    Consulted and Agree with Plan of Care Patient           Patient will benefit from skilled therapeutic intervention in order to improve the following deficits and impairments:  Abnormal gait,Decreased range of motion,Increased muscle spasms,Dizziness,Decreased balance,Decreased mobility,Decreased strength,Impaired flexibility,Hypomobility,Pain  Visit Diagnosis: Cervicalgia  Dizziness and giddiness  Muscle weakness (generalized)  Acute low back pain, unspecified back pain laterality, unspecified whether sciatica present  Pain in thoracic spine     Problem List Patient Active Problem List   Diagnosis Date Noted  . Gastritis with intestinal metaplasia of stomach  01/10/2021  . S/P partial lobectomy of lung 04/12/2020  . Nodule of upper lobe of right lung 04/05/2020  . Bilateral breast cysts 12/27/2019  . Malignant neoplasm of upper lobe of right lung ([place])  10/12/2019  . Sensorineural hearing loss (SNHL) of both ears 06/20/2019  . Adenocarcinoma of lung, stage 1, right ([place]) 05/26/2019  . Tinnitus of both ears 05/12/2019  . B12 deficiency 04/11/2019  . Cyst of ovary 04/01/2019  . Osteopenia 04/01/2019  . Vitamin D deficiency 05/24/2015  . 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 Cyst of kidney, acquired 12/20/2014  . Interstitial cystitis (chronic) without hematuria 03/20/2012  . Bilateral fibrocystic breast disease (FCBD) 03/20/2012  . Hypothyroidism, followed by Dr. [name] 08/16/2008  . Hyperlipidemia, LDL 194, HDL 62 08/16/2008  . Shortness of breath 08/16/2008  . Nontoxic multinodular goiter 08/08/2007    Hall Busing, PT, DPT 03/01/2021, 6:15 PM  [address]. [place], [place], [address] Phone: [phone]   Fax:  [phone]  Name: Jocelyn Parker MRN: [id number] Date of Birth: [birth year]

## 2021-03-02 NOTE — Progress Notes (Signed)
X-ray cervical spine shows some arthritis changes in the neck.

## 2021-03-06 ENCOUNTER — Other Ambulatory Visit: Payer: Self-pay

## 2021-03-06 ENCOUNTER — Ambulatory Visit: Payer: Medicare Other | Attending: Family Medicine

## 2021-03-06 DIAGNOSIS — R42 Dizziness and giddiness: Secondary | ICD-10-CM | POA: Insufficient documentation

## 2021-03-06 DIAGNOSIS — M545 Low back pain, unspecified: Secondary | ICD-10-CM | POA: Diagnosis present

## 2021-03-06 DIAGNOSIS — M6281 Muscle weakness (generalized): Secondary | ICD-10-CM | POA: Insufficient documentation

## 2021-03-06 DIAGNOSIS — M546 Pain in thoracic spine: Secondary | ICD-10-CM | POA: Insufficient documentation

## 2021-03-06 DIAGNOSIS — M542 Cervicalgia: Secondary | ICD-10-CM | POA: Diagnosis present

## 2021-03-06 NOTE — Therapy (Signed)
[place]. [place], [place], [address] Phone: [phone]   Fax:  [phone]  Physical Therapy Treatment  Patient Details  Name: Jocelyn Parker MRN: [id number] Date of Birth: 19-Oct-1951 Referring Provider (PT): Marikay Alar Date: 03/06/2021   PT End of Session - 03/06/21 0929    Visit Number 4    Number of Visits 17    Date for PT Re-Evaluation 04/19/21    PT Start Time 0846    PT Stop Time 0928    PT Time Calculation (min) 42 min    Activity Tolerance Patient tolerated treatment well;Patient limited by pain   Limited by dizziness   Behavior During Therapy [facility] for tasks assessed/performed           Past Medical History:  Diagnosis Date  . Allergy    sulfa  . Chronic interstitial cystitis   . Lung cancer ([place])    per patient   . Thyroid disease     Past Surgical History:  Procedure Laterality Date  . ABDOMINAL HYSTERECTOMY    . lumps removed from breast     1970's ,1990's and in 2000's  nono cancer   . LUNG SURGERY  04/05/2020    There were no vitals filed for this visit.   Subjective Assessment - 03/06/21 0928    Subjective Saw Dr Berdine Addison who did some preliminary blood tests before further imaging, Jaiya says will expect blood results to return in about a week.    Patient Stated Goals to decrease pain, get back to where she was with walking 4 mi/day a few days a week, be able to sit and sand for longer times without pain.    Currently in Pain? Yes    Pain Score 5    left side of neck and lower back   Pain Onset More than a month ago                             [facility] Adult PT Treatment/Exercise - 03/06/21 0001      Neck Exercises: Machines for Strengthening   UBE (Upper Arm Bike) L1 2 min each way    Nustep L3 x 2 min, L 2 x 3 min d/t leg mms fatigue      Neck Exercises: Theraband   Scapula Retraction 20 reps      Lumbar Exercises: Stretches   Other Lumbar Stretch  Exercise trunk rotation in sitting within tolerated range x5 bil. standing pball forward rollouts on table within tolerance x 10 (incr dizziness)      Lumbar Exercises: Seated   Other Seated Lumbar Exercises pelvic tilts on dynadisc x 10 AP with some incr pain. x 20 lateral without pain.      Manual Therapy   Manual therapy comments STM and MFR to lumbar paraspinals and QL, periscap mms, UT/LS on the left. Gentle cervical distraction relieved tingling in left arm.                    PT Short Term Goals - 02/22/21 1810      PT SHORT TERM GOAL #1   Title Independent with initial HEP    Time 2    Period Weeks    Status New    Target Date 03/08/21             PT Long Term Goals - 02/22/21 1810  PT LONG TERM GOAL #1   Title Independent with advanced HEP    Time 8    Period Weeks    Status New    Target Date 04/19/21      PT LONG TERM GOAL #2   Title Pt will demonstrate at least 25% improvement in cervical and thoracolumbar ROM with </= 2/10 pain    Time 8    Period Weeks    Status New    Target Date 04/19/21      PT LONG TERM GOAL #3   Title Pt will report improved tolerance to long distance walking with </= 2/10 pain    Time 8    Period Weeks    Status New    Target Date 04/19/21      PT LONG TERM GOAL #4   Title Pt will be able to negotiate stairs with no greater than 1HR using reciprocal pattern, good control, and </= 2/10 pain to facilitate safety and improved toelrance to stairs in the home and community    Time 8    Period Weeks    Status New    Target Date 04/19/21      PT LONG TERM GOAL #5   Title BUE/LE strength at least 4+/5    Time 8    Period Weeks    Status New    Target Date 04/19/21                 Plan - 03/06/21 0930    Clinical Impression Statement Cristianna tolerated gentle progression of exercises fairly today. C/o some incr left LBP with pelvic tilts AP but this improved with lateral tilts. Included some seated  retractions today which pt reported started to get some tingling into left shoulder. With gentle distraction in sitting this sensation started to subside.    Personal Factors and Comorbidities Comorbidity 2    Stability/Clinical Decision Making Evolving/Moderate complexity    Rehab Potential Fair    PT Frequency 2x / week    PT Duration 8 weeks   6-8 wks   PT Treatment/Interventions ADLs/Self Care Home Management;Cryotherapy;Moist Heat;Stair training;Functional mobility training;Therapeutic activities;Therapeutic exercise;Balance training;Neuromuscular re-education;Manual techniques;Patient/family education;Taping;Vestibular;Canalith Repostioning;Electrical Stimulation    PT Next Visit Plan Work on slow progression of cervical/thoracolumbar mobility, general strength and flexbility. Lumbopelvic stabilization. vestibular habituation vs compensation, gaze stability if tolerated.    PT Home Exercise Plan see pt edu    Consulted and Agree with Plan of Care Patient           Patient will benefit from skilled therapeutic intervention in order to improve the following deficits and impairments:  Abnormal gait,Decreased range of motion,Increased muscle spasms,Dizziness,Decreased balance,Decreased mobility,Decreased strength,Impaired flexibility,Hypomobility,Pain  Visit Diagnosis: Cervicalgia  Dizziness and giddiness  Muscle weakness (generalized)  Acute low back pain, unspecified back pain laterality, unspecified whether sciatica present  Pain in thoracic spine     Problem List Patient Active Problem List   Diagnosis Date Noted  . Gastritis with intestinal metaplasia of stomach 01/10/2021  . S/P partial lobectomy of lung 04/12/2020  . Nodule of upper lobe of right lung 04/05/2020  . Bilateral breast cysts 12/27/2019  . Malignant neoplasm of upper lobe of right lung ([place]) 10/12/2019  . Sensorineural hearing loss (SNHL) of both ears 06/20/2019  . Adenocarcinoma of lung, stage 1, right  ([place]) 05/26/2019  . Tinnitus of both ears 05/12/2019  . B12 deficiency 04/11/2019  . Cyst of ovary 04/01/2019  . Osteopenia 04/01/2019  . Vitamin D deficiency  05/24/2015  . 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 Cyst of kidney, acquired 12/20/2014  . Interstitial cystitis (chronic) without hematuria 03/20/2012  . Bilateral fibrocystic breast disease (FCBD) 03/20/2012  . Hypothyroidism, followed by Dr. [name] 08/16/2008  . Hyperlipidemia, LDL 194, HDL 62 08/16/2008  . Shortness of breath 08/16/2008  . Nontoxic multinodular goiter 08/08/2007    Hall Busing, PT, DPT 03/06/2021, 11:55 AM  [place]. [place], [place], [address] Phone: [phone]   Fax:  [phone]  Name: Aideliz Garmany MRN: [id number] Date of Birth: 1951/01/29

## 2021-03-08 ENCOUNTER — Ambulatory Visit: Payer: Medicare Other

## 2021-03-08 ENCOUNTER — Other Ambulatory Visit: Payer: Self-pay

## 2021-03-08 DIAGNOSIS — M6281 Muscle weakness (generalized): Secondary | ICD-10-CM

## 2021-03-08 DIAGNOSIS — R42 Dizziness and giddiness: Secondary | ICD-10-CM

## 2021-03-08 DIAGNOSIS — M546 Pain in thoracic spine: Secondary | ICD-10-CM

## 2021-03-08 DIAGNOSIS — M545 Low back pain, unspecified: Secondary | ICD-10-CM

## 2021-03-08 DIAGNOSIS — M542 Cervicalgia: Secondary | ICD-10-CM

## 2021-03-08 NOTE — Therapy (Signed)
[place]. [place], [place], [address] Phone: [phone]   Fax:  [phone]  Physical Therapy Treatment  Patient Details  Name: Nakita Santerre MRN: [id number] Date of Birth: 10-Jun-1951 Referring Provider (PT): Marikay Alar Date: 03/08/2021   PT End of Session - 03/08/21 1623    Visit Number 5    Number of Visits 17    Date for PT Re-Evaluation 04/19/21    PT Start Time 1619    PT Stop Time 1700    PT Time Calculation (min) 41 min    Activity Tolerance Patient tolerated treatment well;Patient limited by pain   Limited by dizziness   Behavior During Therapy [facility] for tasks assessed/performed           Past Medical History:  Diagnosis Date  . Allergy    sulfa  . Chronic interstitial cystitis   . Lung cancer ([place])    per patient   . Thyroid disease     Past Surgical History:  Procedure Laterality Date  . ABDOMINAL HYSTERECTOMY    . lumps removed from breast     1970's ,1990's and in 2000's  nono cancer   . LUNG SURGERY  04/05/2020    There were no vitals filed for this visit.   Subjective Assessment - 03/08/21 1621    Subjective a little more neck pain after last visit but doing okay, otherwise the same    Patient Stated Goals to decrease pain, get back to where she was with walking 4 mi/day a few days a week, be able to sit and sand for longer times without pain.    Currently in Pain? Yes    Pain Score 5    L neck and lower back   Pain Onset More than a month ago                   [facility] Adult PT Treatment/Exercise - 03/08/21 0001      Neck Exercises: Machines for Strengthening   UBE (Upper Arm Bike) L1, 2 min each way    Nustep L2 x 5      Neck Exercises: Seated   Other Seated Exercise Row yellow TB 10 x . Shoulder shrugg 10 x2          LTR x 10 B in supine. Sidelying open books with arm bent x 10 B  Manual: STM left UT, gentle STM thoracic paraspinals B, Right inferior  scap.    Theract: BP assessed and 122/75 WNL. Education on pain         PT Short Term Goals - 02/22/21 1810      PT SHORT TERM GOAL #1   Title Independent with initial HEP    Time 2    Period Weeks    Status New    Target Date 03/08/21             PT Long Term Goals - 02/22/21 1810      PT LONG TERM GOAL #1   Title Independent with advanced HEP    Time 8    Period Weeks    Status New    Target Date 04/19/21      PT LONG TERM GOAL #2   Title Pt will demonstrate at least 25% improvement in cervical and thoracolumbar ROM with </= 2/10 pain    Time 8    Period Weeks    Status New    Target Date 04/19/21  PT LONG TERM GOAL #3   Title Pt will report improved tolerance to long distance walking with </= 2/10 pain    Time 8    Period Weeks    Status New    Target Date 04/19/21      PT LONG TERM GOAL #4   Title Pt will be able to negotiate stairs with no greater than 1HR using reciprocal pattern, good control, and </= 2/10 pain to facilitate safety and improved toelrance to stairs in the home and community    Time 8    Period Weeks    Status New    Target Date 04/19/21      PT LONG TERM GOAL #5   Title BUE/LE strength at least 4+/5    Time 8    Period Weeks    Status New    Target Date 04/19/21                 Plan - 03/08/21 1659    Clinical Impression Statement We spent alot of time discussing pain and relation to mms spasms/strains today. Tolerated therex fairly with some increased thoracicpain reported with shoulder shruggs. able to localize pain point at lower thoracic on the right where rib meets spine, pain increase with rotation in sitting. able to tolerate modified open book rotations in sidelying with not as much pain. Dizziness continues especially with transitions, spoke to [facility] about willingness to try testing for BPPV next visit and educated on maneuver and potential for reproduction of symptoms and she verbalized agreement, husband to  drive her here next visit.    Personal Factors and Comorbidities Comorbidity 2    Stability/Clinical Decision Making Evolving/Moderate complexity    Rehab Potential Fair    PT Frequency 2x / week    PT Duration 8 weeks   6-8 wks   PT Treatment/Interventions ADLs/Self Care Home Management;Cryotherapy;Moist Heat;Stair training;Functional mobility training;Therapeutic activities;Therapeutic exercise;Balance training;Neuromuscular re-education;Manual techniques;Patient/family education;Taping;Vestibular;Canalith Repostioning;Electrical Stimulation    PT Next Visit Plan Work on slow progression of cervical/thoracolumbar mobility, general strength and flexbility. Lumbopelvic stabilization. vestibular habituation vs compensation, gaze stability if tolerated. Dix hallpike/epley if indicated next visit.    PT Home Exercise Plan see pt edu    Consulted and Agree with Plan of Care Patient           Patient will benefit from skilled therapeutic intervention in order to improve the following deficits and impairments:  Abnormal gait,Decreased range of motion,Increased muscle spasms,Dizziness,Decreased balance,Decreased mobility,Decreased strength,Impaired flexibility,Hypomobility,Pain  Visit Diagnosis: Cervicalgia  Dizziness and giddiness  Muscle weakness (generalized)  Acute low back pain, unspecified back pain laterality, unspecified whether sciatica present  Pain in thoracic spine     Problem List Patient Active Problem List   Diagnosis Date Noted  . Gastritis with intestinal metaplasia of stomach 01/10/2021  . S/P partial lobectomy of lung 04/12/2020  . Nodule of upper lobe of right lung 04/05/2020  . Bilateral breast cysts 12/27/2019  . Malignant neoplasm of upper lobe of right lung ([place]) 10/12/2019  . Sensorineural hearing loss (SNHL) of both ears 06/20/2019  . Adenocarcinoma of lung, stage 1, right ([place]) 05/26/2019  . Tinnitus of both ears 05/12/2019  . B12 deficiency 04/11/2019   . Cyst of ovary 04/01/2019  . Osteopenia 04/01/2019  . Vitamin D deficiency 05/24/2015  . 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 Cyst of kidney, acquired 12/20/2014  . Interstitial cystitis (chronic) without hematuria 03/20/2012  . Bilateral fibrocystic breast disease (FCBD) 03/20/2012  . Hypothyroidism, followed  by Dr. [name] 08/16/2008  . Hyperlipidemia, LDL 194, HDL 62 08/16/2008  . Shortness of breath 08/16/2008  . Nontoxic multinodular goiter 08/08/2007    Hall Busing, PT, DPT 03/08/2021, 5:11 PM  [place]. [place], [place], [address] Phone: [phone]   Fax:  [phone]  Name: Delmar Dondero MRN: [id number] Date of Birth: [birth year]

## 2021-03-09 ENCOUNTER — Other Ambulatory Visit: Payer: Self-pay | Admitting: Family Medicine

## 2021-03-09 ENCOUNTER — Telehealth: Payer: Self-pay

## 2021-03-09 NOTE — Telephone Encounter (Signed)
MEDICATION:  cyanocobalamin (,VITAMIN B-12,) 1000 MCG/ML injection   PHARMACY:Comments: [facility]   **Let patient know to contact pharmacy at the end of the day to make sure medication is ready. **  ** Please notify patient to allow 48-72 hours to process**  **Encourage patient to contact the pharmacy for refills or they can request refills through [facility]**

## 2021-03-13 ENCOUNTER — Ambulatory Visit: Payer: Medicare Other

## 2021-03-13 ENCOUNTER — Ambulatory Visit (INDEPENDENT_AMBULATORY_CARE_PROVIDER_SITE_OTHER): Payer: Medicare Other | Admitting: Dermatology

## 2021-03-13 ENCOUNTER — Encounter: Payer: Self-pay | Admitting: Dermatology

## 2021-03-13 ENCOUNTER — Other Ambulatory Visit: Payer: Self-pay

## 2021-03-13 DIAGNOSIS — L988 Other specified disorders of the skin and subcutaneous tissue: Secondary | ICD-10-CM | POA: Diagnosis not present

## 2021-03-13 DIAGNOSIS — L249 Irritant contact dermatitis, unspecified cause: Secondary | ICD-10-CM

## 2021-03-13 DIAGNOSIS — L720 Epidermal cyst: Secondary | ICD-10-CM

## 2021-03-13 DIAGNOSIS — M542 Cervicalgia: Secondary | ICD-10-CM | POA: Diagnosis not present

## 2021-03-13 DIAGNOSIS — M546 Pain in thoracic spine: Secondary | ICD-10-CM

## 2021-03-13 DIAGNOSIS — M6281 Muscle weakness (generalized): Secondary | ICD-10-CM

## 2021-03-13 DIAGNOSIS — L658 Other specified nonscarring hair loss: Secondary | ICD-10-CM

## 2021-03-13 DIAGNOSIS — R42 Dizziness and giddiness: Secondary | ICD-10-CM

## 2021-03-13 DIAGNOSIS — Z1283 Encounter for screening for malignant neoplasm of skin: Secondary | ICD-10-CM

## 2021-03-13 DIAGNOSIS — M545 Low back pain, unspecified: Secondary | ICD-10-CM

## 2021-03-13 NOTE — Patient Instructions (Signed)
OVER THE COUNTER 5 % ROGAINE TOPICAL NIGHTLY FOR FRONTAL HAIR SHEDDING

## 2021-03-13 NOTE — Therapy (Signed)
[place]. [place], [place], [address] Phone: [phone]   Fax:  [phone]  Physical Therapy Treatment  Patient Details  Name: Jocelyn Parker MRN: [id number] Date of Birth: 1950/12/23 Referring Provider (PT): Marikay Alar Date: 03/13/2021   PT End of Session - 03/13/21 1406    Visit Number 6    Number of Visits 17    Date for PT Re-Evaluation 04/19/21    PT Start Time 3888    PT Stop Time 1406    PT Time Calculation (min) 33 min    Activity Tolerance Patient tolerated treatment well;Patient limited by pain    Behavior During Therapy [facility] for tasks assessed/performed           Past Medical History:  Diagnosis Date  . Allergy    sulfa  . Chronic interstitial cystitis   . Lung cancer ([place])    per patient   . Thyroid disease     Past Surgical History:  Procedure Laterality Date  . ABDOMINAL HYSTERECTOMY    . lumps removed from breast     1970's ,1990's and in 2000's  nono cancer   . LUNG SURGERY  04/05/2020    There were no vitals filed for this visit.   Subjective Assessment - 03/13/21 1336    Subjective Felt very sick after last session, took medications for 3 days after. Thought appointment was at 1:30 pm so arrived late to session.    Patient Stated Goals to decrease pain, get back to where she was with walking 4 mi/day a few days a week, be able to sit and sand for longer times without pain.    Pain Onset More than a month ago                   Vestibular Assessment - 03/13/21 0001      Positional Testing   Dix-Hallpike Dix-Hallpike Right;Dix-Hallpike Left      Dix-Hallpike Right   Dix-Hallpike Right Duration negative - no reproduction of symptoms or any nystagmus      Dix-Hallpike Left   Dix-Hallpike Left Duration negative - no reproduction of symptoms or any nystagmus      Positional Sensitivities   Pivot Right in Standing --   x3 - very slow and cautious to minimize  sx   Pivot Left in Standing --   x3 - very slow and cautious to minimize sx                   [facility] Adult PT Treatment/Exercise - 03/13/21 0001      Therapeutic Activites    Therapeutic Activities Other Therapeutic Activities    Other Therapeutic Activities Education regarding vestibular exercises for home. Soft tissue assessment and gentle mobilization along right posterior rib cage.                    PT Short Term Goals - 02/22/21 1810      PT SHORT TERM GOAL #1   Title Independent with initial HEP    Time 2    Period Weeks    Status New    Target Date 03/08/21             PT Long Term Goals - 02/22/21 1810      PT LONG TERM GOAL #1   Title Independent with advanced HEP    Time 8    Period Weeks    Status New  Target Date 04/19/21      PT LONG TERM GOAL #2   Title Pt will demonstrate at least 25% improvement in cervical and thoracolumbar ROM with </= 2/10 pain    Time 8    Period Weeks    Status New    Target Date 04/19/21      PT LONG TERM GOAL #3   Title Pt will report improved tolerance to long distance walking with </= 2/10 pain    Time 8    Period Weeks    Status New    Target Date 04/19/21      PT LONG TERM GOAL #4   Title Pt will be able to negotiate stairs with no greater than 1HR using reciprocal pattern, good control, and </= 2/10 pain to facilitate safety and improved toelrance to stairs in the home and community    Time 8    Period Weeks    Status New    Target Date 04/19/21      PT LONG TERM GOAL #5   Title BUE/LE strength at least 4+/5    Time 8    Period Weeks    Status New    Target Date 04/19/21                 Plan - 03/13/21 1406    Clinical Impression Statement Session began late today d/t confusion regarding appointment times but able to be accomodated for shortened PT session. Still having that thoracic pain on the right near surgical site - pt reports recently having PET scan and it being  unremarkable but is scheduled to follow up with oncologist in a week or so. Area was inspected and incision areas grossly well healingwith slight puckering noted along the 2 posterior incisions but with generally with good soft tissue mobility. Discussed follow up with oncology regarding the pain in this area that seems to be unchanged.   Was able to screen dix-hallpike bilaterally today using modified supported version  and negative bilateral for BPPV.  having alot of visual motion sensitivity . Discussed potential benefit of vestibular habituation exercises for post concussion vestib symptoms as tolerated to try and improve vestib function and pt agreeable to trying some more vestib eye exercises at home.    Personal Factors and Comorbidities Comorbidity 2    Stability/Clinical Decision Making Evolving/Moderate complexity    Rehab Potential Fair    PT Frequency 2x / week    PT Duration 8 weeks   6-8 wks   PT Treatment/Interventions ADLs/Self Care Home Management;Cryotherapy;Moist Heat;Stair training;Functional mobility training;Therapeutic activities;Therapeutic exercise;Balance training;Neuromuscular re-education;Manual techniques;Patient/family education;Taping;Vestibular;Canalith Repostioning;Electrical Stimulation    PT Next Visit Plan Work on slow progression of cervical/thoracolumbar mobility, general strength and flexbility. Lumbopelvic stabilization. vestibular habituation vs compensation, gaze stability if tolerated.    PT Home Exercise Plan see pt edu    Consulted and Agree with Plan of Care Patient           Patient will benefit from skilled therapeutic intervention in order to improve the following deficits and impairments:  Abnormal gait,Decreased range of motion,Increased muscle spasms,Dizziness,Decreased balance,Decreased mobility,Decreased strength,Impaired flexibility,Hypomobility,Pain  Visit Diagnosis: Cervicalgia  Dizziness and giddiness  Muscle weakness  (generalized)  Acute low back pain, unspecified back pain laterality, unspecified whether sciatica present  Pain in thoracic spine     Problem List Patient Active Problem List   Diagnosis Date Noted  . Gastritis with intestinal metaplasia of stomach 01/10/2021  . S/P partial lobectomy of lung 04/12/2020  .  Nodule of upper lobe of right lung 04/05/2020  . Bilateral breast cysts 12/27/2019  . Malignant neoplasm of upper lobe of right lung ([place]) 10/12/2019  . Sensorineural hearing loss (SNHL) of both ears 06/20/2019  . Adenocarcinoma of lung, stage 1, right ([place]) 05/26/2019  . Tinnitus of both ears 05/12/2019  . B12 deficiency 04/11/2019  . Cyst of ovary 04/01/2019  . Osteopenia 04/01/2019  . Vitamin D deficiency 05/24/2015  . 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 Cyst of kidney, acquired 12/20/2014  . Interstitial cystitis (chronic) without hematuria 03/20/2012  . Bilateral fibrocystic breast disease (FCBD) 03/20/2012  . Hypothyroidism, followed by Dr. [name] 08/16/2008  . Hyperlipidemia, LDL 194, HDL 62 08/16/2008  . Shortness of breath 08/16/2008  . Nontoxic multinodular goiter 08/08/2007    Hall Busing, PT, DPT 03/13/2021, 3:53 PM  [place]. [place], [place], [address] Phone: [phone]   Fax:  [phone]  Name: Jocelyn Parker MRN: [id number] Date of Birth: [birth year]

## 2021-03-15 ENCOUNTER — Other Ambulatory Visit: Payer: Self-pay

## 2021-03-15 ENCOUNTER — Ambulatory Visit: Payer: Medicare Other

## 2021-03-15 DIAGNOSIS — M545 Low back pain, unspecified: Secondary | ICD-10-CM

## 2021-03-15 DIAGNOSIS — M6281 Muscle weakness (generalized): Secondary | ICD-10-CM

## 2021-03-15 DIAGNOSIS — R42 Dizziness and giddiness: Secondary | ICD-10-CM

## 2021-03-15 DIAGNOSIS — M542 Cervicalgia: Secondary | ICD-10-CM

## 2021-03-15 NOTE — Therapy (Signed)
[place]. [place], [place], [address] Phone: [phone]   Fax:  [phone]  Physical Therapy Treatment  Patient Details  Name: Jocelyn Parker MRN: [id number] Date of Birth: 1951/05/01 Referring Provider (PT): Marikay Alar Date: 03/15/2021   PT End of Session - 03/15/21 1624    Visit Number 7    Number of Visits 17    Date for PT Re-Evaluation 04/19/21    PT Start Time 1618    PT Stop Time 1700    PT Time Calculation (min) 42 min    Activity Tolerance Patient tolerated treatment well;Patient limited by pain    Behavior During Therapy [facility] for tasks assessed/performed           Past Medical History:  Diagnosis Date  . Allergy    sulfa  . Chronic interstitial cystitis   . Lung cancer ([place])    per patient   . Thyroid disease     Past Surgical History:  Procedure Laterality Date  . ABDOMINAL HYSTERECTOMY    . lumps removed from breast     1970's ,1990's and in 2000's  nono cancer   . LUNG SURGERY  04/05/2020    There were no vitals filed for this visit.   Subjective Assessment - 03/15/21 1621    Subjective Heard from Dr hill, blood work showed just some arthritis, prescribing some medications for dizziness. Layed down (reclined) for a while todaywhich helped a little with the pain    Patient Stated Goals to decrease pain, get back to where she was with walking 4 mi/day a few days a week, be able to sit and sand for longer times without pain.    Currently in Pain? Yes   slight HA when first up in the morning. Otherwise pain in rib and low back   Pain Score 4     Pain Location Back   and neck   Pain Onset More than a month ago                             [facility] Adult PT Treatment/Exercise - 03/15/21 0001      Neck Exercises: Machines for Strengthening   Nustep L3 x  6 minutes      Neck Exercises: Seated   Other Seated Exercise Scap retractions 10 x 2      Lumbar  Exercises: Seated   Sit to Stand --   on firm airex 5 x 2   Other Seated Lumbar Exercises seated small ball ab iso 5" 10 x 2. Seated hip ADD with ball 10 x 2. seated hip ABD yellow TB 10 x 2. short range marches x 10 alt B      Manual Therapy   Manual therapy comments STM and MFR to lumbar paraspinals and QL, periscap mms, UT/LS on the left.           Education: regarding indications for, theory and procedure of dry needling as an adjunct for mms spasm pain          PT Short Term Goals - 02/22/21 1810      PT SHORT TERM GOAL #1   Title Independent with initial HEP    Time 2    Period Weeks    Status New    Target Date 03/08/21             PT Long Term Goals - 02/22/21  [place] #1   Title Independent with advanced HEP    Time 8    Period Weeks    Status New    Target Date 04/19/21      PT LONG TERM GOAL #2   Title Pt will demonstrate at least 25% improvement in cervical and thoracolumbar ROM with </= 2/10 pain    Time 8    Period Weeks    Status New    Target Date 04/19/21      PT LONG TERM GOAL #3   Title Pt will report improved tolerance to long distance walking with </= 2/10 pain    Time 8    Period Weeks    Status New    Target Date 04/19/21      PT LONG TERM GOAL #4   Title Pt will be able to negotiate stairs with no greater than 1HR using reciprocal pattern, good control, and </= 2/10 pain to facilitate safety and improved toelrance to stairs in the home and community    Time 8    Period Weeks    Status New    Target Date 04/19/21      PT LONG TERM GOAL #5   Title BUE/LE strength at least 4+/5    Time 8    Period Weeks    Status New    Target Date 04/19/21                 Plan - 03/15/21 1624    Clinical Impression Statement Tried eye exercises laying down and got dizzy , did a few today before therapy, refused vestibular exercises for in therapy session today given she had just eaten and did not want to get sick.  Discussed importance of keeping up with these at home as tolerated daily to work on improving vestibular function. Able to tolerate gentle core and hip strength in sitting today. She continues to hold body very stiffly and with alot of tension with mms spasms, discussed today potential benefit of dry needling for areas of high mms spasm and she was given informational handout to decide if she would like to trial that in a subsequent session    Personal Factors and Comorbidities Comorbidity 2    Stability/Clinical Decision Making Evolving/Moderate complexity    Rehab Potential Fair    PT Frequency 2x / week    PT Duration 8 weeks   6-8 wks   PT Treatment/Interventions ADLs/Self Care Home Management;Cryotherapy;Moist Heat;Stair training;Functional mobility training;Therapeutic activities;Therapeutic exercise;Balance training;Neuromuscular re-education;Manual techniques;Patient/family education;Taping;Vestibular;Canalith Repostioning;Electrical Stimulation    PT Next Visit Plan Work on slow progression of cervical/thoracolumbar mobility, general strength and flexbility. Lumbopelvic stabilization. vestibular habituation vs compensation, gaze stability if tolerated.    PT Home Exercise Plan see pt edu    Consulted and Agree with Plan of Care Patient           Patient will benefit from skilled therapeutic intervention in order to improve the following deficits and impairments:  Abnormal gait,Decreased range of motion,Increased muscle spasms,Dizziness,Decreased balance,Decreased mobility,Decreased strength,Impaired flexibility,Hypomobility,Pain  Visit Diagnosis: Cervicalgia  Dizziness and giddiness  Muscle weakness (generalized)  Acute low back pain, unspecified back pain laterality, unspecified whether sciatica present     Problem List Patient Active Problem List   Diagnosis Date Noted  . Gastritis with intestinal metaplasia of stomach 01/10/2021  . S/P partial lobectomy of lung 04/12/2020   . Nodule of upper lobe of right lung 04/05/2020  .  Bilateral breast cysts 12/27/2019  . Malignant neoplasm of upper lobe of right lung ([place]) 10/12/2019  . Sensorineural hearing loss (SNHL) of both ears 06/20/2019  . Adenocarcinoma of lung, stage 1, right ([place]) 05/26/2019  . Tinnitus of both ears 05/12/2019  . B12 deficiency 04/11/2019  . Cyst of ovary 04/01/2019  . Osteopenia 04/01/2019  . Vitamin D deficiency 05/24/2015  . 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 Cyst of kidney, acquired 12/20/2014  . Interstitial cystitis (chronic) without hematuria 03/20/2012  . Bilateral fibrocystic breast disease (FCBD) 03/20/2012  . Hypothyroidism, followed by Dr. [name] 08/16/2008  . Hyperlipidemia, LDL 194, HDL 62 08/16/2008  . Shortness of breath 08/16/2008  . Nontoxic multinodular goiter 08/08/2007    Hall Busing , PT, DPT 03/15/2021, 6:02 PM  [place]. [place], [place], [address] Phone: [phone]   Fax:  [phone]  Name: Jocelyn Parker MRN: [id number] Date of Birth: 20-May-1951

## 2021-03-15 NOTE — Patient Instructions (Signed)

## 2021-03-20 ENCOUNTER — Other Ambulatory Visit: Payer: Self-pay

## 2021-03-20 ENCOUNTER — Ambulatory Visit: Payer: Medicare Other

## 2021-03-20 DIAGNOSIS — M542 Cervicalgia: Secondary | ICD-10-CM | POA: Diagnosis not present

## 2021-03-20 DIAGNOSIS — M546 Pain in thoracic spine: Secondary | ICD-10-CM

## 2021-03-20 DIAGNOSIS — M545 Low back pain, unspecified: Secondary | ICD-10-CM

## 2021-03-20 DIAGNOSIS — R42 Dizziness and giddiness: Secondary | ICD-10-CM

## 2021-03-20 DIAGNOSIS — M6281 Muscle weakness (generalized): Secondary | ICD-10-CM

## 2021-03-20 NOTE — Patient Instructions (Addendum)
Access Code: [email or web address] URL: [email or web address] Date: 03/20/2021 Prepared by: Sherlynn Stalls  Exercises Seated Shoulder Horizontal Abduction with Resistance - 1 x daily - 4 x weekly - 1-2 sets - 10 reps Seated Bilateral Shoulder External Rotation with Resistance - 1 x daily - 4 x weekly - 1-2 sets - 10 reps Seated Shoulder Rolls - 1 x daily - 4 x weekly - 1-2 sets - 10 reps Seated March - 1 x daily - 4 x weekly - 2 sets - 10 reps Seated Long Arc Quad - 1 x daily - 4 x weekly - 2 sets - 10 reps  Gaze stabilization in sitting horizontal and vertical: 10 seconds each direction 2-3x/day  Trigger Point Dry Needling  . What is Trigger Point Dry Needling (DN)? o DN is a physical therapy technique used to treat muscle pain and dysfunction. Specifically, DN helps deactivate muscle trigger points (muscle knots).  o A thin filiform needle is used to penetrate the skin and stimulate the underlying trigger point. The goal is for a local twitch response (LTR) to occur and for the trigger point to relax. No medication of any kind is injected during the procedure.   . What Does Trigger Point Dry Needling Feel Like?  o The procedure feels different for each individual patient. Some patients report that they do not actually feel the needle enter the skin and overall the process is not painful. Very mild bleeding may occur. However, many patients feel a deep cramping in the muscle in which the needle was inserted. This is the local twitch response.   Marland Kitchen How Will I feel after the treatment? o Soreness is normal, and the onset of soreness may not occur for a few hours. Typically this soreness does not last longer than two days.  o Bruising is uncommon, however; ice can be used to decrease any possible bruising.  o In rare cases feeling tired or nauseous after the treatment is normal. In addition, your symptoms may get worse before they get better, this period will typically not last longer than  24 hours.   . What Can I do After My Treatment? o Increase your hydration by drinking more water for the next 24 hours. o You may place ice or heat on the areas treated that have become sore, however, do not use heat on inflamed or bruised areas. Heat often brings more relief post needling. o You can continue your regular activities, but vigorous activity is not recommended initially after the treatment for 24 hours. o DN is best combined with other physical therapy such as strengthening, stretching, and other therapies.

## 2021-03-20 NOTE — Therapy (Signed)
[place]. [place], [place], [address] Phone: [phone]   Fax:  [phone]  Physical Therapy Treatment  Patient Details  Name: Jocelyn Parker MRN: [id number] Date of Birth: [birth year] Referring Provider (PT): Marikay Alar Date: 03/20/2021   PT End of Session - 03/20/21 1101    Visit Number 8    Number of Visits 17    Date for PT Re-Evaluation 04/19/21    PT Start Time 7902    PT Stop Time 1150    PT Time Calculation (min) 48 min    Activity Tolerance Patient tolerated treatment well;Patient limited by pain    Behavior During Therapy [facility] for tasks assessed/performed           Past Medical History:  Diagnosis Date  . Allergy    sulfa  . Chronic interstitial cystitis   . Lung cancer ([place])    per patient   . Thyroid disease     Past Surgical History:  Procedure Laterality Date  . ABDOMINAL HYSTERECTOMY    . lumps removed from breast     1970's ,1990's and in 2000's  nono cancer   . LUNG SURGERY  04/05/2020    There were no vitals filed for this visit.   Subjective Assessment - 03/20/21 1103    Subjective "better" eye mvmts stil hard but trying them 2x/day. Some of back pain not as intense but neck still bothersome. Willing to try dry needling for the neck pain today    Patient Stated Goals to decrease pain, get back to where she was with walking 4 mi/day a few days a week, be able to sit and sand for longer times without pain.    Currently in Pain? Yes    Pain Score 4     Pain Location Neck-  left   Pain Onset More than a month ago    Multiple Pain Sites Yes    Pain Score 3    Pain Location Back    Pain Orientation Left                             [facility] Adult PT Treatment/Exercise - 03/20/21 0001      Neck Exercises: Machines for Strengthening   Nustep L3 x  6 minutes      Neck Exercises: Seated   Other Seated Exercise yellow TB: rows x 10, B ER x 10, horizontal  ABD x 10      Lumbar Exercises: Seated   Long Arc Quad on Chair Strengthening;Both;2 sets;10 reps    LAQ on Chair Weights (lbs) 1#    Other Seated Lumbar Exercises seated small ball ab iso 5" 10 x 2. Seated hip ADD with ball 10 x 2. seated hip ABD yellow TB 10 x 2. short range marches 2x10 alt B.    Other Seated Lumbar Exercises yellow TB HS curl 10 x 2 B      Modalities   Modalities Moist Heat      Moist Heat Therapy   Number Minutes Moist Heat 10 Minutes    Moist Heat Location --   left UT     Manual Therapy   Manual Therapy Soft tissue mobilization    Manual therapy comments STM and MFR to lumbar paraspinals and QL, periscap mms, UT/LS on the left.    Soft tissue mobilization to the left upper trap, some gentle upper trap and levator  stretch            Trigger Point Dry Needling - 03/20/21 0001    Consent Given? Yes    Education Handout Provided Yes    Muscles Treated Head and Neck Upper trapezius    Upper Trapezius Response Twitch reponse elicited   she did not tolerate this, used small gauge needle and one time               PT Education - 03/20/21 1144    Education Details Updated home exercises for strengthening in addition to gaze stabilizaiton in sitting. Access Code: [email or web address]    Person(s) Educated Patient    Methods Explanation;Handout;Demonstration    Comprehension Verbalized understanding;Returned demonstration            PT Short Term Goals - 02/22/21 1810      PT SHORT TERM GOAL #1   Title Independent with initial HEP    Time 2    Period Weeks    Status New    Target Date 03/08/21             PT Long Term Goals - 02/22/21 1810      PT LONG TERM GOAL #1   Title Independent with advanced HEP    Time 8    Period Weeks    Status New    Target Date 04/19/21      PT LONG TERM GOAL #2   Title Pt will demonstrate at least 25% improvement in cervical and thoracolumbar ROM with </= 2/10 pain    Time 8    Period Weeks    Status New     Target Date 04/19/21      PT LONG TERM GOAL #3   Title Pt will report improved tolerance to long distance walking with </= 2/10 pain    Time 8    Period Weeks    Status New    Target Date 04/19/21      PT LONG TERM GOAL #4   Title Pt will be able to negotiate stairs with no greater than 1HR using reciprocal pattern, good control, and </= 2/10 pain to facilitate safety and improved toelrance to stairs in the home and community    Time 8    Period Weeks    Status New    Target Date 04/19/21      PT LONG TERM GOAL #5   Title BUE/LE strength at least 4+/5    Time 8    Period Weeks    Status New    Target Date 04/19/21                 Plan - 03/20/21 1147    Clinical Impression Statement Was able to tolerate some exercises today in sitting with resistance bands, moreso limited by mms fatigue. Continues to have alot of tender spots of mms spasm along paraspinals, UT. We discussed progressing vestib exercises at home since she prefers not to do them in clinic d/t symptom exacerbations and gaze stab was added to HEP in addition to band exercises. Trialed DN for the left side of neck today as well. Reassess how pt is feeling in next PT session.    Personal Factors and Comorbidities Comorbidity 2    Stability/Clinical Decision Making Evolving/Moderate complexity    Rehab Potential Fair    PT Frequency 2x / week    PT Duration 8 weeks   6-8 wks   PT Treatment/Interventions ADLs/Self Care Home Management;Cryotherapy;Moist Heat;Stair training;Functional mobility  training;Therapeutic activities;Therapeutic exercise;Balance training;Neuromuscular re-education;Manual techniques;Patient/family education;Taping;Vestibular;Canalith Repostioning;Electrical Stimulation    PT Next Visit Plan Work on slow progression of cervical/thoracolumbar mobility, general strength and flexbility. Lumbopelvic stabilization. vestibular habituation vs compensation, gaze stability if tolerated.    PT Home Exercise  Plan see pt edu    Consulted and Agree with Plan of Care Patient           Patient will benefit from skilled therapeutic intervention in order to improve the following deficits and impairments:  Abnormal gait,Decreased range of motion,Increased muscle spasms,Dizziness,Decreased balance,Decreased mobility,Decreased strength,Impaired flexibility,Hypomobility,Pain  Visit Diagnosis: Cervicalgia  Dizziness and giddiness  Muscle weakness (generalized)  Acute low back pain, unspecified back pain laterality, unspecified whether sciatica present  Pain in thoracic spine     Problem List Patient Active Problem List   Diagnosis Date Noted  . Gastritis with intestinal metaplasia of stomach 01/10/2021  . S/P partial lobectomy of lung 04/12/2020  . Nodule of upper lobe of right lung 04/05/2020  . Bilateral breast cysts 12/27/2019  . Malignant neoplasm of upper lobe of right lung ([place]) 10/12/2019  . Sensorineural hearing loss (SNHL) of both ears 06/20/2019  . Adenocarcinoma of lung, stage 1, right ([place]) 05/26/2019  . Tinnitus of both ears 05/12/2019  . B12 deficiency 04/11/2019  . Cyst of ovary 04/01/2019  . Osteopenia 04/01/2019  . Vitamin D deficiency 05/24/2015  . 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 Cyst of kidney, acquired 12/20/2014  . Interstitial cystitis (chronic) without hematuria 03/20/2012  . Bilateral fibrocystic breast disease (FCBD) 03/20/2012  . Hypothyroidism, followed by Dr. [name] 08/16/2008  . Hyperlipidemia, LDL 194, HDL 62 08/16/2008  . Shortness of breath 08/16/2008  . Nontoxic multinodular goiter 08/08/2007    Hall Busing, PT, DPT 03/20/2021, 11:57 AM  [place]. [place], [place], [address] Phone: [phone]   Fax:  [phone]  Name: Jocelyn Parker MRN: [id number] Date of Birth: Feb 20, 1951

## 2021-03-21 ENCOUNTER — Ambulatory Visit: Payer: Medicare Other | Admitting: Family Medicine

## 2021-03-22 ENCOUNTER — Other Ambulatory Visit: Payer: Self-pay

## 2021-03-22 ENCOUNTER — Ambulatory Visit: Payer: Medicare Other

## 2021-03-22 DIAGNOSIS — M6281 Muscle weakness (generalized): Secondary | ICD-10-CM

## 2021-03-22 DIAGNOSIS — M542 Cervicalgia: Secondary | ICD-10-CM | POA: Diagnosis not present

## 2021-03-22 DIAGNOSIS — M545 Low back pain, unspecified: Secondary | ICD-10-CM

## 2021-03-22 DIAGNOSIS — M546 Pain in thoracic spine: Secondary | ICD-10-CM

## 2021-03-22 DIAGNOSIS — R42 Dizziness and giddiness: Secondary | ICD-10-CM

## 2021-03-22 NOTE — Therapy (Signed)
[place]. [place], [place], [address] Phone: [phone]   Fax:  [phone]  Physical Therapy Treatment  Patient Details  Name: Jocelyn Parker MRN: [id number] Date of Birth: 07/17/1951 Referring Provider (PT): Marikay Alar Date: 03/22/2021   PT End of Session - 03/22/21 1625    Visit Number 9    Number of Visits 17    Date for PT Re-Evaluation 04/19/21    PT Start Time 6962    PT Stop Time 1700    PT Time Calculation (min) 43 min    Activity Tolerance Patient tolerated treatment well;Patient limited by pain    Behavior During Therapy [facility] for tasks assessed/performed           Past Medical History:  Diagnosis Date  . Allergy    sulfa  . Chronic interstitial cystitis   . Lung cancer ([place])    per patient   . Thyroid disease     Past Surgical History:  Procedure Laterality Date  . ABDOMINAL HYSTERECTOMY    . lumps removed from breast     1970's ,1990's and in 2000's  nono cancer   . LUNG SURGERY  04/05/2020    There were no vitals filed for this visit.   Subjective Assessment - 03/22/21 1624    Subjective Seeing Dr Rogers Blocker Friday. Feeling a little better in the back    Patient Stated Goals to decrease pain, get back to where she was with walking 4 mi/day a few days a week, be able to sit and sand for longer times without pain.    Currently in Pain? Yes    Pain Score 4     Pain Location Neck    Pain Onset More than a month ago    Pain Score 3    Pain Location Back    Pain Orientation Left                             [facility] Adult PT Treatment/Exercise - 03/22/21 0001      Neck Exercises: Machines for Strengthening   Nustep L3 x  6 minutes      Neck Exercises: Seated   Other Seated Exercise yellow TB: rows x 10, B ER x 10, horizontal ABD x 10      Lumbar Exercises: Seated   Long Arc Quad on Chair Strengthening;Both;2 sets;10 reps    LAQ on Chair Weights (lbs) 1#     Other Seated Lumbar Exercises seated small ball ab iso 5" 10 x 2. Seated hip ADD with ball 10 x 3. seated hip ABD red TB 10 x 3. short range marches 2x10 alt B 1# AWs.    Other Seated Lumbar Exercises yellow TB HS curl 10 x 2 B      Modalities   Modalities Moist Heat      Moist Heat Therapy   Number Minutes Moist Heat 6 Minutes    Moist Heat Location --   left UT, mid back     Manual Therapy   Manual Therapy Soft tissue mobilization    Manual therapy comments STM and MFR to lumbar paraspinals and QL, periscap mms, UT/LS on the left.    Soft tissue mobilization to the left upper trap, some gentle upper trap and levator stretch              Vestib: VOR x 5 horizontal. Saccades x 5.  PT Short Term Goals - 03/22/21 1626      PT SHORT TERM GOAL #1   Title Independent with initial HEP    Time 2    Period Weeks    Status Achieved    Target Date 03/08/21             PT Long Term Goals - 02/22/21 1810      PT LONG TERM GOAL #1   Title Independent with advanced HEP    Time 8    Period Weeks    Status New    Target Date 04/19/21      PT LONG TERM GOAL #2   Title Pt will demonstrate at least 25% improvement in cervical and thoracolumbar ROM with </= 2/10 pain    Time 8    Period Weeks    Status New    Target Date 04/19/21      PT LONG TERM GOAL #3   Title Pt will report improved tolerance to long distance walking with </= 2/10 pain    Time 8    Period Weeks    Status New    Target Date 04/19/21      PT LONG TERM GOAL #4   Title Pt will be able to negotiate stairs with no greater than 1HR using reciprocal pattern, good control, and </= 2/10 pain to facilitate safety and improved toelrance to stairs in the home and community    Time 8    Period Weeks    Status New    Target Date 04/19/21      PT LONG TERM GOAL #5   Title BUE/LE strength at least 4+/5    Time 8    Period Weeks    Status New    Target Date 04/19/21                 Plan -  03/22/21 1657    Clinical Impression Statement reports LBP a bit better today, doing more of the strength exercises and vestib exercises at home. Continued difficulty with vestib exercises. Was able to complete some gaze stab exercises in session today with symptom exacerbation and low tolerance but increased willingness. Tolerated progress of strengthening exercises well.    Personal Factors and Comorbidities Comorbidity 2    Stability/Clinical Decision Making Evolving/Moderate complexity    Rehab Potential Fair    PT Frequency 2x / week    PT Duration 8 weeks   6-8 wks   PT Treatment/Interventions ADLs/Self Care Home Management;Cryotherapy;Moist Heat;Stair training;Functional mobility training;Therapeutic activities;Therapeutic exercise;Balance training;Neuromuscular re-education;Manual techniques;Patient/family education;Taping;Vestibular;Canalith Repostioning;Electrical Stimulation    PT Next Visit Plan Work on slow progression of cervical/thoracolumbar mobility, general strength and flexbility. Lumbopelvic stabilization. vestibular habituation vs compensation, gaze stability if tolerated.    PT Home Exercise Plan see pt edu    Consulted and Agree with Plan of Care Patient           Patient will benefit from skilled therapeutic intervention in order to improve the following deficits and impairments:  Abnormal gait,Decreased range of motion,Increased muscle spasms,Dizziness,Decreased balance,Decreased mobility,Decreased strength,Impaired flexibility,Hypomobility,Pain  Visit Diagnosis: Cervicalgia  Dizziness and giddiness  Muscle weakness (generalized)  Acute low back pain, unspecified back pain laterality, unspecified whether sciatica present  Pain in thoracic spine     Problem List Patient Active Problem List   Diagnosis Date Noted  . Gastritis with intestinal metaplasia of stomach 01/10/2021  . S/P partial lobectomy of lung 04/12/2020  . Nodule of upper lobe of right  lung  04/05/2020  . Bilateral breast cysts 12/27/2019  . Malignant neoplasm of upper lobe of right lung ([place]) 10/12/2019  . Sensorineural hearing loss (SNHL) of both ears 06/20/2019  . Adenocarcinoma of lung, stage 1, right ([place]) 05/26/2019  . Tinnitus of both ears 05/12/2019  . B12 deficiency 04/11/2019  . Cyst of ovary 04/01/2019  . Osteopenia 04/01/2019  . Vitamin D deficiency 05/24/2015  . 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 Cyst of kidney, acquired 12/20/2014  . Interstitial cystitis (chronic) without hematuria 03/20/2012  . Bilateral fibrocystic breast disease (FCBD) 03/20/2012  . Hypothyroidism, followed by Dr. [name] 08/16/2008  . Hyperlipidemia, LDL 194, HDL 62 08/16/2008  . Shortness of breath 08/16/2008  . Nontoxic multinodular goiter 08/08/2007    Hall Busing , PT, DPT 03/22/2021, 5:07 PM  [place]. [place], [place], [address] Phone: [phone]   Fax:  [phone]  Name: Jocelyn Parker MRN: [id number] Date of Birth: 05-12-1951

## 2021-03-23 ENCOUNTER — Encounter: Payer: Self-pay | Admitting: Family Medicine

## 2021-03-23 ENCOUNTER — Ambulatory Visit (INDEPENDENT_AMBULATORY_CARE_PROVIDER_SITE_OTHER): Payer: Medicare Other | Admitting: Family Medicine

## 2021-03-23 ENCOUNTER — Ambulatory Visit (INDEPENDENT_AMBULATORY_CARE_PROVIDER_SITE_OTHER): Payer: Medicare Other

## 2021-03-23 VITALS — BP 98/64 | HR 81 | Temp 98.1°F | Ht 63.0 in | Wt 116.6 lb

## 2021-03-23 DIAGNOSIS — E559 Vitamin D deficiency, unspecified: Secondary | ICD-10-CM

## 2021-03-23 DIAGNOSIS — M546 Pain in thoracic spine: Secondary | ICD-10-CM

## 2021-03-23 DIAGNOSIS — R42 Dizziness and giddiness: Secondary | ICD-10-CM | POA: Diagnosis not present

## 2021-03-23 DIAGNOSIS — L659 Nonscarring hair loss, unspecified: Secondary | ICD-10-CM

## 2021-03-23 LAB — T4, FREE: Free T4: 1.03 ng/dL (ref 0.60–1.60)

## 2021-03-23 LAB — CK: Total CK: 95 U/L (ref 7–177)

## 2021-03-23 LAB — TSH: TSH: 0.48 u[IU]/mL (ref 0.35–4.50)

## 2021-03-23 MED ORDER — CYANOCOBALAMIN 1000 MCG/ML IJ SOLN: 1000.0000 ug | INTRAMUSCULAR | 3 refills | Status: DC

## 2021-03-23 MED ORDER — MECLIZINE HCL 12.5 MG PO TABS: ORAL_TABLET | ORAL | 1 refills | Status: DC

## 2021-03-23 MED ORDER — ERGOCALCIFEROL 1.25 MG (50000 UT) PO CAPS: 50000.0000 [IU] | ORAL_CAPSULE | ORAL | 1 refills | Status: DC

## 2021-03-23 NOTE — Patient Instructions (Addendum)
-  xrays for back today -repeat thyroid labs -refilled medication for you!  I will miss you! i'll think on the hair color change as well!  Thanks for letting me take care of you! Aw
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
## 2021-03-25 ENCOUNTER — Encounter: Payer: Self-pay | Admitting: Dermatology

## 2021-03-25 DIAGNOSIS — R42 Dizziness and giddiness: Secondary | ICD-10-CM | POA: Insufficient documentation

## 2021-03-25 NOTE — Progress Notes (Signed)
   New Patient   Subjective  Jocelyn Parker is a 70 y.o. female who presents for the following: New Patient (Initial Visit) (Patient here today for lesion on her right inner check x unsure, per patient she would like it removed.  Patient would also like to discuss the redness in both cheeks per patient she's had fillers put in and she started noticing the redness after that.  Per patient she has lung cancer surgery last June. ).  Several issues to discuss today. Location:  Duration:  Quality:  Associated Signs/Symptoms: Modifying Factors:  Severity:  Timing: Context:    The following portions of the chart were reviewed this encounter and updated as appropriate:  Tobacco  Allergies  Meds  Problems  Med Hx  Surg Hx  Fam Hx      Objective  Well appearing patient in no apparent distress; mood and affect are within normal limits. Objective  Chest - Medial (Center): WAIST UP EXAM: No atypical pigmented lesions or nonmobile skin cancer.  Objective  Left Temple: Briefly discussed treatment options for rhytids  Objective  Right Medial [place]: INNER RIGHT EYE: Half millimeter superficial dermal hard white papule  Objective  Scalp: SCALP DENSITY CHECKED BY DR Denna Haggard (POSTIVE)   Objective  Mid [place] Lip: PATIENT STATED SHE HAD FILLERS IN HER LIPS AND NOTICED REDNESS: This dermal redness with subtle feel is likely a reaction to her filler    All skin waist up examined.  Pes planus undergarments not fully examined.   Assessment & Plan  Screening exam for skin cancer Chest - [facility])  Annual skin examination.  Rhytides Left Temple  No intervention initiated  [place] Right Medial [place]  May choose to schedule future removal.  Female pattern alopecia Scalp  OVER THE COUNTER ROGAINE 5% TOPICAL AMY MCMICHAEL 1.5 YEAR FOR VISIT PUNCH BIOPSY DISCUSSED  Irritant dermatitis Mid [place] Lip  PATIENT WILL FOLLOW UP WITH DR [facility] DID  TREATMENT

## 2021-03-27 ENCOUNTER — Ambulatory Visit: Payer: Medicare Other

## 2021-03-27 ENCOUNTER — Other Ambulatory Visit: Payer: Self-pay

## 2021-03-27 DIAGNOSIS — M545 Low back pain, unspecified: Secondary | ICD-10-CM

## 2021-03-27 DIAGNOSIS — M546 Pain in thoracic spine: Secondary | ICD-10-CM

## 2021-03-27 DIAGNOSIS — M6281 Muscle weakness (generalized): Secondary | ICD-10-CM

## 2021-03-27 DIAGNOSIS — M542 Cervicalgia: Secondary | ICD-10-CM

## 2021-03-27 DIAGNOSIS — R42 Dizziness and giddiness: Secondary | ICD-10-CM

## 2021-03-27 NOTE — Therapy (Signed)
[place] [place], [place], [address] Phone: [phone]   Fax:  [phone]  Physical Therapy Treatment Progress Note Reporting Period 02/22/21 to 03/27/21  See note below for Objective Data and Assessment of Progress/Goals.      Patient Details  Name: Jocelyn Parker MRN: [id number] Date of Birth: November 08, 1950 Referring Provider (PT): Marikay Alar Date: 03/27/2021   PT End of Session - 03/27/21 1025    Visit Number 10    Number of Visits 17    Date for PT Re-Evaluation 04/19/21    PT Start Time 1018    PT Stop Time 1105    PT Time Calculation (min) 47 min    Activity Tolerance Patient tolerated treatment well;Patient limited by pain    Behavior During Therapy [facility] for tasks assessed/performed           Past Medical History:  Diagnosis Date  . Allergy    sulfa  . Chronic interstitial cystitis   . Lung cancer ([place])    per patient   . Thyroid disease     Past Surgical History:  Procedure Laterality Date  . ABDOMINAL HYSTERECTOMY    . lumps removed from breast     1970's ,1990's and in 2000's  nono cancer   . LUNG SURGERY  04/05/2020    There were no vitals filed for this visit.   Subjective Assessment - 03/27/21 1022    Subjective Same annoying slight headache. Saw dr Rogers Blocker on Friday and had xrays done of ribs, might do MRI/CT scan.  alittle pain moreso at the right ribcage, not much low back pain today. Seeing oncologist thursday    Diagnostic tests 03/23/2021: thoracic spine Xray: No definite evidence of acute fracture. Mild apparent height loss  and superior endplate irregularity of a few mid upper thoracic  vertebral bodies appears similar to prior thoracic radiographs and  is therefore favored degenerative/secondary to obliquity in the  setting of mild scoliosis. A CT or MRI of the thoracic spine could  provide more sensitive evaluation for acute fracture if clinically  indicated    Patient  Stated Goals to decrease pain, get back to where she was with walking 4 mi/day a few days a week, be able to sit and sand for longer times without pain.    Currently in Pain? Yes    Pain Score 4     Pain Location Thoracic    Pain Onset More than a month ago              [facility] PT Assessment - 03/27/21 0001      Strength   Overall Strength Comments 4+/5 B knees (LBP with knee flex resited)                         [facility] Adult PT Treatment/Exercise - 03/27/21 0001      Neck Exercises: Machines for Strengthening   Nustep L4 x 6 minutes    Cybex Row 10# 10x2    Lat Pull 10# 10x active assist provided by PT     Lumbar Exercises: Seated   Long Arc Quad on Chair Strengthening;Both;2 sets;10 reps    LAQ on Chair Weights (lbs) 1.5    Other Seated Lumbar Exercises Seated hip ADD with ball 10 x 3. seated hip ABD red TB 10 x 3. short range marches 2x10 alt B 1.5# AWs.    Other Seated Lumbar Exercises yellow  TB HS curl 10 x 2 B      Modalities   Modalities Moist Heat to left UT and back x 8 min                   PT Short Term Goals - 03/22/21 1626      PT SHORT TERM GOAL #1   Title Independent with initial HEP    Time 2    Period Weeks    Status Achieved    Target Date 03/08/21             PT Long Term Goals - 03/27/21 1029      PT LONG TERM GOAL #1   Title Independent with advanced HEP    Time 8    Period Weeks    Status On-going      PT LONG TERM GOAL #2   Title Pt will demonstrate at least 25% improvement in cervical and thoracolumbar ROM with </= 2/10 pain    Time 8    Period Weeks    Status On-going      PT LONG TERM GOAL #3   Title Pt will report improved tolerance to long distance walking with </= 2/10 pain    Time 8    Period Weeks    Status On-going   going outside to walk now, but pain can get higher as she goes from a 4-7/10 and will stop     PT LONG TERM GOAL #4   Title Pt will be able to negotiate stairs with no greater than  1HR using reciprocal pattern, good control, and </= 2/10 pain to facilitate safety and improved toelrance to stairs in the home and community    Time 8    Period Weeks    Status On-going   continues to guard self, holding onto railing, 1 at a time, incr pain     PT LONG TERM GOAL #5   Title BUE/LE strength at least 4+/5    Time 8    Period Weeks    Status Partially Met                 Plan - 03/27/21 1052    Clinical Impression Statement Pt is making gradual progress towards goals. Functionally and as demonstrated by score of 36% on FOTO still limited with neck/back mobility. However, she does report she feels like dizziness has gotten a little bit better, feels like band exercises have been helping. Overall functional mobility gradually improving but continues to be very guarded with movements. Alot of cueing required to relax shoulders with exercises. She does report she continues to get some tingling along left side that still happens occasionally but not constantly, unable to pinpoint what exacerbates it. She will benefit from continued skilled PT to address postural and extremity mms weakness, headaches, vestibular function, pain.    Personal Factors and Comorbidities Comorbidity 2    Stability/Clinical Decision Making Evolving/Moderate complexity    Rehab Potential Fair    PT Frequency 2x / week    PT Duration 8 weeks   6-8 wks   PT Treatment/Interventions ADLs/Self Care Home Management;Cryotherapy;Moist Heat;Stair training;Functional mobility training;Therapeutic activities;Therapeutic exercise;Balance training;Neuromuscular re-education;Manual techniques;Patient/family education;Taping;Vestibular;Canalith Repostioning;Electrical Stimulation    PT Next Visit Plan Work on slow progression of cervical/thoracolumbar mobility, general strength and flexbility. Lumbopelvic stabilization. vestibular habituation vs compensation, gaze stability if tolerated.    PT Home Exercise Plan see pt  edu    Consulted and Agree with Plan of  Care Patient           Patient will benefit from skilled therapeutic intervention in order to improve the following deficits and impairments:  Abnormal gait,Decreased range of motion,Increased muscle spasms,Dizziness,Decreased balance,Decreased mobility,Decreased strength,Impaired flexibility,Hypomobility,Pain  Visit Diagnosis: Cervicalgia  Dizziness and giddiness  Muscle weakness (generalized)  Acute low back pain, unspecified back pain laterality, unspecified whether sciatica present  Pain in thoracic spine     Problem List Patient Active Problem List   Diagnosis Date Noted  . Vertigo 03/25/2021  . Gastritis with intestinal metaplasia of stomach 01/10/2021  . S/P partial lobectomy of lung 04/12/2020  . Nodule of upper lobe of right lung 04/05/2020  . Bilateral breast cysts 12/27/2019  . Malignant neoplasm of upper lobe of right lung ([employer]) 10/12/2019  . Sensorineural hearing loss (SNHL) of both ears 06/20/2019  . Adenocarcinoma of lung, stage 1, right ([place]) 05/26/2019  . Tinnitus of both ears 05/12/2019  . B12 deficiency 04/11/2019  . Cyst of ovary 04/01/2019  . Osteopenia 04/01/2019  . Vitamin D deficiency 05/24/2015  . 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 Cyst of kidney, acquired 12/20/2014  . Interstitial cystitis (chronic) without hematuria 03/20/2012  . Bilateral fibrocystic breast disease (FCBD) 03/20/2012  . Hypothyroidism, followed by Dr. [name] 08/16/2008  . Hyperlipidemia, LDL 194, HDL 62 08/16/2008  . Shortness of breath 08/16/2008  . Nontoxic multinodular goiter 08/08/2007    Hall Busing, PT, DPT 03/27/2021, 11:06 AM  [address]. [place], [place], [address] Phone: [phone]   Fax:  [phone]  Name: Donnita Farina MRN: [id number] Date of Birth: 1951/02/20

## 2021-03-29 DIAGNOSIS — S299XXD Unspecified injury of thorax, subsequent encounter: Secondary | ICD-10-CM | POA: Insufficient documentation
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
## 2021-04-04 ENCOUNTER — Ambulatory Visit: Payer: Medicare Other | Admitting: Family Medicine

## 2021-04-17 ENCOUNTER — Ambulatory Visit: Payer: No Typology Code available for payment source

## 2021-04-19 ENCOUNTER — Ambulatory Visit: Payer: No Typology Code available for payment source
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
## 2021-04-25 ENCOUNTER — Ambulatory Visit: Payer: No Typology Code available for payment source | Attending: Family Medicine

## 2021-04-25 ENCOUNTER — Encounter: Payer: Self-pay | Admitting: Family Medicine

## 2021-04-25 ENCOUNTER — Ambulatory Visit (INDEPENDENT_AMBULATORY_CARE_PROVIDER_SITE_OTHER): Payer: Medicare Other | Admitting: Family Medicine

## 2021-04-25 ENCOUNTER — Other Ambulatory Visit: Payer: Self-pay

## 2021-04-25 VITALS — BP 118/60 | HR 80 | Ht 63.0 in | Wt 114.0 lb

## 2021-04-25 DIAGNOSIS — M545 Low back pain, unspecified: Secondary | ICD-10-CM | POA: Diagnosis present

## 2021-04-25 DIAGNOSIS — M7918 Myalgia, other site: Secondary | ICD-10-CM

## 2021-04-25 DIAGNOSIS — R42 Dizziness and giddiness: Secondary | ICD-10-CM

## 2021-04-25 DIAGNOSIS — M6281 Muscle weakness (generalized): Secondary | ICD-10-CM | POA: Diagnosis present

## 2021-04-25 DIAGNOSIS — M546 Pain in thoracic spine: Secondary | ICD-10-CM

## 2021-04-25 DIAGNOSIS — M542 Cervicalgia: Secondary | ICD-10-CM | POA: Diagnosis not present

## 2021-04-25 NOTE — Therapy (Signed)
[facility]. [place], [place], [address] Phone: [phone]   Fax:  [phone]  Physical Therapy Treatment/Recertification  Patient Details  Name: Jocelyn Parker MRN: [id number] Date of Birth: [birth year] Referring Provider (PT): Marikay Alar Date: 04/25/2021   PT End of Session - 04/25/21 1155     Visit Number 11    Number of Visits 17    Date for PT Re-Evaluation 05/25/21    PT Start Time 1133    PT Stop Time 1215    PT Time Calculation (min) 42 min    Activity Tolerance Patient tolerated treatment well;Patient limited by pain    Behavior During Therapy [facility] for tasks assessed/performed             Past Medical History:  Diagnosis Date   Allergy    sulfa   Chronic interstitial cystitis    Lung cancer ([place])    per patient    Thyroid disease     Past Surgical History:  Procedure Laterality Date   ABDOMINAL HYSTERECTOMY     lumps removed from breast     1970's ,1990's and in 2000's  nono cancer    LUNG SURGERY  04/05/2020    There were no vitals filed for this visit.   Subjective Assessment - 04/25/21 1134     Subjective Saw Dr hill and was given injections in the back, neck and by the ribcage. Got alot of relief from injjections but still there. Dizziness not all the time but still there. neck and around ribcage, occasional lower back giving problems time to time. Dr Georgina Snell toiday as well    Diagnostic tests 03/23/2021: thoracic spine Xray: No definite evidence of acute fracture. Mild apparent height loss  and superior endplate irregularity of a few mid upper thoracic  vertebral bodies appears similar to prior thoracic radiographs and  is therefore favored degenerative/secondary to obliquity in the  setting of mild scoliosis. A CT or MRI of the thoracic spine could  provide more sensitive evaluation for acute fracture if clinically  indicated    Patient Stated Goals to decrease pain, get back to  where she was with walking 4 mi/day a few days a week, be able to sit and sand for longer times without pain.    Currently in Pain? Yes    Pain Score 3     Pain Location Thoracic    Pain Onset More than a month ago                [facility] PT Assessment - 04/25/21 0001       Assessment   Medical Diagnosis M79.18 (ICD-10-CM) - Pain of rhomboid muscle  M54.50 (ICD-10-CM) - Pain, lumbar region    Referring Provider (PT) Georgina Snell    Onset Date/Surgical Date 01/18/21      AROM   Cervical Flexion WFL    Cervical Extension WFL    Cervical - Right Rotation increased to approximately 45    Cervical - Left Rotation increased to approximately 45      Strength   Overall Strength Comments 4+/5 B knees (LBP with knee flex resited)                           [facility] Adult PT Treatment/Exercise - 04/25/21 0001       Neck Exercises: Seated   Other Seated Exercise yellow TB: standing rows 2 x 10, B ER x  10, horizontal ABD x 10, x 5, x 5. OHP 5 x 2 with red med ball. within tolerated range      Manual Therapy   Manual Therapy Soft tissue mobilization    Manual therapy comments STM and MFR to cervical paraspinals periscap mms, UT/LS on the left.    Soft tissue mobilization gentle paraspinal stretching into forward flexion, UT stretching Bilateral. A lot of tender areas in lower cervical spine on right side.              MHP x 4 minutes  to lower c/s end of session while reviewing additions to HEP      PT Education - 04/25/21 1631     Education Details added cervical ROM exercises to HEP              PT Short Term Goals - 03/22/21 1626       PT SHORT TERM GOAL #1   Title Independent with initial HEP    Time 2    Period Weeks    Status Achieved    Target Date 03/08/21               PT Long Term Goals - 04/25/21 1139       PT LONG TERM GOAL #1   Title Independent with advanced HEP    Time 8    Period Weeks    Status On-going      PT LONG TERM  GOAL #2   Title Pt will demonstrate at least 25% improvement in cervical and thoracolumbar ROM with </= 2/10 pain    Time 8    Period Weeks    Status On-going      PT LONG TERM GOAL #3   Title Pt will report improved tolerance to long distance walking with </= 2/10 pain    Time 8    Period Weeks    Status On-going   can walk 1/2 mile to a mile limited by fatigue     PT LONG TERM GOAL #4   Title Pt will be able to negotiate stairs with no greater than 1HR using reciprocal pattern, good control, and </= 2/10 pain to facilitate safety and improved toelrance to stairs in the home and community    Time 8    Period Weeks    Status On-going   continues to guard self, holding onto railing, 1 at a time, incr pain     PT LONG TERM GOAL #5   Title BUE/LE strength at least 4+/5    Time 8    Period Weeks    Status Partially Met                   Plan - 04/25/21 1145     Clinical Impression Statement Pt returns after a 4 week break in PT d/t scheduling issues and subsequently getting injections at neurologists office.  Pt presents with slight improvement in neck ROM but rotation stil limited with palable mms spasms and TTP along mid to lower cervical spine on the right which limit lateral flexion and rotation ROM.  Low back and thoracic pain persist but have improved slightly since injections. Dizziness also persists but is less than previously was. Eliah will benefit from skilled PT to address general mms weakness, decreased flexiiblity, decreased cervical ROM. She is seeing Dr Georgina Snell today and plan to modify POC if needed based on any further findings of this appointment.    Personal Factors and Comorbidities Comorbidity  2    Stability/Clinical Decision Making Evolving/Moderate complexity    Rehab Potential Fair    PT Frequency 2x / week    PT Duration 4 weeks    PT Treatment/Interventions ADLs/Self Care Home Management;Cryotherapy;Moist Heat;Stair training;Functional mobility  training;Therapeutic activities;Therapeutic exercise;Balance training;Neuromuscular re-education;Manual techniques;Patient/family education;Taping;Vestibular;Canalith Repostioning;Electrical Stimulation    PT Next Visit Plan Work on slow progression of cervical/thoracolumbar mobility, general strength and flexbility. Lumbopelvic stabilization. vestibular habituation vs compensation, gaze stability if tolerated.    PT Home Exercise Plan added supine cervical rotation AROM and gentle chin tucks in supine    Consulted and Agree with Plan of Care Patient             Patient will benefit from skilled therapeutic intervention in order to improve the following deficits and impairments:  Abnormal gait, Decreased range of motion, Increased muscle spasms, Dizziness, Decreased balance, Decreased mobility, Decreased strength, Impaired flexibility, Hypomobility, Pain  Visit Diagnosis: Cervicalgia - Plan: PT plan of care cert/re-cert  Dizziness and giddiness - Plan: PT plan of care cert/re-cert  Muscle weakness (generalized) - Plan: PT plan of care cert/re-cert  Acute low back pain, unspecified back pain laterality, unspecified whether sciatica present - Plan: PT plan of care cert/re-cert  Pain in thoracic spine - Plan: PT plan of care cert/re-cert     Problem List Patient Active Problem List   Diagnosis Date Noted   Vertigo 03/25/2021   Gastritis with intestinal metaplasia of stomach 01/10/2021   S/P partial lobectomy of lung 04/12/2020   Nodule of upper lobe of right lung 04/05/2020   Bilateral breast cysts 12/27/2019   Malignant neoplasm of upper lobe of right lung (HCC) 10/12/2019   Sensorineural hearing loss (SNHL) of both ears 06/20/2019   Adenocarcinoma of lung, stage 1, right (HCC) 05/26/2019   Tinnitus of both ears 05/12/2019   B12 deficiency 04/11/2019   Cyst of ovary 04/01/2019   Osteopenia 04/01/2019   Vitamin D deficiency 05/24/2015   Xanthoma of eyelid 12/21/2014   Cyst of  kidney, acquired 12/20/2014   Interstitial cystitis (chronic) without hematuria 03/20/2012   Bilateral fibrocystic breast disease (FCBD) 03/20/2012   Hypothyroidism, followed by Dr. [name] 08/16/2008   Hyperlipidemia, LDL 194, HDL 62 08/16/2008   Shortness of breath 08/16/2008   Nontoxic multinodular goiter 08/08/2007    Hall Busing, PT, DPT 04/25/2021, 4:39 PM  [place]. [place], [place], [address] Phone: [phone]   Fax:  [phone]  Name: Adonis Ryther MRN: [id number] Date of Birth: August 19, 1951

## 2021-04-25 NOTE — Patient Instructions (Signed)
Thank you for coming in today.   Plan to continue a little more PT.   Next step if you do not improve would be MRI of your Cspine and Tspine.  Either recheck in 6 weeks or let me know.  If you know that you will do an MRI let me know and I can just order it.   If you are not sure or have a problem with my office with PT let me know. Mychart is a good to communicate.   I am sorry that I could not see you the other day and I apologies that we did not communicate with you correctly.

## 2021-04-27 ENCOUNTER — Other Ambulatory Visit: Payer: Self-pay

## 2021-04-27 ENCOUNTER — Ambulatory Visit: Payer: No Typology Code available for payment source

## 2021-04-27 DIAGNOSIS — M545 Low back pain, unspecified: Secondary | ICD-10-CM

## 2021-04-27 DIAGNOSIS — R42 Dizziness and giddiness: Secondary | ICD-10-CM

## 2021-04-27 DIAGNOSIS — M546 Pain in thoracic spine: Secondary | ICD-10-CM

## 2021-04-27 DIAGNOSIS — M542 Cervicalgia: Secondary | ICD-10-CM

## 2021-04-27 DIAGNOSIS — M6281 Muscle weakness (generalized): Secondary | ICD-10-CM

## 2021-04-27 NOTE — Therapy (Signed)
[facility]. [place], [place], [address] Phone: [phone]   Fax:  [phone]  Physical Therapy Treatment  Patient Details  Name: Jocelyn Parker MRN: [id number] Date of Birth: May 09, 1951 Referring Provider (PT): Jocelyn Parker   Encounter Date: 04/27/2021   PT End of Session - 04/27/21 1059     Visit Number 12    Number of Visits 17    Date for PT Re-Evaluation 05/25/21    PT Start Time 1018    PT Stop Time 1101    PT Time Calculation (min) 43 min    Activity Tolerance Patient tolerated treatment well;Patient limited by pain    Behavior During Therapy [facility] for tasks assessed/performed             Past Medical History:  Diagnosis Date   Allergy    sulfa   Chronic interstitial cystitis    Lung cancer ([place])    per patient    Thyroid disease     Past Surgical History:  Procedure Laterality Date   ABDOMINAL HYSTERECTOMY     lumps removed from breast     1970's ,1990's and in 2000's  nono cancer    LUNG SURGERY  04/05/2020    There were no vitals filed for this visit.   Subjective Assessment - 04/27/21 1029     Subjective Saw Dr Jocelyn Parker after last visit. Per his note: plan to continue physical therapy services.  If not improved in about 4 to 6 weeks would consider MRI C-spine and T-spine.  She does have a cancer history so MRI is reasonable at that time however the main reason for MRI is for potential injection planning potentially facet or epidural.  Recheck in 6 weeks or following MRI.".    Diagnostic tests 03/23/2021: thoracic spine Xray: No definite evidence of acute fracture. Mild apparent height loss  and superior endplate irregularity of a few mid upper thoracic  vertebral bodies appears similar to prior thoracic radiographs and  is therefore favored degenerative/secondary to obliquity in the  setting of mild scoliosis. A CT or MRI of the thoracic spine could  provide more sensitive evaluation for acute  fracture if clinically  indicated    Patient Stated Goals to decrease pain, get back to where she was with walking 4 mi/day a few days a week, be able to sit and sand for longer times without pain.    Currently in Pain? Yes    Pain Score 3     Pain Location Thoracic   left cervical   Pain Onset More than a month ago                               [facility] Adult PT Treatment/Exercise - 04/27/21 0001       Neck Exercises: Seated   Other Seated Exercise yellow TB: standing rows 2 x 10, B ER 2 x 10, horizontal ABD 2x10, OHP x10 with red med ball  within tolerated range      Lumbar Exercises: Seated   Other Seated Lumbar Exercises short range marches 2x10 .B    Other Seated Lumbar Exercises yellow TB HS curl 10 x 2 B      Modalities   Modalities Moist Heat      Moist Heat Therapy   Number Minutes Moist Heat 5 Minutes    Moist Heat Location --   lower cervical/upper thoracic end of session  Manual Therapy   Manual Therapy Soft tissue mobilization    Manual therapy comments STM and MFR to cervical paraspinals periscap mms, UT/LS on the left.    Soft tissue mobilization gentle paraspinal stretching, UT/LSstretching Bilateral.                      PT Short Term Goals - 03/22/21 1626       PT SHORT TERM GOAL #1   Title Independent with initial HEP    Time 2    Period Weeks    Status Achieved    Target Date 03/08/21               PT Long Term Goals - 04/25/21 1139       PT LONG TERM GOAL #1   Title Independent with advanced HEP    Time 8    Period Weeks    Status On-going      PT LONG TERM GOAL #2   Title Pt will demonstrate at least 25% improvement in cervical and thoracolumbar ROM with </= 2/10 pain    Time 8    Period Weeks    Status On-going      PT LONG TERM GOAL #3   Title Pt will report improved tolerance to long distance walking with </= 2/10 pain    Time 8    Period Weeks    Status On-going   can walk 1/2 mile to a mile  limited by fatigue     PT LONG TERM GOAL #4   Title Pt will be able to negotiate stairs with no greater than 1HR using reciprocal pattern, good control, and </= 2/10 pain to facilitate safety and improved toelrance to stairs in the home and community    Time 8    Period Weeks    Status On-going   continues to guard self, holding onto railing, 1 at a time, incr pain     PT LONG TERM GOAL #5   Title BUE/LE strength at least 4+/5    Time 8    Period Weeks    Status Partially Met                   Plan - 04/27/21 1059     Clinical Impression Statement Pt tolerated interventions well today. Reports feeling some mms soreness after last visit but willing to continue band exercises. We gradually progressed band exercises, some with additional set with short rest between sets as needed. She did report increased UT/LS region burning discomfort with exercises likely from mms fatigue and does demo shoulder elevation with exercises. Some improvement noted with cues provided for shoulder retraction/depression. End of session offered some manual therapy to work on mms extensibility on the neck, multiple tender areas along cervical paraspinals and moreso at scapular insertion point of the left LS. Initially responded well to sustained pressure. Offered MHP end of session  more targeted to upper thoracic/shoulders and she reported nice relief post this.      PT Next Visit Plan Continue to work on slow progression of cervical/thoracolumbar mobility, general strength and flexbility. Lumbopelvic stabilization. Very gentle manual tx, stretching within tolerance.  Pt does get some nice relief with MHP to tender areas and may benefit from this end of session.    PT Home Exercise Plan --    Consulted and Agree with Plan of Care Patient             Patient will benefit  from skilled therapeutic intervention in order to improve the following deficits and impairments:  Abnormal gait, Decreased range of  motion, Increased muscle spasms, Dizziness, Decreased balance, Decreased mobility, Decreased strength, Impaired flexibility, Hypomobility, Pain  Visit Diagnosis: Cervicalgia  Dizziness and giddiness  Acute low back pain, unspecified back pain laterality, unspecified whether sciatica present  Muscle weakness (generalized)  Pain in thoracic spine     Problem List Patient Active Problem List   Diagnosis Date Noted   Vertigo 03/25/2021   Gastritis with intestinal metaplasia of stomach 01/10/2021   S/P partial lobectomy of lung 04/12/2020   Nodule of upper lobe of right lung 04/05/2020   Bilateral breast cysts 12/27/2019   Malignant neoplasm of upper lobe of right lung ([place]) 10/12/2019   Sensorineural hearing loss (SNHL) of both ears 06/20/2019   Adenocarcinoma of lung, stage 1, right ([place]) 05/26/2019   Tinnitus of both ears 05/12/2019   B12 deficiency 04/11/2019   Cyst of ovary 04/01/2019   Osteopenia 04/01/2019   Vitamin D deficiency 05/24/2015   Xanthoma of eyelid 12/21/2014   Cyst of kidney, acquired 12/20/2014   Interstitial cystitis (chronic) without hematuria 03/20/2012   Bilateral fibrocystic breast disease (FCBD) 03/20/2012   Hypothyroidism, followed by Dr. [name] 08/16/2008   Hyperlipidemia, LDL 194, HDL 62 08/16/2008   Shortness of breath 08/16/2008   Nontoxic multinodular goiter 08/08/2007    Hall Busing, PT, DPT 04/27/2021, 11:13 AM  [place]. [place], [place], [address] Phone: [phone]   Fax:  [phone]  Name: Jocelyn Parker MRN: [id number] Date of Birth: 10-16-1951

## 2021-05-01 ENCOUNTER — Encounter: Payer: Self-pay | Admitting: Physical Therapy

## 2021-05-01 ENCOUNTER — Other Ambulatory Visit: Payer: Self-pay

## 2021-05-01 ENCOUNTER — Ambulatory Visit: Payer: No Typology Code available for payment source | Admitting: Physical Therapy

## 2021-05-01 DIAGNOSIS — M6281 Muscle weakness (generalized): Secondary | ICD-10-CM

## 2021-05-01 DIAGNOSIS — M546 Pain in thoracic spine: Secondary | ICD-10-CM

## 2021-05-01 DIAGNOSIS — M542 Cervicalgia: Secondary | ICD-10-CM | POA: Diagnosis not present

## 2021-05-01 DIAGNOSIS — M545 Low back pain, unspecified: Secondary | ICD-10-CM

## 2021-05-01 NOTE — Therapy (Signed)
[facility]. [place], [place], [address] Phone: [phone]   Fax:  [phone]  Physical Therapy Treatment  Patient Details  Name: Jocelyn Parker MRN: [id number] Date of Birth: January 31, 1951 Referring Provider (PT): Jocelyn Parker Date: 05/01/2021   PT End of Session - 05/01/21 1225     Visit Number 13    Number of Visits 17    Date for PT Re-Evaluation 05/25/21    PT Start Time 1150    PT Stop Time 1230    PT Time Calculation (min) 40 min    Activity Tolerance Patient tolerated treatment well    Behavior During Therapy Jocelyn Parker for tasks assessed/performed             Past Medical History:  Diagnosis Date   Allergy    sulfa   Chronic interstitial cystitis    Lung cancer (Jocelyn Parker)    per patient    Thyroid disease     Past Surgical History:  Procedure Laterality Date   ABDOMINAL HYSTERECTOMY     lumps removed from breast     1970's ,1990's and in 2000's  nono cancer    LUNG SURGERY  04/05/2020    There were no vitals filed for this visit.   Subjective Assessment - 05/01/21 1153     Subjective always have pain, thinks she has some blood circulation problem, some numbness I her feet at night, toes turn dark    Currently in Pain? Yes    Pain Score 3     Pain Location Neck                               [facility] Adult PT Treatment/Exercise - 05/01/21 0001       Neck Exercises: Machines for Strengthening   UBE (Upper Arm Bike) L1 x 2.5 min each      Neck Exercises: Theraband   Rows 20 reps;Red    Other Theraband Exercises Shoulder Ext red 2x10    Other Theraband Exercises ER yellow 2x10      Lumbar Exercises: Seated   Long Arc Quad on Chair Strengthening;Both;2 sets;10 reps    Other Seated Lumbar Exercises Marches 2x10    Other Seated Lumbar Exercises red TB HS curl 10 x 2 B      Modalities   Modalities Moist Heat      Moist Heat Therapy   Number Minutes Moist Heat 5  Minutes    Moist Heat Location Cervical                      PT Short Term Goals - 03/22/21 1626       PT SHORT TERM GOAL #1   Title Independent with initial HEP    Time 2    Period Weeks    Status Achieved    Target Date 03/08/21               PT Long Term Goals - 04/25/21 1139       PT LONG TERM GOAL #1   Title Independent with advanced HEP    Time 8    Period Weeks    Status On-going      PT LONG TERM GOAL #2   Title Pt will demonstrate at least 25% improvement in cervical and thoracolumbar ROM with </= 2/10 pain    Time 8    Period Weeks  Status On-going      PT LONG TERM GOAL #3   Title Pt will report improved tolerance to long distance walking with </= 2/10 pain    Time 8    Period Weeks    Status On-going   can walk 1/2 mile to a mile limited by fatigue     PT LONG TERM GOAL #4   Title Pt will be able to negotiate stairs with no greater than 1HR using reciprocal pattern, good control, and </= 2/10 pain to facilitate safety and improved toelrance to stairs in the home and community    Time 8    Period Weeks    Status On-going   continues to guard self, holding onto railing, 1 at a time, incr pain     PT LONG TERM GOAL #5   Title BUE/LE strength at least 4+/5    Time 8    Period Weeks    Status Partially Met                   Plan - 05/01/21 1226     Clinical Impression Statement Pt ~ 5 minutes late today. No issues reported with today's interventions. Increase resistance tolerated with rows, extensions, and seated hamstring curls. Postural cues needed with seated marches and LAQ to prevent posterior trunk lean. Positive response to modality.    Personal Factors and Comorbidities Comorbidity 2    Stability/Clinical Decision Making Evolving/Moderate complexity    Rehab Potential Fair    PT Frequency 2x / week    PT Duration 4 weeks    PT Treatment/Interventions ADLs/Self Care Home Management;Cryotherapy;Moist Heat;Stair  training;Functional mobility training;Therapeutic activities;Therapeutic exercise;Balance training;Neuromuscular re-education;Manual techniques;Patient/family education;Taping;Vestibular;Canalith Repostioning;Electrical Stimulation    PT Next Visit Plan Continue to work on slow progression of cervical/thoracolumbar mobility, general strength and flexbility. Lumbopelvic stabilization. Very gentle manual tx, stretching within tolerance.  Pt does get some nice relief with MHP to tender areas and may benefit from this end of session.             Patient will benefit from skilled therapeutic intervention in order to improve the following deficits and impairments:  Abnormal gait, Decreased range of motion, Increased muscle spasms, Dizziness, Decreased balance, Decreased mobility, Decreased strength, Impaired flexibility, Hypomobility, Pain  Visit Diagnosis: Cervicalgia  Acute low back pain, unspecified back pain laterality, unspecified whether sciatica present  Muscle weakness (generalized)  Pain in thoracic spine     Problem List Patient Active Problem List   Diagnosis Date Noted   Vertigo 03/25/2021   Gastritis with intestinal metaplasia of stomach 01/10/2021   S/P partial lobectomy of lung 04/12/2020   Nodule of upper lobe of right lung 04/05/2020   Bilateral breast cysts 12/27/2019   Malignant neoplasm of upper lobe of right lung ([place]) 10/12/2019   Sensorineural hearing loss (SNHL) of both ears 06/20/2019   Adenocarcinoma of lung, stage 1, right ([place]) 05/26/2019   Tinnitus of both ears 05/12/2019   B12 deficiency 04/11/2019   Cyst of ovary 04/01/2019   Osteopenia 04/01/2019   Vitamin D deficiency 05/24/2015   Xanthoma of eyelid 12/21/2014   Cyst of kidney, acquired 12/20/2014   Interstitial cystitis (chronic) without hematuria 03/20/2012   Bilateral fibrocystic breast disease (FCBD) 03/20/2012   Hypothyroidism, followed by Dr. [name] 08/16/2008   Hyperlipidemia, LDL 194, HDL  62 08/16/2008   Shortness of breath 08/16/2008   Nontoxic multinodular goiter 08/08/2007    Jocelyn Parker 05/01/2021, 12:33 PM  [place]  [place]. [place], [place], [address] Phone: [phone]   Fax:  [phone]  Name: Jocelyn Parker MRN: [id number] Date of Birth: [birth year]

## 2021-05-03 ENCOUNTER — Encounter: Payer: Self-pay | Admitting: Physical Therapy

## 2021-05-03 ENCOUNTER — Other Ambulatory Visit: Payer: Self-pay

## 2021-05-03 ENCOUNTER — Ambulatory Visit: Payer: No Typology Code available for payment source | Admitting: Physical Therapy

## 2021-05-03 DIAGNOSIS — M542 Cervicalgia: Secondary | ICD-10-CM | POA: Diagnosis not present

## 2021-05-03 DIAGNOSIS — M545 Low back pain, unspecified: Secondary | ICD-10-CM

## 2021-05-03 DIAGNOSIS — M6281 Muscle weakness (generalized): Secondary | ICD-10-CM

## 2021-05-03 DIAGNOSIS — M546 Pain in thoracic spine: Secondary | ICD-10-CM

## 2021-05-03 NOTE — Therapy (Signed)
[name]. [place], [place], [address] Phone: [phone]   Fax:  [phone]  Physical Therapy Treatment  Patient Details  Name: Jocelyn Parker MRN: [id number] Date of Birth: 1950/12/06 Referring Provider (PT): Marikay Alar Date: 05/03/2021   PT End of Session - 05/03/21 1013     Visit Number 14    Date for PT Re-Evaluation 05/25/21    PT Start Time 0930    PT Stop Time 1016    PT Time Calculation (min) 46 min    Activity Tolerance Patient tolerated treatment well    Behavior During Therapy [facility] for tasks assessed/performed             Past Medical History:  Diagnosis Date   Allergy    sulfa   Chronic interstitial cystitis    Lung cancer ([place])    per patient    Thyroid disease     Past Surgical History:  Procedure Laterality Date   ABDOMINAL HYSTERECTOMY     lumps removed from breast     1970's ,1990's and in 2000's  nono cancer    LUNG SURGERY  04/05/2020    There were no vitals filed for this visit.   Subjective Assessment - 05/03/21 0932     Subjective Some "midway back pain on R side"    Currently in Pain? Yes    Pain Score 4     Pain Location Back    Pain Orientation Mid;Right                               [facility] Adult PT Treatment/Exercise - 05/03/21 0001       Neck Exercises: Machines for Strengthening   Nustep L4 x 6 minutes      Neck Exercises: Seated   Other Seated Exercise red TB rows 3x10      Lumbar Exercises: Seated   Long Arc Quad on Chair Strengthening;Both;2 sets;10 reps    LAQ on Chair Weights (lbs) 2    Other Seated Lumbar Exercises Marches 2lb 2x10    Other Seated Lumbar Exercises red TB HS curl 2x10      Modalities   Modalities Moist Heat      Moist Heat Therapy   Number Minutes Moist Heat 5 Minutes    Moist Heat Location Cervical;Lumbar Spine                      PT Short Term Goals - 03/22/21 1626       PT  SHORT TERM GOAL #1   Title Independent with initial HEP    Time 2    Period Weeks    Status Achieved    Target Date 03/08/21               PT Long Term Goals - 05/03/21 1018       PT LONG TERM GOAL #2   Title Pt will demonstrate at least 25% improvement in cervical and thoracolumbar ROM with </= 2/10 pain    Status On-going      PT LONG TERM GOAL #3   Title Pt will report improved tolerance to long distance walking with </= 2/10 pain    Status On-going                   Plan - 05/03/21 1019     Clinical Impression Statement Pt did  well overall today. Enters clinic reporting some mid back pain on the R side. Postural cu needed with seated rows. Cues not to lean back with LAQ.  Cue for full ROM needed with HS curls.    Personal Factors and Comorbidities Comorbidity 2    Stability/Clinical Decision Making Evolving/Moderate complexity    Rehab Potential Fair    PT Frequency 2x / week    PT Duration 4 weeks    PT Treatment/Interventions ADLs/Self Care Home Management;Cryotherapy;Moist Heat;Stair training;Functional mobility training;Therapeutic activities;Therapeutic exercise;Balance training;Neuromuscular re-education;Manual techniques;Patient/family education;Taping;Vestibular;Canalith Repostioning;Electrical Stimulation    PT Next Visit Plan Continue to work on slow progression of cervical/thoracolumbar mobility, general strength and flexbility. Lumbopelvic stabilization. Very gentle manual tx, stretching within tolerance.  Pt does get some nice relief with MHP to tender areas and may benefit from this end of session.             Patient will benefit from skilled therapeutic intervention in order to improve the following deficits and impairments:  Abnormal gait, Decreased range of motion, Increased muscle spasms, Dizziness, Decreased balance, Decreased mobility, Decreased strength, Impaired flexibility, Hypomobility, Pain  Visit Diagnosis: Acute low back pain,  unspecified back pain laterality, unspecified whether sciatica present  Muscle weakness (generalized)  Cervicalgia  Pain in thoracic spine     Problem List Patient Active Problem List   Diagnosis Date Noted   Vertigo 03/25/2021   Gastritis with intestinal metaplasia of stomach 01/10/2021   S/P partial lobectomy of lung 04/12/2020   Nodule of upper lobe of right lung 04/05/2020   Bilateral breast cysts 12/27/2019   Malignant neoplasm of upper lobe of right lung ([place]) 10/12/2019   Sensorineural hearing loss (SNHL) of both ears 06/20/2019   Adenocarcinoma of lung, stage 1, right ([place]) 05/26/2019   Tinnitus of both ears 05/12/2019   B12 deficiency 04/11/2019   Cyst of ovary 04/01/2019   Osteopenia 04/01/2019   Vitamin D deficiency 05/24/2015   Xanthoma of eyelid 12/21/2014   Cyst of kidney, acquired 12/20/2014   Interstitial cystitis (chronic) without hematuria 03/20/2012   Bilateral fibrocystic breast disease (FCBD) 03/20/2012   Hypothyroidism, followed by Dr. [name] 08/16/2008   Hyperlipidemia, LDL 194, HDL 62 08/16/2008   Shortness of breath 08/16/2008   Nontoxic multinodular goiter 08/08/2007    [name] 05/03/2021, 10:23 AM  [place]. [place], [place], [address] Phone: [phone]   Fax:  [phone]  Name: Jocelyn Parker MRN: [id number] Date of Birth: 05-13-1951

## 2021-05-09 ENCOUNTER — Ambulatory Visit: Payer: No Typology Code available for payment source

## 2021-05-15 ENCOUNTER — Ambulatory Visit: Payer: No Typology Code available for payment source

## 2021-05-17 ENCOUNTER — Other Ambulatory Visit: Payer: Self-pay

## 2021-05-17 ENCOUNTER — Ambulatory Visit: Payer: No Typology Code available for payment source | Attending: Family Medicine

## 2021-05-17 DIAGNOSIS — R42 Dizziness and giddiness: Secondary | ICD-10-CM

## 2021-05-17 DIAGNOSIS — M542 Cervicalgia: Secondary | ICD-10-CM

## 2021-05-17 DIAGNOSIS — M546 Pain in thoracic spine: Secondary | ICD-10-CM

## 2021-05-17 DIAGNOSIS — M545 Low back pain, unspecified: Secondary | ICD-10-CM | POA: Diagnosis present

## 2021-05-17 DIAGNOSIS — M6281 Muscle weakness (generalized): Secondary | ICD-10-CM

## 2021-05-17 NOTE — Therapy (Signed)
[place]. [place], [place], [address] Phone: [phone]   Fax:  [phone]  Physical Therapy Treatment  Patient Details  Name: Jocelyn Parker MRN: [id number] Date of Birth: 1951/03/11 Referring Provider (PT): Marikay Alar Date: 05/17/2021   PT End of Session - 05/17/21 1051     Visit Number 15    Date for PT Re-Evaluation 05/25/21    PT Start Time 9163    PT Stop Time 1130    PT Time Calculation (min) 43 min    Activity Tolerance Patient tolerated treatment well    Behavior During Therapy [facility] for tasks assessed/performed             Past Medical History:  Diagnosis Date   Allergy    sulfa   Chronic interstitial cystitis    Lung cancer ([place])    per patient    Thyroid disease     Past Surgical History:  Procedure Laterality Date   ABDOMINAL HYSTERECTOMY     lumps removed from breast     1970's ,1990's and in 2000's  nono cancer    LUNG SURGERY  04/05/2020    There were no vitals filed for this visit.   Subjective Assessment - 05/17/21 1050     Subjective midback on the left side bothering most today. Last wednesday had a bad dizzy spell and had to cancel last weeks session.    Currently in Pain? Yes    Pain Score 4     Pain Location Back    Pain Orientation Mid;Left                               [facility] Adult PT Treatment/Exercise - 05/17/21 0001       Neck Exercises: Machines for Strengthening   Nustep L4 x 6 minutes      Neck Exercises: Theraband   Other Theraband Exercises ER red x10      Neck Exercises: Seated   Shoulder Flexion Both;5 reps   2# WaTe bar   Other Seated Exercise red TB rows 3x10      Lumbar Exercises: Seated   Long Arc Quad on Chair Strengthening;Both;2 sets   12 reps   LAQ on Chair Weights (lbs) 2    Other Seated Lumbar Exercises Marches 2lb 2x12    Other Seated Lumbar Exercises red TB HS curl 2x12. seated ab set red ball 2" x 10 x  2 sets      Modalities   Modalities Moist Heat      Moist Heat Therapy   Number Minutes Moist Heat 5 Minutes    Moist Heat Location Cervical;Lumbar Spine                      PT Short Term Goals - 03/22/21 1626       PT SHORT TERM GOAL #1   Title Independent with initial HEP    Time 2    Period Weeks    Status Achieved    Target Date 03/08/21               PT Long Term Goals - 05/03/21 1018       PT LONG TERM GOAL #2   Title Pt will demonstrate at least 25% improvement in cervical and thoracolumbar ROM with </= 2/10 pain    Status On-going      PT LONG  TERM GOAL #3   Title Pt will report improved tolerance to long distance walking with </= 2/10 pain    Status On-going                   Plan - 05/17/21 1137     Clinical Impression Statement Peter [place] overall did well today, seated therex progressed slightly with increase in reps. She was hesitant about progressing to much d/t fear of pain nexacerbation. Still requires cues to prevent posterior trunk lean. Some dizzy exacerbation with standing rows requiring seated rest. Tender areas in the left lower back and near right upper periscap region, heat continues to help calm this slightly end of session.    Personal Factors and Comorbidities Comorbidity 2    Stability/Clinical Decision Making Evolving/Moderate complexity    Rehab Potential Fair    PT Frequency 2x / week    PT Duration 4 weeks    PT Treatment/Interventions ADLs/Self Care Home Management;Cryotherapy;Moist Heat;Stair training;Functional mobility training;Therapeutic activities;Therapeutic exercise;Balance training;Neuromuscular re-education;Manual techniques;Patient/family education;Taping;Vestibular;Canalith Repostioning;Electrical Stimulation    PT Next Visit Plan Continue to work on slow progression of cervical/thoracolumbar mobility, general strength and flexbility. Lumbopelvic stabilization. Very gentle manual tx, stretching within  tolerance.  Pt does get some nice relief with MHP to tender areas and may benefit from this end of session.             Patient will benefit from skilled therapeutic intervention in order to improve the following deficits and impairments:  Abnormal gait, Decreased range of motion, Increased muscle spasms, Dizziness, Decreased balance, Decreased mobility, Decreased strength, Impaired flexibility, Hypomobility, Pain  Visit Diagnosis: Acute low back pain, unspecified back pain laterality, unspecified whether sciatica present  Muscle weakness (generalized)  Cervicalgia  Pain in thoracic spine  Dizziness and giddiness     Problem List Patient Active Problem List   Diagnosis Date Noted   Vertigo 03/25/2021   Gastritis with intestinal metaplasia of stomach 01/10/2021   S/P partial lobectomy of lung 04/12/2020   Nodule of upper lobe of right lung 04/05/2020   Bilateral breast cysts 12/27/2019   Malignant neoplasm of upper lobe of right lung ([place]) 10/12/2019   Sensorineural hearing loss (SNHL) of both ears 06/20/2019   Adenocarcinoma of lung, stage 1, right ([place]) 05/26/2019   Tinnitus of both ears 05/12/2019   B12 deficiency 04/11/2019   Cyst of ovary 04/01/2019   Osteopenia 04/01/2019   Vitamin D deficiency 05/24/2015   Xanthoma of eyelid 12/21/2014   Cyst of kidney, acquired 12/20/2014   Interstitial cystitis (chronic) without hematuria 03/20/2012   Bilateral fibrocystic breast disease (FCBD) 03/20/2012   Hypothyroidism, followed by Dr. [name] 08/16/2008   Hyperlipidemia, LDL 194, HDL 62 08/16/2008   Shortness of breath 08/16/2008   Nontoxic multinodular goiter 08/08/2007    Hall Busing, PT, DPT 05/17/2021, 11:44 AM  [place]. [place], [place], [address] Phone: [phone]   Fax:  [phone]  Name: Iyona Pehrson MRN: [id number] Date of Birth: 02/25/1951

## 2021-05-22 ENCOUNTER — Ambulatory Visit: Payer: No Typology Code available for payment source

## 2021-05-22 ENCOUNTER — Other Ambulatory Visit: Payer: Self-pay

## 2021-05-22 ENCOUNTER — Telehealth: Payer: Self-pay

## 2021-05-22 DIAGNOSIS — M545 Low back pain, unspecified: Secondary | ICD-10-CM

## 2021-05-22 DIAGNOSIS — R42 Dizziness and giddiness: Secondary | ICD-10-CM

## 2021-05-22 DIAGNOSIS — M6281 Muscle weakness (generalized): Secondary | ICD-10-CM

## 2021-05-22 DIAGNOSIS — M542 Cervicalgia: Secondary | ICD-10-CM

## 2021-05-22 DIAGNOSIS — M546 Pain in thoracic spine: Secondary | ICD-10-CM

## 2021-05-22 NOTE — Telephone Encounter (Signed)
Talked to pt that Dr Rogers Blocker has left and she would like to continue her care here with [place]. However, she would like an MD as her primary and she said she would give [place] a call back

## 2021-05-22 NOTE — Therapy (Signed)
[place]. [place], [place], [address] Phone: [phone]   Fax:  [phone]  Physical Therapy Treatment  Patient Details  Name: Jocelyn Parker MRN: [id number] Date of Birth: 03-10-1951 Referring Provider (PT): Marikay Alar Date: 05/22/2021   PT End of Session - 05/22/21 1054     Visit Number 16    Date for PT Re-Evaluation 05/25/21    PT Start Time 1048    PT Stop Time 1127    PT Time Calculation (min) 39 min    Activity Tolerance Patient tolerated treatment well    Behavior During Therapy [facility] for tasks assessed/performed             Past Medical History:  Diagnosis Date   Allergy    sulfa   Chronic interstitial cystitis    Lung cancer ([place])    per patient    Thyroid disease     Past Surgical History:  Procedure Laterality Date   ABDOMINAL HYSTERECTOMY     lumps removed from breast     1970's ,1990's and in 2000's  nono cancer    LUNG SURGERY  04/05/2020    There were no vitals filed for this visit.   Subjective Assessment - 05/22/21 1053     Subjective Mid back down to low bak on the right side bothering more today.    Currently in Pain? Yes    Pain Score 7     Pain Location Back    Pain Orientation Mid;Right                               [facility] Adult PT Treatment/Exercise - 05/22/21 0001       Neck Exercises: Machines for Strengthening   UBE (Upper Arm Bike) L1.5 x 4 min forward, last 2 minutes alternated 30 sconds each fwd/bwd  since consistent bwd more uncomfortable.      Neck Exercises: Theraband   Rows 20 reps;Red    Other Theraband Exercises Shoulder Ext red 2x10    Other Theraband Exercises ER red 2x10      Lumbar Exercises: Seated   Long Arc Quad on Chair Strengthening;Both;2 sets   12 reps   LAQ on Chair Weights (lbs) 2    Other Seated Lumbar Exercises Marches 2lb 2x12    Other Seated Lumbar Exercises red TB HS curl 2x12. seated ab set red  ball 2" x 10 x 2 sets      Modalities   Modalities Moist Heat      Moist Heat Therapy   Number Minutes Moist Heat 5 Minutes    Moist Heat Location Cervical;Lumbar Spine      Manual Therapy   Manual Therapy Soft tissue mobilization    Manual therapy comments STM and MFR to cervical paraspinals periscap mms, UT/LS B. MFR/STM and gentle strumming  to Right sided paraspinals and QL                      PT Short Term Goals - 03/22/21 1626       PT SHORT TERM GOAL #1   Title Independent with initial HEP    Time 2    Period Weeks    Status Achieved    Target Date 03/08/21               PT Long Term Goals - 05/03/21 1018  PT LONG TERM GOAL #2   Title Pt will demonstrate at least 25% improvement in cervical and thoracolumbar ROM with </= 2/10 pain    Status On-going      PT LONG TERM GOAL #3   Title Pt will report improved tolerance to long distance walking with </= 2/10 pain    Status On-going                   Plan - 05/22/21 1107     Clinical Impression Statement Overall continued right sided back pain mid to lower in addition to left UT/neck discomfort requiring breaks and modified ROM with exercises. No dizziness exacerbation with standing exercises today.  with increased mms tension and palpable spasms along the right back session end with some MT to these areas with very slight relief followed by a few minutes MHP. Session ended a few minutes early d/t needing to leave for CT appointment.    Personal Factors and Comorbidities Comorbidity 2    Stability/Clinical Decision Making Evolving/Moderate complexity    Rehab Potential Fair    PT Frequency 2x / week    PT Duration 4 weeks    PT Treatment/Interventions ADLs/Self Care Home Management;Cryotherapy;Moist Heat;Stair training;Functional mobility training;Therapeutic activities;Therapeutic exercise;Balance training;Neuromuscular re-education;Manual techniques;Patient/family  education;Taping;Vestibular;Canalith Repostioning;Electrical Stimulation    PT Next Visit Plan Continue to work on slow progression of cervical/thoracolumbar mobility, general strength and flexbility. Lumbopelvic stabilization. Very gentle manual tx, stretching within tolerance.  Pt does get some nice relief with MHP to tender areas and may benefit from this end of session. - Recert next visit?             Patient will benefit from skilled therapeutic intervention in order to improve the following deficits and impairments:  Abnormal gait, Decreased range of motion, Increased muscle spasms, Dizziness, Decreased balance, Decreased mobility, Decreased strength, Impaired flexibility, Hypomobility, Pain  Visit Diagnosis: Acute low back pain, unspecified back pain laterality, unspecified whether sciatica present  Muscle weakness (generalized)  Cervicalgia  Pain in thoracic spine  Dizziness and giddiness     Problem List Patient Active Problem List   Diagnosis Date Noted   Vertigo 03/25/2021   Gastritis with intestinal metaplasia of stomach 01/10/2021   S/P partial lobectomy of lung 04/12/2020   Nodule of upper lobe of right lung 04/05/2020   Bilateral breast cysts 12/27/2019   Malignant neoplasm of upper lobe of right lung ([place]) 10/12/2019   Sensorineural hearing loss (SNHL) of both ears 06/20/2019   Adenocarcinoma of lung, stage 1, right ([place]) 05/26/2019   Tinnitus of both ears 05/12/2019   B12 deficiency 04/11/2019   Cyst of ovary 04/01/2019   Osteopenia 04/01/2019   Vitamin D deficiency 05/24/2015   Xanthoma of eyelid 12/21/2014   Cyst of kidney, acquired 12/20/2014   Interstitial cystitis (chronic) without hematuria 03/20/2012   Bilateral fibrocystic breast disease (FCBD) 03/20/2012   Hypothyroidism, followed by Dr. [name] 08/16/2008   Hyperlipidemia, LDL 194, HDL 62 08/16/2008   Shortness of breath 08/16/2008   Nontoxic multinodular goiter 08/08/2007    Hall Busing, PT, DPT 05/22/2021, 11:34 AM  [place]. [place], [place], [address] Phone: [phone]   Fax:  [phone]  Name: Jocelyn Parker MRN: [id number] Date of Birth: 15-May-1951

## 2021-05-24 ENCOUNTER — Other Ambulatory Visit: Payer: Self-pay

## 2021-05-24 ENCOUNTER — Ambulatory Visit: Payer: No Typology Code available for payment source

## 2021-05-24 DIAGNOSIS — M546 Pain in thoracic spine: Secondary | ICD-10-CM

## 2021-05-24 DIAGNOSIS — R42 Dizziness and giddiness: Secondary | ICD-10-CM

## 2021-05-24 DIAGNOSIS — M542 Cervicalgia: Secondary | ICD-10-CM

## 2021-05-24 DIAGNOSIS — M545 Low back pain, unspecified: Secondary | ICD-10-CM | POA: Diagnosis not present

## 2021-05-24 DIAGNOSIS — M6281 Muscle weakness (generalized): Secondary | ICD-10-CM

## 2021-05-24 NOTE — Therapy (Signed)
[place] [place], [place], [address] Phone: [phone]   Fax:  [phone]  Physical Therapy Treatment  Patient Details  Name: Jocelyn Parker MRN: [id number] Date of Birth: 05/05/1951 Referring Provider (PT): Marikay Alar Date: 05/24/2021   PT End of Session - 05/24/21 1144     Visit Number 17    Date for PT Re-Evaluation 06/25/21    PT Start Time 1048    PT Stop Time 1140    PT Time Calculation (min) 52 min    Activity Tolerance Patient tolerated treatment well    Behavior During Therapy [facility] for tasks assessed/performed             Past Medical History:  Diagnosis Date   Allergy    sulfa   Chronic interstitial cystitis    Lung cancer ([place])    per patient    Thyroid disease     Past Surgical History:  Procedure Laterality Date   ABDOMINAL HYSTERECTOMY     lumps removed from breast     1970's ,1990's and in 2000's  nono cancer    LUNG SURGERY  04/05/2020    There were no vitals filed for this visit.   Subjective Assessment - 05/24/21 1055     Subjective neck to lower back hurting more today, did do a little more walking not sure if that change in activity bothered it more.    Currently in Pain? Yes    Pain Score 7     Pain Location Back    Pain Orientation Mid;Lower                [facility] PT Assessment - 05/24/21 0001       Assessment   Medical Diagnosis M79.18 (ICD-10-CM) - Pain of rhomboid muscle  M54.50 (ICD-10-CM) - Pain, lumbar region    Referring Provider (PT) Georgina Snell    Next MD Visit 06/06/2021      AROM   Cervical - Right Rotation 55 deg    Cervical - Left Rotation 60 deg wiht pain at right soulder      Strength   Overall Strength Comments 4+/5 B knees (LBP with knee flex and hip flex  resisted)                           [facility] Adult PT Treatment/Exercise - 05/24/21 0001       Neck Exercises: Machines for Strengthening   Nustep L5 x 6 min       Lumbar Exercises: Machines for Strengthening   Other Lumbar Machine Exercise resisted gait 10# x1 forward with incr pain, x 6 bwd no pain, laterall with less pain as forward x 2 reps      Lumbar Exercises: Seated   Other Seated Lumbar Exercises seated ab sets with red pball 2" 10x 2. AR press seated with yellow TB x 10 each side.    Other Seated Lumbar Exercises hip ADD ball squeezes 10 x 2      Modalities   Modalities Moist Heat      Moist Heat Therapy   Number Minutes Moist Heat 8 Minutes    Moist Heat Location Cervical;Lumbar Spine      Manual Therapy   Manual Therapy Soft tissue mobilization    Manual therapy comments STM and MFR to cervical paraspinals periscap mms, UT/LS B. MFR/STM and gentle strumming  to Right sided paraspinals and QL. Gentle traction  with sidebend stretch within pain free ranges, gentle rotaiton stretching                      PT Short Term Goals - 03/22/21 1626       PT SHORT TERM GOAL #1   Title Independent with initial HEP    Time 2    Period Weeks    Status Achieved    Target Date 03/08/21               PT Long Term Goals - 05/24/21 1059       PT LONG TERM GOAL #1   Title Independent with advanced HEP    Status On-going      PT LONG TERM GOAL #2   Title Pt will demonstrate at least 25% improvement in cervical and thoracolumbar ROM with </= 2/10 pain    Status Partially Met   ROM gradually improving but with continued pain 4-8/10 depending on the day     PT LONG TERM GOAL #3   Title Pt will report improved tolerance to long distance walking with </= 2/10 pain    Status Partially Met   still with pain, worse when she stops walking     PT LONG TERM GOAL #4   Title Pt will be able to negotiate stairs with no greater than 1HR using reciprocal pattern, good control, and </= 2/10 pain to facilitate safety and improved toelrance to stairs in the home and community    Status Partially Met   with pain     PT LONG TERM GOAL #5    Title BUE/LE strength at least 4+/5    Status Partially Met   strength limited by back pain                  Plan - 05/24/21 1146     Clinical Impression Statement Overall reports she feels getting better with therapy albeit slowly. Neck ROM improved slightly but still with spasms seemingly near facet joints on the right more than left. She also has intermittent exacerbations of LBP, and this back pain does limit tolerance to some strength exercises. We modified treatment interventions today with increased pain with resisted gait forward, relief of back pain noted with resisted walking backwards. She is scheduled to see Dr Georgina Snell again on 06/06/21. Given gradual  progress she would like to continue with PT to work toward funcitonal improvements. Plan to discuss any needed changes in POC after her follow up appointment with Dr Georgina Snell.    Personal Factors and Comorbidities Comorbidity 2    Stability/Clinical Decision Making Evolving/Moderate complexity    Rehab Potential Fair    PT Frequency 2x / week    PT Duration 4 weeks    PT Treatment/Interventions ADLs/Self Care Home Management;Cryotherapy;Moist Heat;Stair training;Functional mobility training;Therapeutic activities;Therapeutic exercise;Balance training;Neuromuscular re-education;Manual techniques;Patient/family education;Taping;Vestibular;Canalith Repostioning;Electrical Stimulation    PT Next Visit Plan Continue to work on slow progression of cervical/thoracolumbar mobility, strength and flexbility. Lumbopelvic stabilization. Very gentle manual tx, stretching within tolerance.  Pt does get some nice relief with MHP to tender areas and may benefit from this end of session    Consulted and Agree with Plan of Care Patient             Patient will benefit from skilled therapeutic intervention in order to improve the following deficits and impairments:  Abnormal gait, Decreased range of motion, Increased muscle spasms, Dizziness,  Decreased balance, Decreased mobility, Decreased strength, Impaired  flexibility, Hypomobility, Pain  Visit Diagnosis: Acute low back pain, unspecified back pain laterality, unspecified whether sciatica present  Muscle weakness (generalized)  Cervicalgia  Pain in thoracic spine  Dizziness and giddiness     Problem List Patient Active Problem List   Diagnosis Date Noted   Vertigo 03/25/2021   Gastritis with intestinal metaplasia of stomach 01/10/2021   S/P partial lobectomy of lung 04/12/2020   Nodule of upper lobe of right lung 04/05/2020   Bilateral breast cysts 12/27/2019   Malignant neoplasm of upper lobe of right lung ([place]) 10/12/2019   Sensorineural hearing loss (SNHL) of both ears 06/20/2019   Adenocarcinoma of lung, stage 1, right ([place]) 05/26/2019   Tinnitus of both ears 05/12/2019   B12 deficiency 04/11/2019   Cyst of ovary 04/01/2019   Osteopenia 04/01/2019   Vitamin D deficiency 05/24/2015   Xanthoma of eyelid 12/21/2014   Cyst of kidney, acquired 12/20/2014   Interstitial cystitis (chronic) without hematuria 03/20/2012   Bilateral fibrocystic breast disease (FCBD) 03/20/2012   Hypothyroidism, followed by Dr. [name] 08/16/2008   Hyperlipidemia, LDL 194, HDL 62 08/16/2008   Shortness of breath 08/16/2008   Nontoxic multinodular goiter 08/08/2007    Ahmarion Saraceno L Illa Enlow 05/24/2021, 12:30 PM  [place]. [place], [place], [address] Phone: [phone]   Fax:  [phone]  Name: Jocelyn Parker MRN: [id number] Date of Birth: 03/07/1951

## 2021-05-29 ENCOUNTER — Other Ambulatory Visit: Payer: Self-pay

## 2021-05-29 ENCOUNTER — Ambulatory Visit: Payer: No Typology Code available for payment source

## 2021-05-29 DIAGNOSIS — G8918 Other acute postprocedural pain: Secondary | ICD-10-CM | POA: Insufficient documentation

## 2021-05-29 DIAGNOSIS — M546 Pain in thoracic spine: Secondary | ICD-10-CM

## 2021-05-29 DIAGNOSIS — R42 Dizziness and giddiness: Secondary | ICD-10-CM

## 2021-05-29 DIAGNOSIS — M6281 Muscle weakness (generalized): Secondary | ICD-10-CM

## 2021-05-29 DIAGNOSIS — M542 Cervicalgia: Secondary | ICD-10-CM

## 2021-05-29 DIAGNOSIS — M545 Low back pain, unspecified: Secondary | ICD-10-CM | POA: Diagnosis not present

## 2021-05-29 NOTE — Therapy (Signed)
[facility] [place], [place], [address] Phone: [phone]   Fax:  [phone]  Physical Therapy Treatment  Patient Details  Name: Jocelyn Parker MRN: [id number] Date of Birth: 11-Oct-1951 Referring Provider (PT): Marikay Alar Date: 05/29/2021   PT End of Session - 05/29/21 1055     Visit Number 18    Date for PT Re-Evaluation 06/25/21    PT Start Time 1048    PT Stop Time 1135    PT Time Calculation (min) 47 min    Activity Tolerance Patient tolerated treatment well    Behavior During Therapy [facility] for tasks assessed/performed             Past Medical History:  Diagnosis Date   Allergy    sulfa   Chronic interstitial cystitis    Lung cancer ([place])    per patient    Thyroid disease     Past Surgical History:  Procedure Laterality Date   ABDOMINAL HYSTERECTOMY     lumps removed from breast     1970's ,1990's and in 2000's  nono cancer    LUNG SURGERY  04/05/2020    There were no vitals filed for this visit.   Subjective Assessment - 05/29/21 1054     Subjective a little better today. had some more pain last time but heat helped    Currently in Pain? Yes    Pain Score 5     Pain Location Back                               [facility] Adult PT Treatment/Exercise - 05/29/21 0001       Exercises   Exercises Lumbar;Neck;Other Exercises      Neck Exercises: Machines for Strengthening   Nustep L5 x 6 min      Lumbar Exercises: Stretches   Pelvic Tilt 10 reps   seated cat/camel - bothered back so discontinued     Lumbar Exercises: Machines for Strengthening   Other Lumbar Machine Exercise resisted gait 10# x1 forward with incr pain, x 6 bwd no pain, laterall with less pain as forward x 3 reps      Lumbar Exercises: Seated   Sit to Stand Limitations on airex with BUE assist, mat slighlty elevated. 5 x 2    Other Seated Lumbar Exercises seated ab sets with red pball 2" 10x  2. AR press seated with yellow TB x 10 each side.    Other Seated Lumbar Exercises hip ADD ball squeezes 10 x 2      Moist Heat Therapy   Number Minutes Moist Heat 5 Minutes    Moist Heat Location Cervical;Lumbar Spine      Manual Therapy   Manual Therapy Soft tissue mobilization    Manual therapy comments STM and MFR to cervical paraspinals periscap mms, UT/LS B. MFR/STM and gentle strumming  to Right sided paraspinals and QL. Gentle traction with sidebend stretch within pain free ranges, gentle rotaiton stretching                      PT Short Term Goals - 03/22/21 1626       PT SHORT TERM GOAL #1   Title Independent with initial HEP    Time 2    Period Weeks    Status Achieved    Target Date 03/08/21  PT Long Term Goals - 05/24/21 1059       PT LONG TERM GOAL #1   Title Independent with advanced HEP    Status On-going      PT LONG TERM GOAL #2   Title Pt will demonstrate at least 25% improvement in cervical and thoracolumbar ROM with </= 2/10 pain    Status Partially Met   ROM gradually improving but with continued pain 4-8/10 depending on the day     PT LONG TERM GOAL #3   Title Pt will report improved tolerance to long distance walking with </= 2/10 pain    Status Partially Met   still with pain, worse when she stops walking     PT LONG TERM GOAL #4   Title Pt will be able to negotiate stairs with no greater than 1HR using reciprocal pattern, good control, and </= 2/10 pain to facilitate safety and improved toelrance to stairs in the home and community    Status Partially Met   with pain     PT LONG TERM GOAL #5   Title BUE/LE strength at least 4+/5    Status Partially Met   strength limited by back pain                  Plan - 05/29/21 1113     Clinical Impression Statement Pt tolerated gradual exercise progressions fairly today. Continued with resisted gait - backward walking noting to make back feel a bit better. Was  able to add STS with airex completed slowly, hesitant initially d/t fear of back pain increasing although this was not noted after completion of exercise. She did report some incr back pain with trial of A-P pelvic tilts and these were discontinued. palpable mms spasms and tightness/tenderness along left periscap mms. gentle MT and heat end of session.    Personal Factors and Comorbidities Comorbidity 2    Stability/Clinical Decision Making Evolving/Moderate complexity    Rehab Potential Fair    PT Frequency 2x / week    PT Duration 4 weeks    PT Treatment/Interventions ADLs/Self Care Home Management;Cryotherapy;Moist Heat;Stair training;Functional mobility training;Therapeutic activities;Therapeutic exercise;Balance training;Neuromuscular re-education;Manual techniques;Patient/family education;Taping;Vestibular;Canalith Repostioning;Electrical Stimulation    PT Next Visit Plan Continue to work on slow progression of cervical/thoracolumbar mobility, strength and flexbility. Lumbopelvic stabilization. Very gentle manual tx, stretching within tolerance.  Pt does get some nice relief with MHP to tender areas and may benefit from this end of session    Consulted and Agree with Plan of Care Patient             Patient will benefit from skilled therapeutic intervention in order to improve the following deficits and impairments:  Abnormal gait, Decreased range of motion, Increased muscle spasms, Dizziness, Decreased balance, Decreased mobility, Decreased strength, Impaired flexibility, Hypomobility, Pain  Visit Diagnosis: Acute low back pain, unspecified back pain laterality, unspecified whether sciatica present  Muscle weakness (generalized)  Cervicalgia  Pain in thoracic spine  Dizziness and giddiness     Problem List Patient Active Problem List   Diagnosis Date Noted   Vertigo 03/25/2021   Gastritis with intestinal metaplasia of stomach 01/10/2021   S/P partial lobectomy of lung  04/12/2020   Nodule of upper lobe of right lung 04/05/2020   Bilateral breast cysts 12/27/2019   Malignant neoplasm of upper lobe of right lung ([place]) 10/12/2019   Sensorineural hearing loss (SNHL) of both ears 06/20/2019   Adenocarcinoma of lung, stage 1, right ([place]) 05/26/2019  Tinnitus of both ears 05/12/2019   B12 deficiency 04/11/2019   Cyst of ovary 04/01/2019   Osteopenia 04/01/2019   Vitamin D deficiency 05/24/2015   Xanthoma of eyelid 12/21/2014   Cyst of kidney, acquired 12/20/2014   Interstitial cystitis (chronic) without hematuria 03/20/2012   Bilateral fibrocystic breast disease (FCBD) 03/20/2012   Hypothyroidism, followed by Dr. [name] 08/16/2008   Hyperlipidemia, LDL 194, HDL 62 08/16/2008   Shortness of breath 08/16/2008   Nontoxic multinodular goiter 08/08/2007    Hall Busing, PT, DPT 05/29/2021, 11:34 AM  [facility] [place], [place], [address] Phone: [phone]   Fax:  [phone]  Name: Imanii Gosdin MRN: [id number] Date of Birth: 1951-09-02

## 2021-05-31 ENCOUNTER — Ambulatory Visit: Payer: No Typology Code available for payment source

## 2021-05-31 ENCOUNTER — Other Ambulatory Visit: Payer: Self-pay

## 2021-05-31 DIAGNOSIS — M545 Low back pain, unspecified: Secondary | ICD-10-CM

## 2021-05-31 DIAGNOSIS — M542 Cervicalgia: Secondary | ICD-10-CM

## 2021-05-31 DIAGNOSIS — M6281 Muscle weakness (generalized): Secondary | ICD-10-CM

## 2021-05-31 DIAGNOSIS — M546 Pain in thoracic spine: Secondary | ICD-10-CM

## 2021-05-31 DIAGNOSIS — R42 Dizziness and giddiness: Secondary | ICD-10-CM

## 2021-05-31 NOTE — Therapy (Signed)
[facility]. [place], [place], [address] Phone: [phone]   Fax:  [phone]  Physical Therapy Treatment  Patient Details  Name: Jocelyn Parker MRN: [id number] Date of Birth: 07/26/1951 Referring Provider (PT): Marikay Alar Date: 05/31/2021   PT End of Session - 05/31/21 1053     Visit Number 19    Date for PT Re-Evaluation 06/25/21    PT Start Time 1048    PT Stop Time 1130    PT Time Calculation (min) 42 min    Activity Tolerance Patient tolerated treatment well    Behavior During Therapy [facility] for tasks assessed/performed             Past Medical History:  Diagnosis Date   Allergy    sulfa   Chronic interstitial cystitis    Lung cancer ([place])    per patient    Thyroid disease     Past Surgical History:  Procedure Laterality Date   ABDOMINAL HYSTERECTOMY     lumps removed from breast     1970's ,1990's and in 2000's  nono cancer    LUNG SURGERY  04/05/2020    There were no vitals filed for this visit.   Subjective Assessment - 05/31/21 1051     Subjective about the same. Saw oncologist, states she was told by oncologist to continue with neurologist.    Currently in Pain? Yes    Pain Score 5     Pain Location Back    Pain Orientation Mid;Lower;Left;Right                               [facility] Adult PT Treatment/Exercise - 05/31/21 0001       Neck Exercises: Machines for Strengthening   Nustep L5 x 6 min      Lumbar Exercises: Machines for Strengthening   Other Lumbar Machine Exercise resisted gait 10# x 8 bwd no pain, lateral with less pain as forward x 4 reps      Lumbar Exercises: Standing   Other Standing Lumbar Exercises standing marches  x 10 each side. 2 sets BUE supported.      Lumbar Exercises: Seated   Sit to Stand Limitations on airex with BUE assist, mat slighlty elevated. 5 x 2, 3x1 stopped because of dizzinesss    Other Seated Lumbar Exercises  seated ab sets with red pball 2" 10x 2. AR press seated with red TB x 10 each side.    Other Seated Lumbar Exercises hip ADD ball squeezes 10 x 2      Modalities   Modalities Moist Heat      Moist Heat Therapy   Number Minutes Moist Heat 5 Minutes    Moist Heat Location Cervical;Lumbar Spine                      PT Short Term Goals - 03/22/21 1626       PT SHORT TERM GOAL #1   Title Independent with initial HEP    Time 2    Period Weeks    Status Achieved    Target Date 03/08/21               PT Long Term Goals - 05/24/21 1059       PT LONG TERM GOAL #1   Title Independent with advanced HEP    Status On-going  PT LONG TERM GOAL #2   Title Pt will demonstrate at least 25% improvement in cervical and thoracolumbar ROM with </= 2/10 pain    Status Partially Met   ROM gradually improving but with continued pain 4-8/10 depending on the day     PT LONG TERM GOAL #3   Title Pt will report improved tolerance to long distance walking with </= 2/10 pain    Status Partially Met   still with pain, worse when she stops walking     PT LONG TERM GOAL #4   Title Pt will be able to negotiate stairs with no greater than 1HR using reciprocal pattern, good control, and </= 2/10 pain to facilitate safety and improved toelrance to stairs in the home and community    Status Partially Met   with pain     PT LONG TERM GOAL #5   Title BUE/LE strength at least 4+/5    Status Partially Met   strength limited by back pain                  Plan - 05/31/21 1137     Clinical Impression Statement Pt tolerated progressions fairly today. Incr dizziness with STS on airex today requriring transition to seated exercises. Given previous incr in back discomfort with seated marches we discussed attempting in standing with UE support and she was able to complete small marches in standing by elevated mat table. After seated rest and getting up to standing she did have a LOB  requiring a seat with c/o incr dizziness - when asked for descriptors stated "just dizzy", possible lightheadedness as well. Able to recover after sitting again and transitioned to sitting with MHP for back and neck end of session. She expresses she tries to avoid getting the sick feeling with movement  and we discussed further today importance of habituation within tolerance to strengthen vestibular, balance (seems to happen most when she weight shifts). Plan to continue pre POC with rests and positional changes for exercises as tolerated, continue with functional movements as best as possible.    Personal Factors and Comorbidities Comorbidity 2    Stability/Clinical Decision Making Evolving/Moderate complexity    Rehab Potential Fair    PT Frequency 2x / week    PT Duration 4 weeks    PT Treatment/Interventions ADLs/Self Care Home Management;Cryotherapy;Moist Heat;Stair training;Functional mobility training;Therapeutic activities;Therapeutic exercise;Balance training;Neuromuscular re-education;Manual techniques;Patient/family education;Taping;Vestibular;Canalith Repostioning;Electrical Stimulation    PT Next Visit Plan Continue to work on slow progression of cervical/thoracolumbar mobility, strength and flexbility. Lumbopelvic stabilization. Very gentle manual tx, stretching within tolerance.  Pt does get some nice relief with MHP to tender areas and may benefit from this end of session    Consulted and Agree with Plan of Care Patient             Patient will benefit from skilled therapeutic intervention in order to improve the following deficits and impairments:  Abnormal gait, Decreased range of motion, Increased muscle spasms, Dizziness, Decreased balance, Decreased mobility, Decreased strength, Impaired flexibility, Hypomobility, Pain  Visit Diagnosis: Acute low back pain, unspecified back pain laterality, unspecified whether sciatica present  Muscle weakness  (generalized)  Cervicalgia  Pain in thoracic spine  Dizziness and giddiness     Problem List Patient Active Problem List   Diagnosis Date Noted   Vertigo 03/25/2021   Gastritis with intestinal metaplasia of stomach 01/10/2021   S/P partial lobectomy of lung 04/12/2020   Nodule of upper lobe of right lung 04/05/2020  Bilateral breast cysts 12/27/2019   Malignant neoplasm of upper lobe of right lung ([place]) 10/12/2019   Sensorineural hearing loss (SNHL) of both ears 06/20/2019   Adenocarcinoma of lung, stage 1, right ([place]) 05/26/2019   Tinnitus of both ears 05/12/2019   B12 deficiency 04/11/2019   Cyst of ovary 04/01/2019   Osteopenia 04/01/2019   Vitamin D deficiency 05/24/2015   Xanthoma of eyelid 12/21/2014   Cyst of kidney, acquired 12/20/2014   Interstitial cystitis (chronic) without hematuria 03/20/2012   Bilateral fibrocystic breast disease (FCBD) 03/20/2012   Hypothyroidism, followed by Dr. [name] 08/16/2008   Hyperlipidemia, LDL 194, HDL 62 08/16/2008   Shortness of breath 08/16/2008   Nontoxic multinodular goiter 08/08/2007    Hall Busing, PT, DPT 05/31/2021, 11:39 AM  [facility]. [place], [place], [address] Phone: [phone]   Fax:  [phone]  Name: Nyelle Wolfson MRN: [id number] Date of Birth: [birth year]

## 2021-06-05 ENCOUNTER — Ambulatory Visit: Payer: No Typology Code available for payment source | Attending: Family Medicine | Admitting: Physical Therapy

## 2021-06-05 ENCOUNTER — Encounter: Payer: Self-pay | Admitting: Physical Therapy

## 2021-06-05 ENCOUNTER — Other Ambulatory Visit: Payer: Self-pay

## 2021-06-05 DIAGNOSIS — M542 Cervicalgia: Secondary | ICD-10-CM | POA: Insufficient documentation

## 2021-06-05 DIAGNOSIS — M6281 Muscle weakness (generalized): Secondary | ICD-10-CM | POA: Diagnosis present

## 2021-06-05 DIAGNOSIS — R42 Dizziness and giddiness: Secondary | ICD-10-CM | POA: Insufficient documentation

## 2021-06-05 DIAGNOSIS — M545 Low back pain, unspecified: Secondary | ICD-10-CM | POA: Insufficient documentation

## 2021-06-05 DIAGNOSIS — M546 Pain in thoracic spine: Secondary | ICD-10-CM | POA: Insufficient documentation

## 2021-06-05 NOTE — Therapy (Signed)
[facility]. [place], [place], [address] Phone: [phone]   Fax:  [phone]  Physical Therapy Treatment Progress Note Reporting Period 04/25/21  to 06/05/21 for visits 11-20  See note below for Objective Data and Assessment of Progress/Goals.     Patient Details  Name: Jocelyn Parker MRN: [id number] Date of Birth: 28-Feb-1951 Referring Provider (PT): Marikay Alar Date: 06/05/2021   PT End of Session - 06/05/21 1100     Visit Number 20    Date for PT Re-Evaluation 06/25/21    PT Start Time 1020    PT Stop Time 1104    PT Time Calculation (min) 44 min    Activity Tolerance Patient tolerated treatment well    Behavior During Therapy [facility] for tasks assessed/performed             Past Medical History:  Diagnosis Date   Allergy    sulfa   Chronic interstitial cystitis    Lung cancer ([place])    per patient    Thyroid disease     Past Surgical History:  Procedure Laterality Date   ABDOMINAL HYSTERECTOMY     lumps removed from breast     1970's ,1990's and in 2000's  nono cancer    LUNG SURGERY  04/05/2020    There were no vitals filed for this visit.   Subjective Assessment - 06/05/21 1020     Subjective "Fine" "I always have pain"    Currently in Pain? Yes    Pain Score 4     Pain Location Back    Pain Orientation Left;Lower                               [facility] Adult PT Treatment/Exercise - 06/05/21 0001       Neck Exercises: Machines for Strengthening   UBE (Upper Arm Bike) L1.5 x 4 min forward, last 2 minutes alternated 30 sconds each fwd/bwd  since consistent bwd more uncomfortable.      Lumbar Exercises: Aerobic   Recumbent Bike L1 x3 min      Lumbar Exercises: Machines for Strengthening   Other Lumbar Machine Exercise resisted gait 10# x side step and backwards x5 each      Lumbar Exercises: Standing   Other Standing Lumbar Exercises 4 inch step up x 5 each     Other Standing Lumbar Exercises AR pess 5lb x5, straight arm pull downs 10lb 2x10      Lumbar Exercises: Seated   Other Seated Lumbar Exercises seated ab sets with red pball 2" 10x 2.      Modalities   Modalities Moist Heat      Moist Heat Therapy   Number Minutes Moist Heat 5 Minutes    Moist Heat Location Cervical;Lumbar Spine                      PT Short Term Goals - 03/22/21 1626       PT SHORT TERM GOAL #1   Title Independent with initial HEP    Time 2    Period Weeks    Status Achieved    Target Date 03/08/21               PT Long Term Goals - 06/05/21 1102       PT LONG TERM GOAL #2   Title Pt will demonstrate at least 25%  improvement in cervical and thoracolumbar ROM with </= 2/10 pain    Status Partially Met      PT LONG TERM GOAL #3   Title Pt will report improved tolerance to long distance walking with </= 2/10 pain    Status Partially Met                   Plan - 06/05/21 1102     Clinical Impression Statement Pt ~ 5 minutes late for today's session. Avoided interventions that required repeated sit to stand due to dizziness from last session. Some initial discomfort with AR press but improved after a few reps. Pt did reports some instability with step ups. Additional reports tolerated with resisted gait, pt attempted forward resisted gait but reports too much of a pull on her lower back.   Plan to continue per POC   Stability/Clinical Decision Making Evolving/Moderate complexity    Rehab Potential Fair    PT Frequency 2x / week    PT Duration 4 weeks    PT Treatment/Interventions ADLs/Self Care Home Management;Cryotherapy;Moist Heat;Stair training;Functional mobility training;Therapeutic activities;Therapeutic exercise;Balance training;Neuromuscular re-education;Manual techniques;Patient/family education;Taping;Vestibular;Canalith Repostioning;Electrical Stimulation    PT Next Visit Plan Continue to work on slow progression of  cervical/thoracolumbar mobility, strength and flexibility. Lumbopelvic stabilization. Very gentle manual tx, stretching within tolerance.  Pt does get some nice relief with MHP to tender areas and may benefit from this end of session             Patient will benefit from skilled therapeutic intervention in order to improve the following deficits and impairments:  Abnormal gait, Decreased range of motion, Increased muscle spasms, Dizziness, Decreased balance, Decreased mobility, Decreased strength, Impaired flexibility, Hypomobility, Pain  Visit Diagnosis: Cervicalgia  Muscle weakness (generalized)  Acute low back pain, unspecified back pain laterality, unspecified whether sciatica present  Pain in thoracic spine     Problem List Patient Active Problem List   Diagnosis Date Noted   Vertigo 03/25/2021   Gastritis with intestinal metaplasia of stomach 01/10/2021   S/P partial lobectomy of lung 04/12/2020   Nodule of upper lobe of right lung 04/05/2020   Bilateral breast cysts 12/27/2019   Malignant neoplasm of upper lobe of right lung ([place]) 10/12/2019   Sensorineural hearing loss (SNHL) of both ears 06/20/2019   Adenocarcinoma of lung, stage 1, right ([place]) 05/26/2019   Tinnitus of both ears 05/12/2019   B12 deficiency 04/11/2019   Cyst of ovary 04/01/2019   Osteopenia 04/01/2019   Vitamin D deficiency 05/24/2015   Xanthoma of eyelid 12/21/2014   Cyst of kidney, acquired 12/20/2014   Interstitial cystitis (chronic) without hematuria 03/20/2012   Bilateral fibrocystic breast disease (FCBD) 03/20/2012   Hypothyroidism, followed by Dr. [name] 08/16/2008   Hyperlipidemia, LDL 194, HDL 62 08/16/2008   Shortness of breath 08/16/2008   Nontoxic multinodular goiter 08/08/2007      Hall Busing, PT, DPT   [name], PT, DPT 06/05/2021, 11:12 AM  [facility]. [place], [place], [address] Phone:  [phone]   Fax:  [phone]  Name: Jocelyn Parker MRN: [id number] Date of Birth: May 20, 1951
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
## 2021-06-06 ENCOUNTER — Ambulatory Visit (INDEPENDENT_AMBULATORY_CARE_PROVIDER_SITE_OTHER): Payer: Medicare Other | Admitting: Family Medicine

## 2021-06-06 VITALS — BP 130/86 | HR 80 | Ht 63.0 in | Wt 114.6 lb

## 2021-06-06 DIAGNOSIS — M545 Low back pain, unspecified: Secondary | ICD-10-CM | POA: Diagnosis not present

## 2021-06-06 DIAGNOSIS — M5414 Radiculopathy, thoracic region: Secondary | ICD-10-CM | POA: Diagnosis not present

## 2021-06-06 DIAGNOSIS — M546 Pain in thoracic spine: Secondary | ICD-10-CM

## 2021-06-06 NOTE — Patient Instructions (Signed)
Thank you for coming in today.   You should hear from MRI scheduling within 1 week. If you do not hear please let me know.    Recheck after the MRIs.   Let me know if you have a problem.

## 2021-06-07 ENCOUNTER — Ambulatory Visit: Payer: No Typology Code available for payment source | Admitting: Physical Therapy

## 2021-06-07 ENCOUNTER — Other Ambulatory Visit: Payer: Self-pay

## 2021-06-07 ENCOUNTER — Encounter: Payer: Self-pay | Admitting: Physical Therapy

## 2021-06-07 DIAGNOSIS — M6281 Muscle weakness (generalized): Secondary | ICD-10-CM

## 2021-06-07 DIAGNOSIS — M546 Pain in thoracic spine: Secondary | ICD-10-CM

## 2021-06-07 DIAGNOSIS — M542 Cervicalgia: Secondary | ICD-10-CM | POA: Diagnosis not present

## 2021-06-07 DIAGNOSIS — M545 Low back pain, unspecified: Secondary | ICD-10-CM

## 2021-06-07 NOTE — Therapy (Signed)
[place]. [place], [place], [address] Phone: [phone]   Fax:  [phone]  Physical Therapy Treatment  Patient Details  Name: Jocelyn Parker MRN: [id number] Date of Birth: 1951/05/14 Referring Provider (PT): Marikay Alar Date: 06/07/2021   PT End of Session - 06/07/21 1053     Visit Number 21    Date for PT Re-Evaluation 06/25/21    PT Start Time 1019    PT Stop Time 1058    PT Time Calculation (min) 39 min    Activity Tolerance Patient tolerated treatment well    Behavior During Therapy [facility] for tasks assessed/performed             Past Medical History:  Diagnosis Date   Allergy    sulfa   Chronic interstitial cystitis    Lung cancer ([place])    per patient    Thyroid disease     Past Surgical History:  Procedure Laterality Date   ABDOMINAL HYSTERECTOMY     lumps removed from breast     1970's ,1990's and in 2000's  nono cancer    LUNG SURGERY  04/05/2020    There were no vitals filed for this visit.   Subjective Assessment - 06/07/21 1021     Subjective Pt reports that last session increased the pain she already had but went away after she was able to rest.    Currently in Pain? Yes    Pain Score 4     Pain Location Back                               [facility] Adult PT Treatment/Exercise - 06/07/21 0001       Neck Exercises: Machines for Strengthening   UBE (Upper Arm Bike) L1.5 x 6 min forward, last 2 minutes alternated 30 sconds each fwd/bwd  since consistent bwd more uncomfortable.      Lumbar Exercises: Machines for Strengthening   Other Lumbar Machine Exercise resisted gait 10# x side step and backwards x5 each    Other Lumbar Machine Exercise 10lb lat pull downs x10      Lumbar Exercises: Standing   Other Standing Lumbar Exercises Hip Ext 2x10 each, AR press green Tband x10 each    Other Standing Lumbar Exercises straight arm pull downs 10lb 2x10       Modalities   Modalities Moist Heat      Moist Heat Therapy   Number Minutes Moist Heat 5 Minutes    Moist Heat Location Cervical                      PT Short Term Goals - 03/22/21 1626       PT SHORT TERM GOAL #1   Title Independent with initial HEP    Time 2    Period Weeks    Status Achieved    Target Date 03/08/21               PT Long Term Goals - 06/05/21 1102       PT LONG TERM GOAL #2   Title Pt will demonstrate at least 25% improvement in cervical and thoracolumbar ROM with </= 2/10 pain    Status Partially Met      PT LONG TERM GOAL #3   Title Pt will report improved tolerance to long distance walking with </= 2/10 pain  Status Partially Met                   Plan - 06/07/21 1054     Clinical Impression Statement Pt report an increase in her symptoms from last session but this pain went away after she was able to rest. When given the option to back off some interventions she requested to se wanted to keep progression. Tband resistance was selected over machine with anti rotational pressed. Some R hip fatigue and weakness reported with hip extensions and with resisted side step going to her L side. Added lat pull downs but was cautious only doing one set to not increase any symptoms.    Personal Factors and Comorbidities Comorbidity 2    Stability/Clinical Decision Making Evolving/Moderate complexity    Rehab Potential Fair    PT Frequency 2x / week    PT Duration 4 weeks    PT Treatment/Interventions ADLs/Self Care Home Management;Cryotherapy;Moist Heat;Stair training;Functional mobility training;Therapeutic activities;Therapeutic exercise;Balance training;Neuromuscular re-education;Manual techniques;Patient/family education;Taping;Vestibular;Canalith Repostioning;Electrical Stimulation    PT Next Visit Plan Continue to work on slow progression of cervical/thoracolumbar mobility, strength and flexbility. Lumbopelvic stabilization. Very  gentle manual tx, stretching within tolerance.  Pt does get some nice relief with MHP to tender areas and may benefit from this end of session             Patient will benefit from skilled therapeutic intervention in order to improve the following deficits and impairments:  Abnormal gait, Decreased range of motion, Increased muscle spasms, Dizziness, Decreased balance, Decreased mobility, Decreased strength, Impaired flexibility, Hypomobility, Pain  Visit Diagnosis: Muscle weakness (generalized)  Cervicalgia  Pain in thoracic spine  Acute low back pain, unspecified back pain laterality, unspecified whether sciatica present     Problem List Patient Active Problem List   Diagnosis Date Noted   Vertigo 03/25/2021   Gastritis with intestinal metaplasia of stomach 01/10/2021   S/P partial lobectomy of lung 04/12/2020   Nodule of upper lobe of right lung 04/05/2020   Bilateral breast cysts 12/27/2019   Malignant neoplasm of upper lobe of right lung ([place]) 10/12/2019   Sensorineural hearing loss (SNHL) of both ears 06/20/2019   Adenocarcinoma of lung, stage 1, right ([place]) 05/26/2019   Tinnitus of both ears 05/12/2019   B12 deficiency 04/11/2019   Cyst of ovary 04/01/2019   Osteopenia 04/01/2019   Vitamin D deficiency 05/24/2015   Xanthoma of eyelid 12/21/2014   Cyst of kidney, acquired 12/20/2014   Interstitial cystitis (chronic) without hematuria 03/20/2012   Bilateral fibrocystic breast disease (FCBD) 03/20/2012   Hypothyroidism, followed by Dr. [name] 08/16/2008   Hyperlipidemia, LDL 194, HDL 62 08/16/2008   Shortness of breath 08/16/2008   Nontoxic multinodular goiter 08/08/2007    [name] 06/07/2021, 10:58 AM  [place]. [place], [place], [address] Phone: [phone]   Fax:  [phone]  Name: Jocelyn Parker MRN: [id number] Date of Birth: 11/08/1950

## 2021-06-12 ENCOUNTER — Ambulatory Visit: Payer: No Typology Code available for payment source

## 2021-06-14 ENCOUNTER — Ambulatory Visit: Payer: No Typology Code available for payment source | Admitting: Physical Therapy

## 2021-06-14 ENCOUNTER — Encounter: Payer: Self-pay | Admitting: Physical Therapy

## 2021-06-14 ENCOUNTER — Other Ambulatory Visit: Payer: Self-pay

## 2021-06-14 DIAGNOSIS — M545 Low back pain, unspecified: Secondary | ICD-10-CM

## 2021-06-14 DIAGNOSIS — M542 Cervicalgia: Secondary | ICD-10-CM | POA: Diagnosis not present

## 2021-06-14 DIAGNOSIS — M6281 Muscle weakness (generalized): Secondary | ICD-10-CM

## 2021-06-14 DIAGNOSIS — M546 Pain in thoracic spine: Secondary | ICD-10-CM

## 2021-06-14 NOTE — Therapy (Signed)
[place]. [place], [place], [address] Phone: [phone]   Fax:  [phone]  Physical Therapy Treatment  Patient Details  Name: Jocelyn Parker MRN: [id number] Date of Birth: [birth year] Referring Provider (PT): Marikay Alar Date: 06/14/2021   PT End of Session - 06/14/21 1101     Visit Number 21    Date for PT Re-Evaluation 06/25/21    PT Start Time 1021    PT Stop Time 1100    PT Time Calculation (min) 39 min    Activity Tolerance Patient tolerated treatment well    Behavior During Therapy [facility] for tasks assessed/performed             Past Medical History:  Diagnosis Date   Allergy    sulfa   Chronic interstitial cystitis    Lung cancer ([place])    per patient    Thyroid disease     Past Surgical History:  Procedure Laterality Date   ABDOMINAL HYSTERECTOMY     lumps removed from breast     1970's ,1990's and in 2000's  nono cancer    LUNG SURGERY  04/05/2020    There were no vitals filed for this visit.   Subjective Assessment - 06/14/21 1023     Subjective Not doing too well today, not sure why.    Currently in Pain? Yes    Pain Score 4     Pain Location Back    Pain Orientation Right                               [facility] Adult PT Treatment/Exercise - 06/14/21 0001       Neck Exercises: Machines for Strengthening   UBE (Upper Arm Bike) L1.5 x 6 min forward, last 2 minutes alternated 30 sconds each fwd/bwd  since consistent bwd more uncomfortable.      Lumbar Exercises: Stretches   Passive Hamstring Stretch Left;Right;5 reps;10 seconds;20 seconds    Single Knee to Chest Stretch Right;Left;4 reps;10 seconds;20 seconds    Double Knee to Chest Stretch 10 seconds;20 seconds;3 reps    Lower Trunk Rotation 3 reps;10 seconds;20 seconds      Lumbar Exercises: Standing   Other Standing Lumbar Exercises ^ in step ups x10 with RLE, X 6 with L      Modalities    Modalities Moist Heat      Moist Heat Therapy   Number Minutes Moist Heat 6 Minutes    Moist Heat Location Cervical                      PT Short Term Goals - 06/14/21 1105       PT SHORT TERM GOAL #1   Title Independent with initial HEP    Status Achieved               PT Long Term Goals - 06/14/21 1105       PT LONG TERM GOAL #2   Title Pt will demonstrate at least 25% improvement in cervical and thoracolumbar ROM with </= 2/10 pain    Status Partially Met      PT LONG TERM GOAL #3   Title Pt will report improved tolerance to long distance walking with </= 2/10 pain    Status Partially Met  Plan - 06/14/21 1105     Clinical Impression Statement Pt enters clinic reporting increase pain but she didn't know why. She stated she was waiting on a phone call to see if she could get into the neurologist tomorrow so she  didn't want to do anything that could possible cause pain. Pt agreed to some passive stretching. Some spasms reported in her mis back area after attempting step ups with LLE. Bilateral LE tightness noted with all passive stretching.    Personal Factors and Comorbidities Comorbidity 2    Stability/Clinical Decision Making Evolving/Moderate complexity    Rehab Potential Fair    PT Frequency 2x / week    PT Duration 4 weeks    PT Treatment/Interventions ADLs/Self Care Home Management;Cryotherapy;Moist Heat;Stair training;Functional mobility training;Therapeutic activities;Therapeutic exercise;Balance training;Neuromuscular re-education;Manual techniques;Patient/family education;Taping;Vestibular;Canalith Repostioning;Electrical Stimulation    PT Next Visit Plan Continue to work on slow progression of cervical/thoracolumbar mobility, strength and flexbility. Lumbopelvic stabilization. Very gentle manual tx, stretching within tolerance.  Pt does get some nice relief with MHP to tender areas and may benefit from this end of session              Patient will benefit from skilled therapeutic intervention in order to improve the following deficits and impairments:  Abnormal gait, Decreased range of motion, Increased muscle spasms, Dizziness, Decreased balance, Decreased mobility, Decreased strength, Impaired flexibility, Hypomobility, Pain  Visit Diagnosis: Muscle weakness (generalized)  Cervicalgia  Acute low back pain, unspecified back pain laterality, unspecified whether sciatica present  Pain in thoracic spine     Problem List Patient Active Problem List   Diagnosis Date Noted   Vertigo 03/25/2021   Gastritis with intestinal metaplasia of stomach 01/10/2021   S/P partial lobectomy of lung 04/12/2020   Nodule of upper lobe of right lung 04/05/2020   Bilateral breast cysts 12/27/2019   Malignant neoplasm of upper lobe of right lung ([place]) 10/12/2019   Sensorineural hearing loss (SNHL) of both ears 06/20/2019   Adenocarcinoma of lung, stage 1, right ([place]) 05/26/2019   Tinnitus of both ears 05/12/2019   B12 deficiency 04/11/2019   Cyst of ovary 04/01/2019   Osteopenia 04/01/2019   Vitamin D deficiency 05/24/2015   Xanthoma of eyelid 12/21/2014   Cyst of kidney, acquired 12/20/2014   Interstitial cystitis (chronic) without hematuria 03/20/2012   Bilateral fibrocystic breast disease (FCBD) 03/20/2012   Hypothyroidism, followed by Dr. [name] 08/16/2008   Hyperlipidemia, LDL 194, HDL 62 08/16/2008   Shortness of breath 08/16/2008   Nontoxic multinodular goiter 08/08/2007    [name] 06/14/2021, 11:14 AM  [place]. [place], [place], [address] Phone: [phone]   Fax:  [phone]  Name: Jocelyn Parker MRN: [id number] Date of Birth: Jul 03, 1951

## 2021-06-19 ENCOUNTER — Ambulatory Visit: Payer: No Typology Code available for payment source

## 2021-06-19 ENCOUNTER — Other Ambulatory Visit: Payer: Self-pay

## 2021-06-19 DIAGNOSIS — M545 Low back pain, unspecified: Secondary | ICD-10-CM

## 2021-06-19 DIAGNOSIS — M546 Pain in thoracic spine: Secondary | ICD-10-CM

## 2021-06-19 DIAGNOSIS — M6281 Muscle weakness (generalized): Secondary | ICD-10-CM

## 2021-06-19 DIAGNOSIS — M542 Cervicalgia: Secondary | ICD-10-CM | POA: Diagnosis not present

## 2021-06-19 DIAGNOSIS — R42 Dizziness and giddiness: Secondary | ICD-10-CM

## 2021-06-19 NOTE — Therapy (Signed)
[place]. [place], [place], [address] Phone: [phone]   Fax:  [phone]  Physical Therapy Treatment  Patient Details  Name: Jocelyn Parker MRN: [id number] Date of Birth: 1951/08/27 Referring Provider (PT): Georgina Snell   Encounter Date: 06/19/2021   PT End of Session - 06/19/21 1009     Visit Number 23    Date for PT Re-Evaluation 06/25/21    PT Start Time 1001    PT Stop Time 1054 (MHP to neck and back 8 minutes end of session)   PT Time Calculation (min) 53 min    Activity Tolerance Patient tolerated treatment well    Behavior During Therapy [facility] for tasks assessed/performed             Past Medical History:  Diagnosis Date   Allergy    sulfa   Chronic interstitial cystitis    Lung cancer ([place])    per patient    Thyroid disease     Past Surgical History:  Procedure Laterality Date   ABDOMINAL HYSTERECTOMY     lumps removed from breast     1970's ,1990's and in 2000's  nono cancer    LUNG SURGERY  04/05/2020    There were no vitals filed for this visit.   Subjective Assessment - 06/19/21 1006     Subjective Saw neurologist on friday. Feeling about the same. felt like arm bike backward aggravates back a bit more.    Currently in Pain? Yes    Pain Score 4     Pain Location Back                               [facility] Adult PT Treatment/Exercise - 06/19/21 0001       Therapeutic Activites    Other Therapeutic Activities Educaiton regarding fear avoidance as it pertains to pain, importance of graded continuation of activity as tolerated, pain education handout also reviewed and provided      Neck Exercises: Machines for Strengthening   Nustep L4 x 6 min      Lumbar Exercises: Seated   Long Arc Quad on Chair Strengthening;Both;2 sets;10 reps    LAQ on Chair Weights (lbs) 1#    Other Seated Lumbar Exercises march with 1# AW B 2 x 10 B           Step ups to 6" step x  10 alt B - 1 Uetouchdown support moreso towards end of set for balance with fatigue Abdominal isometrics with small ball 2 x 10 vs large pball x 15 QP diaphragmatic breathing with TrA engagement x 5 reps Seated ball squeeze x 20   MHP neck and back 8 minutes end of session.      PT Education - 06/19/21 1049     Education Details Updated HEP Access Code: [email or web address]. Also provided handout regarding pain education    Person(s) Educated Patient    Methods Explanation;Demonstration;Handout    Comprehension Verbalized understanding;Returned demonstration              PT Short Term Goals - 06/14/21 1105       PT SHORT TERM GOAL #1   Title Independent with initial HEP    Status Achieved               PT Long Term Goals - 06/14/21 1105       PT LONG TERM GOAL #2  Title Pt will demonstrate at least 25% improvement in cervical and thoracolumbar ROM with </= 2/10 pain    Status Partially Met      PT LONG TERM GOAL #3   Title Pt will report improved tolerance to long distance walking with </= 2/10 pain    Status Partially Met                   Plan - 06/19/21 1009     Clinical Impression Statement Pt reports no change since previous. No issue or difference with stretching from previous visit. We discussed today that given plateau in progress, no changes it is appropriate that she be discharged from PT - plan for next visit to be her last for this episode of care. We spent today reviewing updated exercises for home program. She demonstrated difficulty and required some cueing with QP diaphragmatic breathing with alot of tension held in the upper body but was able to improve with cues, this did not exacerbate back pain but it did mildly bother dizziness but this did not persist. We also spent some time on pain education and discussing fearfulness of pain and how she reports limiting mobility to avoid pain, encouraged continuing to gently progress mobility as tolerated to  avoid any further mms weakness and tightness.    PT Next Visit Plan Reassess tolerance to updated exercises and updated HEP components next visit. Modify as needed. Plan for discharge next visit.    Consulted and Agree with Plan of Care Patient             Patient will benefit from skilled therapeutic intervention in order to improve the following deficits and impairments:  Abnormal gait, Decreased range of motion, Increased muscle spasms, Dizziness, Decreased balance, Decreased mobility, Decreased strength, Impaired flexibility, Hypomobility, Pain  Visit Diagnosis: Muscle weakness (generalized)  Cervicalgia  Acute low back pain, unspecified back pain laterality, unspecified whether sciatica present  Pain in thoracic spine  Dizziness and giddiness     Problem List Patient Active Problem List   Diagnosis Date Noted   Vertigo 03/25/2021   Gastritis with intestinal metaplasia of stomach 01/10/2021   S/P partial lobectomy of lung 04/12/2020   Nodule of upper lobe of right lung 04/05/2020   Bilateral breast cysts 12/27/2019   Malignant neoplasm of upper lobe of right lung ([place]) 10/12/2019   Sensorineural hearing loss (SNHL) of both ears 06/20/2019   Adenocarcinoma of lung, stage 1, right ([place]) 05/26/2019   Tinnitus of both ears 05/12/2019   B12 deficiency 04/11/2019   Cyst of ovary 04/01/2019   Osteopenia 04/01/2019   Vitamin D deficiency 05/24/2015   Xanthoma of eyelid 12/21/2014   Cyst of kidney, acquired 12/20/2014   Interstitial cystitis (chronic) without hematuria 03/20/2012   Bilateral fibrocystic breast disease (FCBD) 03/20/2012   Hypothyroidism, followed by Dr. [name] 08/16/2008   Hyperlipidemia, LDL 194, HDL 62 08/16/2008   Shortness of breath 08/16/2008   Nontoxic multinodular goiter 08/08/2007    Hall Busing, PT, DPT 06/19/2021, 10:59 AM  [place]. [place], [place], [address] Phone:  [phone]   Fax:  [phone]  Name: Jocelyn Parker MRN: [id number] Date of Birth: 1951-08-11

## 2021-06-19 NOTE — Patient Instructions (Signed)
Access Code: [email or web address] URL: [email or web address] Date: 06/19/2021 Prepared by: Sherlynn Stalls  Exercises Seated Shoulder Horizontal Abduction with Resistance - 1 x daily - 4 x weekly - 1-2 sets - 10 reps Seated Bilateral Shoulder External Rotation with Resistance - 1 x daily - 4 x weekly - 1-2 sets - 10 reps Seated Shoulder Rolls - 1 x daily - 4 x weekly - 1-2 sets - 10 reps Seated inner thigh squeeze with ball or pillow - 1 x daily - 7 x weekly - 1-2 sets - 10-15 reps Seated Long Arc Quad with Ankle Weight - 1 x daily - 7 x weekly - 2 sets - 10-15 reps Seated Hip Flexion March with Ankle Weights - 1 x daily - 7 x weekly - 2 sets - 10-15 reps Seated Abdominal Press into [employer] - 1 x daily - 7 x weekly - 2 sets - 10-15 reps Seated Gaze Stabilization with Head Rotation - 1 x daily - 7 x weekly - 2 sets - 10 reps Quadruped Diaphragmatic Breathing - 1 x daily - 7 x weekly - 4 sets - 5 reps Supine Cervical Rotation AROM on Pillow - 1 x daily - 7 x weekly - 2 sets - 10 reps  Patient Education What Is Pain?

## 2021-06-21 ENCOUNTER — Ambulatory Visit: Payer: No Typology Code available for payment source

## 2021-06-21 ENCOUNTER — Other Ambulatory Visit: Payer: Self-pay

## 2021-06-21 DIAGNOSIS — M542 Cervicalgia: Secondary | ICD-10-CM

## 2021-06-21 DIAGNOSIS — M545 Low back pain, unspecified: Secondary | ICD-10-CM

## 2021-06-21 DIAGNOSIS — R42 Dizziness and giddiness: Secondary | ICD-10-CM

## 2021-06-21 DIAGNOSIS — M546 Pain in thoracic spine: Secondary | ICD-10-CM

## 2021-06-21 DIAGNOSIS — M6281 Muscle weakness (generalized): Secondary | ICD-10-CM

## 2021-06-21 NOTE — Therapy (Addendum)
[place] [place], [place], [address] Phone: [phone]   Fax:  [phone]  Physical Therapy Treatment/ Discharge  Patient Details  Name: Jocelyn Parker MRN: [id number] Date of Birth: June 19, 1951 Referring Provider (PT): Marikay Alar Date: 06/21/2021   PT End of Session - 06/21/21 1053     Visit Number 24    Date for PT Re-Evaluation 06/25/21    PT Start Time 1049    PT Stop Time 1130    PT Time Calculation (min) 41 min    Activity Tolerance Patient tolerated treatment well    Behavior During Therapy [facility] for tasks assessed/performed             Past Medical History:  Diagnosis Date   Allergy    sulfa   Chronic interstitial cystitis    Lung cancer ([place])    per patient    Thyroid disease     Past Surgical History:  Procedure Laterality Date   ABDOMINAL HYSTERECTOMY     lumps removed from breast     1970's ,1990's and in 2000's  nono cancer    LUNG SURGERY  04/05/2020    There were no vitals filed for this visit.   Subjective Assessment - 06/21/21 1051     Subjective No changes, that mid back just continues to be painful. Didnt get a chance to do the new exercises from last visit    Diagnostic tests 03/23/2021: thoracic spine Xray: No definite evidence of acute fracture. Mild apparent height loss  and superior endplate irregularity of a few mid upper thoracic  vertebral bodies appears similar to prior thoracic radiographs and  is therefore favored degenerative/secondary to obliquity in the  setting of mild scoliosis. A CT or MRI of the thoracic spine could  provide more sensitive evaluation for acute fracture if clinically  indicated    Currently in Pain? Yes    Pain Score 4     Pain Location Back    Pain Orientation Mid                [facility] PT Assessment - 06/21/21 0001       Assessment   Medical Diagnosis M79.18 (ICD-10-CM) - Pain of rhomboid muscle  M54.50 (ICD-10-CM) - Pain,  lumbar region    Referring Provider (PT) Georgina Snell      Strength   Overall Strength Comments 4+/5 B knees (LBP with knee flex and hip flex  resisted)                           [facility] Adult PT Treatment/Exercise - 06/21/21 0001       Neck Exercises: Machines for Strengthening   Nustep L4 x 6 min      Lumbar Exercises: Standing   Other Standing Lumbar Exercises Step ups to 6" step x 10 alt B - 1 Uetouchdown support moreso towards end of set for balance with fatigue      Lumbar Exercises: Seated   Long Arc Quad on Chair Strengthening;Both;10 reps;3 sets    LAQ on Chair Weights (lbs) 1#    Other Seated Lumbar Exercises march with 1# AW B 2 x 10    Other Seated Lumbar Exercises Abdominal isometrics with large blue ball 2 x 15  Seated ball squeeze x 30      Lumbar Exercises: Quadruped   Other Quadruped Lumbar Exercises QP diaphragmatic breathing with TrA engagement x 5 reps,  seated diaphragmatic breathing      Moist Heat Therapy   Number Minutes Moist Heat 5 Minutes   MHP to thoracic/lumbar end of session                   PT Education - 06/21/21 1126     Education Details [id number] reviewed, added seated diaphragmatic breathing as an alternative to QP    Person(s) Educated Patient    Methods Explanation;Demonstration    Comprehension Verbalized understanding              PT Short Term Goals - 06/14/21 1105       PT SHORT TERM GOAL #1   Title Independent with initial HEP    Status Achieved               PT Long Term Goals - 06/21/21 1054       PT LONG TERM GOAL #1   Title Independent with advanced HEP    Status Partially Met      PT LONG TERM GOAL #2   Title Pt will demonstrate at least 25% improvement in cervical and thoracolumbar ROM with </= 2/10 pain    Status Partially Met      PT LONG TERM GOAL #3   Title Pt will report improved tolerance to long distance walking with </= 2/10 pain    Status Partially Met      PT LONG TERM  GOAL #4   Title Pt will be able to negotiate stairs with no greater than 1HR using reciprocal pattern, good control, and </= 2/10 pain to facilitate safety and improved tolerance to stairs in the home and community    Status On-going   Does report almost tripping coming down stairs this morning     PT LONG TERM GOAL #5   Title BUE/LE strength at least 4+/5    Status Partially Met   limited by back pain                  Plan - 06/21/21 1056     Clinical Impression Statement Pt has completed a total of 24 PT visits for this episode of care, in this time there was a break in care of 3-4 weeks d/t scheduling issues and 1 visit that had to be cancelled d/t therapist out sick and inability to reschedule at clinic d/t no other appointment times available that day. Pt does report not getting a chance to do new exercises at home so we reviewed them again todays session, otherwise doing home exercises in some form each day. She overall has plateaued in progress at this time as it pertains to mid back/low back pain. MRI is pending at this time with orders placed by MD on 06/06/21, pt awaiting call back for approval to get them done. Reviewed updated discharge HEP again with patient today with overall good tolerance, no questions and little to no cues needed for performance. Did provide alternate for diaphragmatic breathing in seated instead of QP, was challenging to coordinate but she  was able to complete a few repetitions nicely after alot of practice. i believe this exercise will further be beneficial to work on decreasing mms tension. Pt verbalized agreement and understanding to discharge from PT at this time    Personal Factors and Comorbidities Comorbidity 2    Stability/Clinical Decision Making Evolving/Moderate complexity    PT Next Visit Plan Discharge to independent HEP at this time. Also discussed that if pt ever needs  PT in future, she may return with new referral .    Consulted and Agree  with Plan of Care Patient             Patient will benefit from skilled therapeutic intervention in order to improve the following deficits and impairments:  Abnormal gait, Decreased range of motion, Increased muscle spasms, Dizziness, Decreased balance, Decreased mobility, Decreased strength, Impaired flexibility, Hypomobility, Pain  Visit Diagnosis: Muscle weakness (generalized)  Cervicalgia  Acute low back pain, unspecified back pain laterality, unspecified whether sciatica present  Pain in thoracic spine  Dizziness and giddiness     Problem List Patient Active Problem List   Diagnosis Date Noted   Vertigo 03/25/2021   Gastritis with intestinal metaplasia of stomach 01/10/2021   S/P partial lobectomy of lung 04/12/2020   Nodule of upper lobe of right lung 04/05/2020   Bilateral breast cysts 12/27/2019   Malignant neoplasm of upper lobe of right lung ([place]) 10/12/2019   Sensorineural hearing loss (SNHL) of both ears 06/20/2019   Adenocarcinoma of lung, stage 1, right ([place]) 05/26/2019   Tinnitus of both ears 05/12/2019   B12 deficiency 04/11/2019   Cyst of ovary 04/01/2019   Osteopenia 04/01/2019   Vitamin D deficiency 05/24/2015   Xanthoma of eyelid 12/21/2014   Cyst of kidney, acquired 12/20/2014   Interstitial cystitis (chronic) without hematuria 03/20/2012   Bilateral fibrocystic breast disease (FCBD) 03/20/2012   Hypothyroidism, followed by Dr. [name] 08/16/2008   Hyperlipidemia, LDL 194, HDL 62 08/16/2008   Shortness of breath 08/16/2008   Nontoxic multinodular goiter 08/08/2007   PHYSICAL THERAPY DISCHARGE SUMMARY  Visits from Start of Care: 24  Plan: Patient agrees to discharge.  Patient goals were partially met. Patient is being discharged due to current plateau in progress, wanting further medical follow up for persistent thoracic pain     Hall Busing, PT, DPT 06/21/2021, 11:42 AM  [address] [place], [place], [address] Phone: [phone]   Fax:  [phone]  Name: Jocelyn Parker MRN: [id number] Date of Birth: 01-15-1951

## 2021-07-04 ENCOUNTER — Other Ambulatory Visit: Payer: Self-pay

## 2021-07-04 ENCOUNTER — Ambulatory Visit
Admission: RE | Admit: 2021-07-04 | Discharge: 2021-07-04 | Disposition: A | Discharge status: Home / Self Care | Payer: Medicare Other | Source: Ambulatory Visit | Attending: Family Medicine | Admitting: Family Medicine

## 2021-07-04 DIAGNOSIS — M545 Low back pain, unspecified: Secondary | ICD-10-CM

## 2021-07-04 DIAGNOSIS — M546 Pain in thoracic spine: Secondary | ICD-10-CM

## 2021-07-10 NOTE — Progress Notes (Signed)
MRI thoracic spine shows multiple small bulging disks but none seem to be dramatically affecting the spinal cord or the nerve roots.  Please schedule follow-up appointment with me in the near future to go over this and the lumbar spine MRI.

## 2021-07-10 NOTE — Progress Notes (Signed)
MRI lumbar spine shows a bulging disc at L5-S1.  Additionally there are areas of facet arthritis that could produce low back pain.  Please schedule follow-up appointment with me in the near future to go over the results in full detail.

## 2021-07-11 DIAGNOSIS — C3490 Malignant neoplasm of unspecified part of unspecified bronchus or lung: Secondary | ICD-10-CM | POA: Insufficient documentation

## 2021-07-13 ENCOUNTER — Ambulatory Visit (INDEPENDENT_AMBULATORY_CARE_PROVIDER_SITE_OTHER): Payer: Medicare Other | Admitting: Family Medicine

## 2021-07-13 ENCOUNTER — Other Ambulatory Visit: Payer: Self-pay

## 2021-07-13 VITALS — BP 118/78 | HR 66 | Wt 115.2 lb

## 2021-07-13 DIAGNOSIS — M546 Pain in thoracic spine: Secondary | ICD-10-CM

## 2021-07-13 DIAGNOSIS — R0789 Other chest pain: Secondary | ICD-10-CM | POA: Diagnosis not present
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
## 2021-07-13 NOTE — Patient Instructions (Signed)
Thank you for coming in today.   You should hear from Dr Romona Curls office soon.   Let me know if you do not hear anything.

## 2021-07-17 ENCOUNTER — Encounter: Payer: Self-pay | Admitting: Podiatry

## 2021-07-17 ENCOUNTER — Ambulatory Visit (INDEPENDENT_AMBULATORY_CARE_PROVIDER_SITE_OTHER): Payer: Medicare Other

## 2021-07-17 ENCOUNTER — Ambulatory Visit (INDEPENDENT_AMBULATORY_CARE_PROVIDER_SITE_OTHER): Payer: Medicare Other | Admitting: Podiatry

## 2021-07-17 ENCOUNTER — Other Ambulatory Visit: Payer: Self-pay

## 2021-07-17 DIAGNOSIS — S93401D Sprain of unspecified ligament of right ankle, subsequent encounter: Secondary | ICD-10-CM | POA: Diagnosis not present

## 2021-07-17 DIAGNOSIS — E063 Autoimmune thyroiditis: Secondary | ICD-10-CM | POA: Insufficient documentation

## 2021-07-17 DIAGNOSIS — K900 Celiac disease: Secondary | ICD-10-CM | POA: Insufficient documentation

## 2021-07-17 DIAGNOSIS — B351 Tinea unguium: Secondary | ICD-10-CM

## 2021-07-17 DIAGNOSIS — E049 Nontoxic goiter, unspecified: Secondary | ICD-10-CM | POA: Insufficient documentation

## 2021-07-17 DIAGNOSIS — S90222A Contusion of left lesser toe(s) with damage to nail, initial encounter: Secondary | ICD-10-CM | POA: Diagnosis not present

## 2021-07-17 DIAGNOSIS — S99911A Unspecified injury of right ankle, initial encounter: Secondary | ICD-10-CM

## 2021-07-17 DIAGNOSIS — S93401S Sprain of unspecified ligament of right ankle, sequela: Secondary | ICD-10-CM

## 2021-07-17 DIAGNOSIS — R7301 Impaired fasting glucose: Secondary | ICD-10-CM | POA: Insufficient documentation

## 2021-07-17 NOTE — Patient Instructions (Signed)
Ankle Sprain, Phase I Rehab An ankle sprain is an injury to the ligaments of your ankle. Ankle sprains cause stiffness, loss of motion, and loss of strength. Ask your health care provider which exercises are safe for you. Do exercises exactly as told by your health care provider and adjust them as directed. It is normal to feel mild stretching, pulling, tightness, or discomfort as you do these exercises. Stop right away if you feel sudden pain or your pain gets worse. Do not begin these exercises until told by your health care provider. Stretching and range-of-motion exercises These exercises warm up your muscles and joints and improve the movement and flexibility of your lower leg and ankle. These exercises also help to relieve pain and stiffness. Gastroc and soleus stretch This exercise is also called a calf stretch. It stretches the muscles in the back of the lower leg. These muscles are the gastrocnemius, or gastroc, and the soleus. Sit on the floor with your left / right leg extended. Loop a belt or towel around the ball of your left / right foot. The ball of your foot is on the walking surface, right under your toes. Keep your left / right ankle and foot relaxed and keep your knee straight while you use the belt or towel to pull your foot toward you. You should feel a gentle stretch behind your calf or knee in your gastroc muscle. Hold this position for __________ seconds, then release to the starting position. Repeat the exercise with your knee bent. You can put a pillow or a rolled bath towel under your knee to support it. You should feel a stretch deep in your calf in the soleus muscle or at your Achilles tendon. Repeat __________ times. Complete this exercise __________ times a day. Ankle alphabet  Sit with your left / right leg supported at the lower leg. Do not rest your foot on anything. Make sure your foot has room to move freely. Think of your left / right foot as a paintbrush. Move  your foot to trace each letter of the alphabet in the air. Keep your hip and knee still while you trace. Make the letters as large as you can without feeling discomfort. Trace every letter from A to Z. Repeat __________ times. Complete this exercise __________ times a day. Strengthening exercises These exercises build strength and endurance in your ankle and lower leg. Endurance is the ability to use your muscles for a long time, even after they get tired. Ankle dorsiflexion  Secure a rubber exercise band or tube to an object, such as a table leg, that will stay still when the band is pulled. Secure the other end around your left / right foot. Sit on the floor facing the object, with your left / right leg extended. The band or tube should be slightly tense when your foot is relaxed. Slowly bring your foot toward you, bringing the top of your foot toward your shin (dorsiflexion), and pulling the band tighter. Hold this position for __________ seconds. Slowly return your foot to the starting position. Repeat __________ times. Complete this exercise __________ times a day. Ankle plantar flexion  Sit on the floor with your left / right leg extended. Loop a rubber exercise tube or band around the ball of your left / right foot. The ball of your foot is on the walking surface, right under your toes. Hold the ends of the band or tube in your hands. The band or tube should be slightly  tense when your foot is relaxed. Slowly point your foot and toes downward to tilt the top of your foot away from your shin (plantar flexion). Hold this position for __________ seconds. Slowly return your foot to the starting position. Repeat __________ times. Complete this exercise __________ times a day. Ankle eversion Sit on the floor with your legs straight out in front of you. Loop a rubber exercise band or tube around the ball of your left / right foot. The ball of your foot is on the walking surface, right under  your toes. Hold the ends of the band in your hands, or secure the band to a stable object. The band or tube should be slightly tense when your foot is relaxed. Slowly push your foot outward, away from your other leg (eversion). Hold this position for __________ seconds. Slowly return your foot to the starting position. Repeat __________ times. Complete this exercise __________ times a day. This information is not intended to replace advice given to you by your health care provider. Make sure you discuss any questions you have with your health care provider. Document Revised: 12/14/2020 Document Reviewed: 12/14/2020 Elsevier Patient Education  2022 [employer].

## 2021-07-18 ENCOUNTER — Encounter: Payer: Self-pay | Admitting: Physical Medicine and Rehabilitation

## 2021-07-18 ENCOUNTER — Ambulatory Visit (INDEPENDENT_AMBULATORY_CARE_PROVIDER_SITE_OTHER): Payer: Medicare Other | Admitting: Physical Medicine and Rehabilitation

## 2021-07-18 VITALS — BP 110/67 | HR 74

## 2021-07-18 DIAGNOSIS — M7918 Myalgia, other site: Secondary | ICD-10-CM

## 2021-07-18 DIAGNOSIS — S29019S Strain of muscle and tendon of unspecified wall of thorax, sequela: Secondary | ICD-10-CM

## 2021-07-18 DIAGNOSIS — G8929 Other chronic pain: Secondary | ICD-10-CM

## 2021-07-18 DIAGNOSIS — M546 Pain in thoracic spine: Secondary | ICD-10-CM | POA: Diagnosis not present

## 2021-07-18 DIAGNOSIS — G588 Other specified mononeuropathies: Secondary | ICD-10-CM | POA: Diagnosis not present

## 2021-07-18 NOTE — Progress Notes (Signed)
Mid and upper back pain. Pain start on right side of upper back and radiates around right side. Also feels pain on left side near scapula.  Numeric Pain Rating Scale and Functional Assessment Average Pain 5 Pain Right Now 3 My pain is constant, dull, and aching Pain is worse with: some activites Pain improves with: rest   In the last MONTH (on 0-10 scale) has pain interfered with the following?  1. General activity like being  able to carry out your everyday physical activities such as walking, climbing stairs, carrying groceries, or moving a chair?  Rating(8)  2. Relation with others like being able to carry out your usual social activities and roles such as  activities at home, at work and in your community. Rating(8)  3. Enjoyment of life such that you have  been bothered by emotional problems such as feeling anxious, depressed or irritable?  Rating(3)

## 2021-07-22 NOTE — Progress Notes (Signed)
Subjective:   Patient ID: Jocelyn Parker, female   DOB: 70 y.o.   MRN: [id number]   HPI 70 year old female presents the office with concerns of the left second toenail becoming darkened over the last 2 to 2-1/2 months.  Recall any injury to the area and it came on suddenly.  She has no pain.  No swelling or redness or any drainage.  She has had no recent treatment.  She has secondary concerns of right ankle discomfort which started after an injury many years ago.  She states that it is more of a nagging sensation as opposed to pain.  Describes an inversion type ankle sprain.   Review of Systems  All other systems reviewed and are negative.  Past Medical History:  Diagnosis Date   Allergy    sulfa   Chronic interstitial cystitis    Lung cancer ([place])    per patient    Thyroid disease     Past Surgical History:  Procedure Laterality Date   ABDOMINAL HYSTERECTOMY     lumps removed from breast     1970's ,1990's and in 2000's  nono cancer    LUNG SURGERY  04/05/2020     Current Outpatient Medications:    calcium carbonate (OS-CAL) 1250 (500 Ca) MG chewable tablet, 1 tablet, Disp: , Rfl:    calcium carbonate (TUMS - DOSED IN MG ELEMENTAL CALCIUM) 500 MG chewable tablet, 1 tablet, Disp: , Rfl:    cyanocobalamin (,VITAMIN B-12,) 1000 MCG/ML injection, Inject 1 mL (1,000 mcg total) into the muscle every 21 ( twenty-one) days., Disp: 10 mL, Rfl: 3   Cyanocobalamin (VITAMIN B12) 3000 MCG/ML LIQD, See admin instructions., Disp: , Rfl:    diazepam (VALIUM) 2 MG tablet, diazepam 2 mg tablet  TAKE 1 TABLET BY MOUTH EVERY AT BEDTIME FOR VERTIGO, Disp: , Rfl:    ergocalciferol (VITAMIN D2) 1.25 MG (50000 UT) capsule, Take 1 capsule (50,000 Units total) by mouth every 30 (thirty) days., Disp: 6 capsule, Rfl: 1   estradiol (VIVELLE-DOT) 0.1 MG/24HR, Place 1 patch (0.1 mg total) onto the skin 2 (two) times a week., Disp: 8 patch, Rfl: 12   fluticasone (FLONASE) 50 MCG/ACT nasal spray, SHAKE LIQUID  AND USE 2 SPRAYS IN EACH NOSTRIL DAILY (Patient taking differently: as needed. SHAKE LIQUID AND USE 2 SPRAYS IN EACH NOSTRIL DAILY), Disp: 16 g, Rfl: 2   ibuprofen (ADVIL) 800 MG tablet, Take 1 tablet (800 mg total) by mouth every 8 (eight) hours as needed. ibuprofen 800 mg tablet, Disp: 30 tablet, Rfl: 1   levothyroxine (SYNTHROID) 50 MCG tablet, Take 50 mcg by mouth daily before breakfast., Disp: , Rfl:    levothyroxine (SYNTHROID, LEVOTHROID) 75 MCG tablet, Take 75 mcg by mouth daily., Disp: , Rfl:    meclizine (ANTIVERT) 12.5 MG tablet, Take 1-2 tablets as needed every 8 hours for dizziness., Disp: 60 tablet, Rfl: 1   Syringe/Needle, Disp, (SYRINGE 3CC/25GX1") 25G X 1" 3 ML MISC, One injection monthly, Disp: 12 each, Rfl: 0   tiZANidine (ZANAFLEX) 4 MG tablet, Take by mouth., Disp: , Rfl:    UNABLE TO FIND, Med Name: CBD Cream, Disp: , Rfl:   Allergies  Allergen Reactions   Sulfa Antibiotics Hives   Montelukast Sodium     dizziness         Objective:  Physical Exam  General: AAO x3, NAD  Dermatological: The left second digit toenail is mildly hypertrophic and loose with underlying nailbed.  I was able to remove  the nails able to remove dried blood present.  The nail has some dark discoloration of the nail but there is no extension of any hyperpigmentation into the surrounding skin.  Vascular: Dorsalis Pedis artery and Posterior Tibial artery pedal pulses are 2/4 bilateral with immedate capillary fill time.  There is no pain with calf compression, swelling, warmth, erythema.   Neruologic: Grossly intact via light touch bilateral.   Musculoskeletal: Minimal discomfort on the anterior lateral aspect of the ankle joint along the course of the ATFL.  Anterior drawer, talar tilt test is negative.  No pain with ankle joint range of motion is no crepitation.  Muscular strength 5/5 in all groups tested bilateral.  Gait: Unassisted, Nonantalgic.       Assessment:   70 year old female  left second digit onychodystrophy/likely subungual hematoma; right ankle sprain sequela     Plan:  -Treatment options discussed including all alternatives, risks, and complications -Etiology of symptoms were discussed -X-rays were obtained and reviewed with the patient of the right ankle.  There is no evidence of acute fracture. -In regards to the left side second digit toenail is able to sharply debride the nail with any complications and I was able to remove some dried blood.  I did send the nail for biopsy, culture.  We will monitor the dark discoloration to make sure it is coming out.  There is any worsening may need to have further biopsy. -Regards to right ankle discussed general rehab exercises for ankle sprains.  Trula Slade DPM

## 2021-07-24 ENCOUNTER — Encounter: Payer: Self-pay | Admitting: Physical Medicine and Rehabilitation
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
## 2021-07-30 ENCOUNTER — Ambulatory Visit: Payer: Medicare Other | Admitting: Podiatry

## 2021-08-02 ENCOUNTER — Ambulatory Visit: Payer: Medicare Other

## 2021-08-02 ENCOUNTER — Ambulatory Visit (INDEPENDENT_AMBULATORY_CARE_PROVIDER_SITE_OTHER): Payer: Medicare Other

## 2021-08-02 DIAGNOSIS — Z0000 Encounter for general adult medical examination without abnormal findings: Secondary | ICD-10-CM | POA: Diagnosis not present

## 2021-08-02 NOTE — Progress Notes (Addendum)
Virtual Visit via Telephone Note  I connected with  Jocelyn Parker on 08/02/21 at  9:30 AM EDT by telephone and verified that I am speaking with the correct person using two identifiers.  Medicare Annual Wellness visit completed telephonically due to Covid-19 pandemic.   Persons participating in this call: This Health Coach and this patient.   Location: Patient: Home Provider: Office   I discussed the limitations, risks, security and privacy concerns of performing an evaluation and management service by telephone and the availability of in person appointments. The patient expressed understanding and agreed to proceed.  Unable to perform video visit due to video visit attempted and failed and/or patient does not have video capability.   Some vital signs may be absent or patient reported.   [name], LPN   Subjective:   Jocelyn Parker is a 70 y.o. female who presents for Medicare Annual (Subsequent) preventive examination.  Review of Systems     Cardiac Risk Factors include: advanced age (>33men, >36 women);dyslipidemia     Objective:    Today's Vitals   08/02/21 0930  PainSc: 4    There is no height or weight on file to calculate BMI.  Advanced Directives 08/02/2021 07/31/2020  Does Patient Have a Medical Advance Directive? Yes Yes  Type of Paramedic of [place];Living will  Copy of [place] in Chart? No - copy requested No - copy requested    Current Medications (verified) Outpatient Encounter Medications as of 08/02/2021  Medication Sig   calcium carbonate (TUMS - DOSED IN MG ELEMENTAL CALCIUM) 500 MG chewable tablet 1 tablet   cyanocobalamin (,VITAMIN B-12,) 1000 MCG/ML injection Inject 1 mL (1,000 mcg total) into the muscle every 21 ( twenty-one) days.   diazepam (VALIUM) 2 MG tablet diazepam 2 mg tablet  TAKE 1 TABLET BY MOUTH EVERY AT BEDTIME FOR VERTIGO   ergocalciferol (VITAMIN D2)  1.25 MG (50000 UT) capsule Take 1 capsule (50,000 Units total) by mouth every 30 (thirty) days.   estradiol (VIVELLE-DOT) 0.1 MG/24HR Place 1 patch (0.1 mg total) onto the skin 2 (two) times a week.   fluticasone (FLONASE) 50 MCG/ACT nasal spray SHAKE LIQUID AND USE 2 SPRAYS IN EACH NOSTRIL DAILY (Patient taking differently: as needed. SHAKE LIQUID AND USE 2 SPRAYS IN EACH NOSTRIL DAILY)   ibuprofen (ADVIL) 800 MG tablet Take 1 tablet (800 mg total) by mouth every 8 (eight) hours as needed. ibuprofen 800 mg tablet   levothyroxine (SYNTHROID) 50 MCG tablet Take 50 mcg by mouth daily before breakfast.   levothyroxine (SYNTHROID, LEVOTHROID) 75 MCG tablet Take 75 mcg by mouth daily.   meclizine (ANTIVERT) 12.5 MG tablet Take 1-2 tablets as needed every 8 hours for dizziness.   Syringe/Needle, Disp, (SYRINGE 3CC/25GX1") 25G X 1" 3 ML MISC One injection monthly   UNABLE TO FIND Med Name: CBD Cream   [DISCONTINUED] calcium carbonate (OS-CAL) 1250 (500 Ca) MG chewable tablet 1 tablet   [DISCONTINUED] tiZANidine (ZANAFLEX) 4 MG tablet Take by mouth.   [DISCONTINUED] Cyanocobalamin (VITAMIN B12) 3000 MCG/ML LIQD See admin instructions.   No facility-administered encounter medications on file as of 08/02/2021.    Allergies (verified) Sulfa antibiotics, Montelukast sodium, and Vitamin d   History: Past Medical History:  Diagnosis Date   Allergy    sulfa   Chronic interstitial cystitis    Lung cancer ([facility])    per patient    Thyroid disease    Past Surgical  History:  Procedure Laterality Date   ABDOMINAL HYSTERECTOMY     lumps removed from breast     1970's ,1990's and in 2000's  nono cancer    LUNG SURGERY  04/05/2020   Family History  Problem Relation Age of Onset   Renal cancer Mother    Heart attack Father    Stroke Maternal Grandmother    Heart attack Paternal Grandfather    Multiple sclerosis Sister    Cancer Brother    Social History   Socioeconomic History   Marital status:  Married    Spouse name: Not on file   Number of children: Not on file   Years of education: Not on file   Highest education level: Not on file  Occupational History   Occupation: Retired  Tobacco Use   Smoking status: Never   Smokeless tobacco: Never  Vaping Use   Vaping Use: Never used  Substance and Sexual Activity   Alcohol use: No   Drug use: No   Sexual activity: Yes    Birth control/protection: Surgical    Comment: hyst  Other Topics Concern   Not on file  Social History Narrative   Not on file   Social Determinants of Health   Financial Resource Strain: Low Risk    Difficulty of Paying Living Expenses: Not hard at all  Food Insecurity: No Food Insecurity   Worried About Charity fundraiser in the Last Year: Never true   [place] in the Last Year: Never true  Transportation Needs: No Transportation Needs   Lack of Transportation (Medical): No   Lack of Transportation (Non-Medical): No  Physical Activity: Insufficiently Active   Days of Exercise per Week: 2 days   Minutes of Exercise per Session: 30 min  Stress: No Stress Concern Present   Feeling of Stress : Not at all  Social Connections: Socially Integrated   Frequency of Communication with Friends and Family: More than three times a week   Frequency of Social Gatherings with Friends and Family: More than three times a week   Attends Religious Services: More than 4 times per year   Active Member of [employer] or Organizations: Yes   Attends Archivist Meetings: 1 to 4 times per year   Marital Status: Married    Tobacco Counseling Counseling given: Not Answered   Clinical Intake:  Pre-visit preparation completed: Yes  Pain : 0-10 Pain Score: 4  Pain Type: Chronic pain Pain Location: Thoracic (and lower back) Pain Descriptors / Indicators: Aching, Radiating Pain Onset: More than a month ago Pain Frequency: Constant     BMI - recorded: 20.44 Nutritional Status: BMI of 19-24   Normal Nutritional Risks: None Diabetes: No  How often do you need to have someone help you when you read instructions, pamphlets, or other written materials from your doctor or pharmacy?: 1 - Never  Diabetic?no  Interpreter Needed?: No  Information entered by :: Charlott Rakes, LPN   Activities of Daily Living In your present state of health, do you have any difficulty performing the following activities: 08/02/2021  Hearing? N  Comment ringing in the ears at times  Vision? N  Difficulty concentrating or making decisions? N  Walking or climbing stairs? N  Dressing or bathing? N  Doing errands, shopping? N  Preparing Food and eating ? N  Using the Toilet? N  In the past six months, have you accidently leaked urine? N  Do you have problems with loss  of bowel control? N  Managing your Medications? N  Managing your Finances? N  Housekeeping or managing your Housekeeping? N  Some recent data might be hidden    Patient Care Team: Pcp, No as PCP - General Jacelyn Pi, MD as Referring Physician (Endocrinology) Ronald Lobo, MD as Consulting Physician (Gastroenterology) Domingo Pulse, MD (Urology) Debara Pickett Nadean Corwin, MD as Consulting Physician (Cardiology) Lavonna Monarch, MD as Consulting Physician (Dermatology)  Indicate any recent Medical Services you may have received from other than [facility] providers in the past year (date may be approximate).     Assessment:   This is a routine wellness examination for [place].  Hearing/Vision screen Hearing Screening - Comments:: Pt stated ringing in ears at time  Vision Screening - Comments:: Pt follows up with Dr Herbert Deaner for annual eye exams   Dietary issues and exercise activities discussed: Current Exercise Habits: Home exercise routine, Time (Minutes): 30, Frequency (Times/Week): 2, Weekly Exercise (Minutes/Week): 60   Goals Addressed             This Visit's Progress    Patient Stated       Patient Stated        Patient would like to increase muscle mass, manage pain        Depression Screen PHQ 2/9 Scores 08/02/2021 08/10/2020 07/31/2020 05/19/2020 03/06/2020 09/09/2019 07/28/2019  PHQ - 2 Score 0 0 0 0 0 0 0  PHQ- 9 Score - - - - - 0 2    Fall Risk Fall Risk  08/02/2021 08/10/2020 07/31/2020 05/19/2020 03/06/2020  Falls in the past year? 0 0 0 0 0  Number falls in past yr: 0 - 0 - -  Injury with Fall? 0 - 0 - -  Risk for fall due to : Impaired vision;Impaired balance/gait No Fall Risks Impaired vision;Impaired balance/gait No Fall Risks No Fall Risks  Risk for fall due to: Comment dizzy spells at times - - - -  Follow up Falls prevention discussed - Falls prevention discussed - -    FALL RISK PREVENTION PERTAINING TO THE HOME:   Any stairs in or around the home? Yes  If so, are there any without handrails? No  Home free of loose throw rugs in walkways, pet beds, electrical cords, etc? Yes  Adequate lighting in your home to reduce risk of falls? Yes   ASSISTIVE DEVICES UTILIZED TO PREVENT FALLS:  Life alert? No  Use of a cane, walker or w/c? No  Grab bars in the bathroom? No  Shower chair or bench in shower? Yes  Elevated toilet seat or a handicapped toilet? No   TIMED UP AND GO:  Was the test performed? No .   Cognitive Function:     6CIT Screen 08/02/2021 07/31/2020  What Year? 0 points 0 points  What month? 0 points 0 points  What time? 0 points -  Count back from 20 2 points 0 points  Months in reverse 0 points 0 points  Repeat phrase 0 points 0 points  Total Score 2 -    Immunizations Immunization History  Administered Date(s) Administered   PFIZER(Purple Top)SARS-COV-2 Vaccination 12/03/2019, 12/31/2019, 08/04/2020, 02/09/2021, 07/21/2021   PPD Test 11/27/2015   Tdap 07/22/2008    TDAP status: Due, Education has been provided regarding the importance of this vaccine. Advised may receive this vaccine at local pharmacy or Health Dept. Aware to provide a copy of the  vaccination record if obtained from local pharmacy or Health Dept. Verbalized acceptance and  understanding.  Flu Vaccine status: Declined, Education has been provided regarding the importance of this vaccine but patient still declined. Advised may receive this vaccine at local pharmacy or Health Dept. Aware to provide a copy of the vaccination record if obtained from local pharmacy or Health Dept. Verbalized acceptance and understanding.  Pneumococcal vaccine status: Declined,  Education has been provided regarding the importance of this vaccine but patient still declined. Advised may receive this vaccine at local pharmacy or Health Dept. Aware to provide a copy of the vaccination record if obtained from local pharmacy or Health Dept. Verbalized acceptance and understanding.   Covid-19 vaccine status: Completed vaccines  Qualifies for Shingles Vaccine? Yes   Zostavax completed No   Shingrix Completed?: No.    Education has been provided regarding the importance of this vaccine. Patient has been advised to call insurance company to determine out of pocket expense if they have not yet received this vaccine. Advised may also receive vaccine at local pharmacy or Health Dept. Verbalized acceptance and understanding.  Screening Tests Health Maintenance  Topic Date Due   Zoster Vaccines- Shingrix (1 of 2) Never done   DEXA SCAN  Never done   TETANUS/TDAP  07/22/2018   MAMMOGRAM  12/05/2021   COLONOSCOPY (Pts 45-28yrs Insurance coverage will need to be confirmed)  12/26/2027   COVID-19 Vaccine  Completed   Hepatitis C Screening  Completed   HPV VACCINES  Aged Out   INFLUENZA VACCINE  Discontinued    Health Maintenance  Health Maintenance Due  Topic Date Due   Zoster Vaccines- Shingrix (1 of 2) Never done   DEXA SCAN  Never done   TETANUS/TDAP  07/22/2018    Colorectal cancer screening: Type of screening: Colonoscopy. Completed 12/25/20. Repeat every 7 years  Mammogram status: Completed  12/06/19. Repeat every year      Additional Screening:  Hepatitis C Screening: Completed 04/05/19  Vision Screening: Recommended annual ophthalmology exams for early detection of glaucoma and other disorders of the eye. Is the patient up to date with their annual eye exam?  Yes  Who is the provider or what is the name of the office in which the patient attends annual eye exams? Dr Herbert Deaner  If pt is not established with a provider, would they like to be referred to a provider to establish care? No .   Dental Screening: Recommended annual dental exams for proper oral hygiene  Community Resource Referral / Chronic Care Management: CRR required this visit?  No   CCM required this visit?  No      Plan:     I have personally reviewed and noted the following in the patient's chart:   Medical and social history Use of alcohol, tobacco or illicit drugs  Current medications and supplements including opioid prescriptions.  Functional ability and status Nutritional status Physical activity Advanced directives List of other physicians Hospitalizations, surgeries, and ER visits in previous 12 months Vitals Screenings to include cognitive, depression, and falls Referrals and appointments  In addition, I have reviewed and discussed with patient certain preventive protocols, quality metrics, and Parker practice recommendations. A written personalized care plan for preventive services as well as general preventive health recommendations were provided to patient.     [name], LPN   6/71/2458   Nurse Notes: pt is concerned about her loss of muscle mass since having lung surgery, she has attempted PT for her back and had difficulty with some of the machine, pt also wold like  to discuss pian management options, she has an upcoming appt with you soon.

## 2021-08-02 NOTE — Patient Instructions (Signed)
Jocelyn Parker , Thank you for taking time to come for your Medicare Wellness Visit. I appreciate your ongoing commitment to your health goals. Please review the following plan we discussed and let me know if I can assist you in the future.   Screening recommendations/referrals: Colonoscopy: Done 12/25/20 repeat every 7 years due 12/26/27 Mammogram: Done 12/06/19 repeat every year Bone Density: pt will get record release signed  Recommended yearly ophthalmology/optometry visit for glaucoma screening and checkup Recommended yearly dental visit for hygiene and checkup  Vaccinations: Influenza vaccine: Declined  Pneumococcal vaccine: Declined  Tdap vaccine: Due and dicussed Shingles vaccine: Shingrix discussed. Please contact your pharmacy for coverage information.    Covid-19:Completed 1/29, 2/26, 08/04/20, 02/09/21 & 07/21/21  Advanced directives: Please bring a copy of your health care power of attorney and living will to the office at your convenience.  Conditions/risks identified: Patient would like to increase muscle mass, manage pain   Next appointment: Follow up in one year for your annual wellness visit    Preventive Care 65 Years and Older, Female Preventive care refers to lifestyle choices and visits with your health care provider that can promote health and wellness. What does preventive care include? A yearly physical exam. This is also called an annual well check. Dental exams once or twice a year. Routine eye exams. Ask your health care provider how often you should have your eyes checked. Personal lifestyle choices, including: Daily care of your teeth and gums. Regular physical activity. Eating a healthy diet. Avoiding tobacco and drug use. Limiting alcohol use. Practicing safe sex. Taking low-dose aspirin every day. Taking vitamin and mineral supplements as recommended by your health care provider. What happens during an annual well check? The services and screenings done by  your health care provider during your annual well check will depend on your age, overall health, lifestyle risk factors, and family history of disease. Counseling  Your health care provider may ask you questions about your: Alcohol use. Tobacco use. Drug use. Emotional well-being. Home and relationship well-being. Sexual activity. Eating habits. History of falls. Memory and ability to understand (cognition). Work and work Statistician. Reproductive health. Screening  You may have the following tests or measurements: Height, weight, and BMI. Blood pressure. Lipid and cholesterol levels. These may be checked every 5 years, or more frequently if you are over 85 years old. Skin check. Lung cancer screening. You may have this screening every year starting at age 62 if you have a 30-pack-year history of smoking and currently smoke or have quit within the past 15 years. Fecal occult blood test (FOBT) of the stool. You may have this test every year starting at age 48. Flexible sigmoidoscopy or colonoscopy. You may have a sigmoidoscopy every 5 years or a colonoscopy every 10 years starting at age 53. Hepatitis C blood test. Hepatitis B blood test. Sexually transmitted disease (STD) testing. Diabetes screening. This is done by checking your blood sugar (glucose) after you have not eaten for a while (fasting). You may have this done every 1-3 years. Bone density scan. This is done to screen for osteoporosis. You may have this done starting at age 29. Mammogram. This may be done every 1-2 years. Talk to your health care provider about how often you should have regular mammograms. Talk with your health care provider about your test results, treatment options, and if necessary, the need for more tests. Vaccines  Your health care provider may recommend certain vaccines, such as: Influenza vaccine. This is  recommended every year. Tetanus, diphtheria, and acellular pertussis (Tdap, Td) vaccine. You  may need a Td booster every 10 years. Zoster vaccine. You may need this after age 93. Pneumococcal 13-valent conjugate (PCV13) vaccine. One dose is recommended after age 31. Pneumococcal polysaccharide (PPSV23) vaccine. One dose is recommended after age 63. Talk to your health care provider about which screenings and vaccines you need and how often you need them. This information is not intended to replace advice given to you by your health care provider. Make sure you discuss any questions you have with your health care provider. Document Released: 11/17/2015 Document Revised: 07/10/2016 Document Reviewed: 08/22/2015 Elsevier Interactive Patient Education  2017 [employer] Prevention in the Home Falls can cause injuries. They can happen to people of all ages. There are many things you can do to make your home safe and to help prevent falls. What can I do on the outside of my home? Regularly fix the edges of walkways and driveways and fix any cracks. Remove anything that might make you trip as you walk through a door, such as a raised step or threshold. Trim any bushes or trees on the path to your home. Use bright outdoor lighting. Clear any walking paths of anything that might make someone trip, such as rocks or tools. Regularly check to see if handrails are loose or broken. Make sure that both sides of any steps have handrails. Any raised decks and porches should have guardrails on the edges. Have any leaves, snow, or ice cleared regularly. Use sand or salt on walking paths during winter. Clean up any spills in your garage right away. This includes oil or grease spills. What can I do in the bathroom? Use night lights. Install grab bars by the toilet and in the tub and shower. Do not use towel bars as grab bars. Use non-skid mats or decals in the tub or shower. If you need to sit down in the shower, use a plastic, non-slip stool. Keep the floor dry. Clean up any water that spills  on the floor as soon as it happens. Remove soap buildup in the tub or shower regularly. Attach bath mats securely with double-sided non-slip rug tape. Do not have throw rugs and other things on the floor that can make you trip. What can I do in the bedroom? Use night lights. Make sure that you have a light by your bed that is easy to reach. Do not use any sheets or blankets that are too big for your bed. They should not hang down onto the floor. Have a firm chair that has side arms. You can use this for support while you get dressed. Do not have throw rugs and other things on the floor that can make you trip. What can I do in the kitchen? Clean up any spills right away. Avoid walking on wet floors. Keep items that you use a lot in easy-to-reach places. If you need to reach something above you, use a strong step stool that has a grab bar. Keep electrical cords out of the way. Do not use floor polish or wax that makes floors slippery. If you must use wax, use non-skid floor wax. Do not have throw rugs and other things on the floor that can make you trip. What can I do with my stairs? Do not leave any items on the stairs. Make sure that there are handrails on both sides of the stairs and use them. Fix handrails that are  broken or loose. Make sure that handrails are as long as the stairways. Check any carpeting to make sure that it is firmly attached to the stairs. Fix any carpet that is loose or worn. Avoid having throw rugs at the top or bottom of the stairs. If you do have throw rugs, attach them to the floor with carpet tape. Make sure that you have a light switch at the top of the stairs and the bottom of the stairs. If you do not have them, ask someone to add them for you. What else can I do to help prevent falls? Wear shoes that: Do not have high heels. Have rubber bottoms. Are comfortable and fit you well. Are closed at the toe. Do not wear sandals. If you use a stepladder: Make  sure that it is fully opened. Do not climb a closed stepladder. Make sure that both sides of the stepladder are locked into place. Ask someone to hold it for you, if possible. Clearly mark and make sure that you can see: Any grab bars or handrails. First and last steps. Where the edge of each step is. Use tools that help you move around (mobility aids) if they are needed. These include: Canes. Walkers. Scooters. Crutches. Turn on the lights when you go into a dark area. Replace any light bulbs as soon as they burn out. Set up your furniture so you have a clear path. Avoid moving your furniture around. If any of your floors are uneven, fix them. If there are any pets around you, be aware of where they are. Review your medicines with your doctor. Some medicines can make you feel dizzy. This can increase your chance of falling. Ask your doctor what other things that you can do to help prevent falls. This information is not intended to replace advice given to you by your health care provider. Make sure you discuss any questions you have with your health care provider. Document Released: 08/17/2009 Document Revised: 03/28/2016 Document Reviewed: 11/25/2014 Elsevier Interactive Patient Education  2017 [employer].

## 2021-08-09 ENCOUNTER — Ambulatory Visit (INDEPENDENT_AMBULATORY_CARE_PROVIDER_SITE_OTHER): Payer: Medicare Other | Admitting: Physician Assistant

## 2021-08-09 ENCOUNTER — Other Ambulatory Visit: Payer: Self-pay

## 2021-08-09 ENCOUNTER — Encounter: Payer: Self-pay | Admitting: Physician Assistant

## 2021-08-09 VITALS — BP 94/56 | HR 72 | Temp 97.4°F | Ht 63.0 in | Wt 116.4 lb

## 2021-08-09 DIAGNOSIS — E782 Mixed hyperlipidemia: Secondary | ICD-10-CM

## 2021-08-09 DIAGNOSIS — M546 Pain in thoracic spine: Secondary | ICD-10-CM

## 2021-08-09 DIAGNOSIS — C3490 Malignant neoplasm of unspecified part of unspecified bronchus or lung: Secondary | ICD-10-CM | POA: Diagnosis not present

## 2021-08-09 DIAGNOSIS — E063 Autoimmune thyroiditis: Secondary | ICD-10-CM

## 2021-08-09 DIAGNOSIS — M6250 Muscle wasting and atrophy, not elsewhere classified, unspecified site: Secondary | ICD-10-CM | POA: Diagnosis not present

## 2021-08-09 LAB — COMPREHENSIVE METABOLIC PANEL
ALT: 13 U/L (ref 0–35)
AST: 19 U/L (ref 0–37)
Albumin: 3.9 g/dL (ref 3.5–5.2)
Alkaline Phosphatase: 57 U/L (ref 39–117)
BUN: 15 mg/dL (ref 6–23)
CO2: 30 mEq/L (ref 19–32)
Calcium: 9 mg/dL (ref 8.4–10.5)
Chloride: 104 mEq/L (ref 96–112)
Creatinine, Ser: 0.8 mg/dL (ref 0.40–1.20)
GFR: 75.64 mL/min (ref >60.00)
Glucose, Bld: 72 mg/dL (ref 70–99)
Potassium: 4.2 mEq/L (ref 3.5–5.1)
Sodium: 138 mEq/L (ref 135–145)
Total Bilirubin: 0.5 mg/dL (ref 0.2–1.2)
Total Protein: 6.7 g/dL (ref 6.0–8.3)

## 2021-08-09 LAB — CBC WITH DIFFERENTIAL/PLATELET
Basophils Absolute: 0 10*3/uL (ref 0.0–0.1)
Basophils Relative: 0.7 % (ref 0.0–3.0)
Eosinophils Absolute: 0 10*3/uL (ref 0.0–0.7)
Eosinophils Relative: 1.4 % (ref 0.0–5.0)
HCT: 39.2 % (ref 36.0–46.0)
Hemoglobin: 13 g/dL (ref 12.0–15.0)
Lymphocytes Relative: 33.7 % (ref 12.0–46.0)
Lymphs Abs: 1.2 10*3/uL (ref 0.7–4.0)
MCHC: 33.1 g/dL (ref 30.0–36.0)
MCV: 93.1 fl (ref 78.0–100.0)
Monocytes Absolute: 0.4 10*3/uL (ref 0.1–1.0)
Monocytes Relative: 11.5 % (ref 3.0–12.0)
Neutro Abs: 1.8 10*3/uL (ref 1.4–7.7)
Neutrophils Relative %: 52.7 % (ref 43.0–77.0)
Platelets: 256 10*3/uL (ref 150.0–400.0)
RBC: 4.21 Mil/uL (ref 3.87–5.11)
RDW: 14 % (ref 11.5–15.5)
WBC: 3.4 10*3/uL — ABNORMAL LOW (ref 4.0–10.5)

## 2021-08-09 LAB — LIPID PANEL
Cholesterol: 243 mg/dL — ABNORMAL HIGH (ref 0–200)
HDL: 57.6 mg/dL (ref >39.00)
LDL Cholesterol: 162 mg/dL — ABNORMAL HIGH (ref 0–99)
NonHDL: 184.97
Total CHOL/HDL Ratio: 4
Triglycerides: 113 mg/dL (ref 0.0–149.0)
VLDL: 22.6 mg/dL (ref 0.0–40.0)

## 2021-08-09 LAB — TSH: TSH: 0.71 u[IU]/mL (ref 0.35–5.50)

## 2021-08-09 LAB — CK: Total CK: 80 U/L (ref 7–177)

## 2021-08-09 LAB — T4, FREE: Free T4: 0.85 ng/dL (ref 0.60–1.60)

## 2021-08-09 MED ORDER — METHOCARBAMOL 500 MG PO TABS: 500.0000 mg | ORAL_TABLET | Freq: Three times a day (TID) | ORAL | 0 refills | Status: AC | PRN

## 2021-08-09 MED ORDER — MELOXICAM 7.5 MG PO TABS: 7.5000 mg | ORAL_TABLET | Freq: Every day | ORAL | 0 refills | Status: DC

## 2021-08-09 NOTE — Patient Instructions (Addendum)
Good to meet you today! Please go to the lab for blood work and I will send results through [facility]. Try the meloxicam for anti-inflammation. Robaxin as needed for muscle cramping. Referral to nutritionist.  Please let me know of your results!
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
## 2021-08-20 ENCOUNTER — Telehealth: Payer: Self-pay | Admitting: Family Medicine

## 2021-08-20 NOTE — Telephone Encounter (Signed)
Patient called stating that she has been having constant "shocking" pain through her thigh since yesterday. She was concerned about this and asking if someone could call her. She said that it is very painful.  Please advise.

## 2021-08-20 NOTE — Telephone Encounter (Signed)
I called Jocelyn Parker back.  Her pain is anterior to medial thigh and intermittent.  Pain does not radiate past the level of the knee.  She thinks he can wait till tomorrow.  Scheduled an appointment with me tomorrow at 9:45 in the morning.

## 2021-08-21 ENCOUNTER — Ambulatory Visit (INDEPENDENT_AMBULATORY_CARE_PROVIDER_SITE_OTHER): Payer: Medicare Other | Admitting: Family Medicine

## 2021-08-21 ENCOUNTER — Other Ambulatory Visit: Payer: Self-pay

## 2021-08-21 ENCOUNTER — Encounter: Payer: Self-pay | Admitting: Family Medicine

## 2021-08-21 VITALS — BP 124/74 | HR 74 | Ht 63.0 in | Wt 117.6 lb

## 2021-08-21 DIAGNOSIS — M79652 Pain in left thigh: Secondary | ICD-10-CM | POA: Diagnosis not present

## 2021-08-21 MED ORDER — GABAPENTIN 100 MG PO CAPS: 100.0000 mg | ORAL_CAPSULE | Freq: Three times a day (TID) | ORAL | 3 refills | Status: DC | PRN

## 2021-08-21 NOTE — Patient Instructions (Addendum)
Good to see you today.  Take the gabapentin as needed for nerve pain.   Recheck as needed.   Keep me updated.

## 2021-08-21 NOTE — Progress Notes (Signed)
I, Peterson Lombard, LAT, ATC acting as a scribe for Lynne Leader, MD.  Jocelyn Parker is a 70 y.o. female who presents to [place] at [facility] today for L thigh pain. Pt was previously seen by Dr. Georgina Snell on 07/13/21 for R-side thoracic chest wall pain and is also seeing Dr. Ernestina Patches for this issue, most recently on 07/18/21.  Pt called the clinic yesterday, 08/20/21, c/o a very painful "shocking" pain along the anterior to medial aspect of her L thigh. Pt c/o thigh pain since 08/19/21 but notes that this is a recurrence. Pt locates pain to her L ant/medial thigh. She notes that the thigh pain is very brief lasting less than 1 second and occur several times per day starting yesterday.  Low back pain: not really LE numbness/tingling: yes in her L foot and toes Aggravating factors: nothing in particular Treatments tried: heat; CBD cream; IBU  Dx imaging: 07/04/21 L-spine & T-spine MRI             03/23/21 T-spine XR             10/19/20 L-spine XR             11/15/11 T-spine MRI             08/13/11 L-spine MRI              Pertinent review of systems: No fevers or chills  Relevant historical information: Chronic thoracic pain unclear etiology thought possibly due to intercostal neuralgia   Exam:  BP 124/74 (BP Location: Right Arm, Patient Position: Sitting, Cuff Size: Normal)   Pulse 74   Ht 5\' 3"  (1.6 m)   Wt 117 lb 9.6 oz (53.3 kg)   SpO2 98%   BMI 20.83 kg/m  General: Well Developed, well nourished, and in no acute distress.   MSK: L-spine normal-appearing Nontender midline. Normal lumbar motion. Lower extremity strength is intact. Negative slump test. Slight decrease or change in sensation left anterior thigh compared to right.    Lab and Radiology Results  EXAM: MRI LUMBAR SPINE WITHOUT CONTRAST   TECHNIQUE: Multiplanar, multisequence MR imaging of the lumbar spine was performed. No intravenous contrast was administered.   COMPARISON:  None.    FINDINGS: Segmentation:  Standard.   Alignment:  Physiologic.   Vertebrae: No acute fracture, evidence of discitis, or aggressive bone lesion.   Conus medullaris and cauda equina: Conus extends to the L1 level. Conus and cauda equina appear normal.   Paraspinal and other soft tissues: No acute paraspinal abnormality. Small right renal cysts.   Disc levels:   Disc spaces: Disc spaces are maintained.   T12-L1: Minimal broad-based disc bulge. No foraminal or central canal stenosis.   L1-L2: No significant disc bulge. No neural foraminal stenosis. No central canal stenosis.   L2-L3: No significant disc bulge. No neural foraminal stenosis. No central canal stenosis.   L3-L4: No significant disc bulge. No neural foraminal stenosis. No central canal stenosis.   L4-L5: Mild broad-based disc bulge with a small central annular fissure. No foraminal or central canal stenosis.   L5-S1: Mild broad-based disc bulge with a small right paracentral disc protrusion contacting the right intraspinal S1 nerve root. Mild bilateral facet arthropathy. No foraminal or central canal stenosis.   IMPRESSION: 1.  No acute osseous injury of the lumbar spine. 2. At L5-S1 there is a mild broad-based disc bulge with a small right paracentral disc protrusion.     Electronically Signed  By: Kathreen Devoid M.D.   On: 07/07/2021 06:39   I, Lynne Leader, personally (independently) visualized and performed the interpretation of the images attached in this note.      Assessment and Plan: 70 y.o. female with left anterior thigh sharp brief pain.  Etiology is unclear however this is very likely nerve related pain.  Fortunately she is not experiencing any weakness or muscle spasm. Plan for watchful waiting with a little bit of gabapentin as needed.  Recheck as needed   PDMP not reviewed this encounter. No orders of the defined types were placed in this encounter.  Meds ordered this encounter   Medications   gabapentin (NEURONTIN) 100 MG capsule    Sig: Take 1-3 capsules (100-300 mg total) by mouth 3 (three) times daily as needed (nerve pain).    Dispense:  90 capsule    Refill:  3     Discussed warning signs or symptoms. Please see discharge instructions. Patient expresses understanding.   The above documentation has been reviewed and is accurate and complete Lynne Leader, M.D.

## 2021-08-23 ENCOUNTER — Encounter: Payer: Self-pay | Admitting: Physician Assistant

## 2021-08-23 ENCOUNTER — Encounter: Payer: Self-pay | Admitting: Physical Medicine and Rehabilitation

## 2021-09-05 ENCOUNTER — Encounter: Payer: Self-pay | Admitting: Physician Assistant

## 2021-09-14 ENCOUNTER — Encounter: Payer: Medicare Other | Admitting: Physician Assistant

## 2021-09-25 ENCOUNTER — Other Ambulatory Visit: Payer: Self-pay | Admitting: Family Medicine

## 2021-09-25 DIAGNOSIS — J302 Other seasonal allergic rhinitis: Secondary | ICD-10-CM

## 2021-09-27 ENCOUNTER — Other Ambulatory Visit: Payer: Self-pay | Admitting: Physician Assistant

## 2021-09-27 DIAGNOSIS — J302 Other seasonal allergic rhinitis: Secondary | ICD-10-CM

## 2021-10-25 NOTE — Progress Notes (Deleted)
Office Visit Note  Patient: Jocelyn Parker             Date of Birth: October 29, 1951           MRN: [id number]             PCP: Fredirick Lathe, PA-C Referring: Orma Flaming, MD Visit Date: 11/08/2021 Occupation: @GUAROCC @  Subjective:  No chief complaint on file.   History of Present Illness: Jocelyn Parker is a 70 y.o. female ***   Activities of Daily Living:  Patient reports morning stiffness for *** {minute/hour:19697}.   Patient {ACTIONS;DENIES/REPORTS:21021675::"Denies"} nocturnal pain.  Difficulty dressing/grooming: {ACTIONS;DENIES/REPORTS:21021675::"Denies"} Difficulty climbing stairs: {ACTIONS;DENIES/REPORTS:21021675::"Denies"} Difficulty getting out of chair: {ACTIONS;DENIES/REPORTS:21021675::"Denies"} Difficulty using hands for taps, buttons, cutlery, and/or writing: {ACTIONS;DENIES/REPORTS:21021675::"Denies"}  No Rheumatology ROS completed.   PMFS History:  Patient Active Problem List   Diagnosis Date Noted   Autoimmune thyroiditis 07/17/2021   Celiac disease 07/17/2021   Impaired fasting glucose 07/17/2021   Non-toxic goiter 07/17/2021   Malignant neoplasm of lung ([place]) 07/11/2021   Post-op pain 05/29/2021   Chest trauma, subsequent encounter 03/29/2021   Vertigo 03/25/2021   Gastritis with intestinal metaplasia of stomach 01/10/2021   S/P partial lobectomy of lung 04/12/2020   Nodule of upper lobe of right lung 04/05/2020   Bilateral breast cysts 12/27/2019   Multiple cysts of breast 12/27/2019   Malignant neoplasm of upper lobe of right lung ([place]) 10/12/2019   Sensorineural hearing loss (SNHL) of both ears 06/20/2019   Adenocarcinoma of lung, stage 1, right ([place]) 05/26/2019   Tinnitus of both ears 05/12/2019   B12 deficiency 04/11/2019   Cyst of ovary 04/01/2019   Osteopenia 04/01/2019   Vitamin D deficiency 05/24/2015   Xanthoma of eyelid 12/21/2014   Cyst of kidney, acquired 12/20/2014   Interstitial cystitis (chronic) without hematuria 03/20/2012    Bilateral fibrocystic breast disease (FCBD) 03/20/2012   Hypothyroidism, followed by Dr. [name] 08/16/2008   Hyperlipidemia, LDL 194, HDL 62 08/16/2008   Shortness of breath 08/16/2008   Nontoxic multinodular goiter 08/08/2007    Past Medical History:  Diagnosis Date   Allergy    sulfa   Chronic interstitial cystitis    Lung cancer ([place])    per patient    Thyroid disease     Family History  Problem Relation Age of Onset   Renal cancer Mother    Heart attack Father    Stroke Maternal Grandmother    Heart attack Paternal Grandfather    Multiple sclerosis Sister    Cancer Brother    Past Surgical History:  Procedure Laterality Date   ABDOMINAL HYSTERECTOMY     lumps removed from breast     1970's ,1990's and in 2000's  nono cancer    LUNG SURGERY  04/05/2020   Social History   Social History Narrative   Not on file   Immunization History  Administered Date(s) Administered   PFIZER(Purple Top)SARS-COV-2 Vaccination 12/03/2019, 12/31/2019, 08/04/2020, 02/09/2021, 07/21/2021   PPD Test 11/27/2015   Tdap 07/22/2008     Objective: Vital Signs: There were no vitals taken for this visit.   Physical Exam   Musculoskeletal Exam: ***  CDAI Exam: CDAI Score: -- Patient Global: --; Provider Global: -- Swollen: --; Tender: -- Joint Exam 11/08/2021   No joint exam has been documented for this visit   There is currently no information documented on the homunculus. Go to the Rheumatology activity and complete the homunculus joint exam.  Investigation: No additional findings.  Imaging: No  results found.  Recent Labs: Lab Results  Component Value Date   WBC 3.4 (L) 08/09/2021   HGB 13.0 08/09/2021   PLT 256.0 08/09/2021   NA 138 08/09/2021   K 4.2 08/09/2021   CL 104 08/09/2021   CO2 30 08/09/2021   GLUCOSE 72 08/09/2021   BUN 15 08/09/2021   CREATININE 0.80 08/09/2021   BILITOT 0.5 08/09/2021   ALKPHOS 57 08/09/2021   AST 19 08/09/2021   ALT 13  08/09/2021   PROT 6.7 08/09/2021   ALBUMIN 3.9 08/09/2021   CALCIUM 9.0 08/09/2021   GFRAA >60 02/04/2016   QFTBGOLDPLUS NEGATIVE 05/26/2019    Speciality Comments: No specialty comments available.  Procedures:  No procedures performed Allergies: Sulfa antibiotics, Montelukast sodium, and Vitamin d   Assessment / Plan:     Visit Diagnoses: No diagnosis found.  Orders: No orders of the defined types were placed in this encounter.  No orders of the defined types were placed in this encounter.   Face-to-face time spent with patient was *** minutes. Greater than 50% of time was spent in counseling and coordination of care.  Follow-Up Instructions: No follow-ups on file.   Earnestine Mealing, CMA  Note - This record has been created using Editor, commissioning.  Chart creation errors have been sought, but may not always  have been located. Such creation errors do not reflect on  the standard of medical care.

## 2021-11-08 ENCOUNTER — Ambulatory Visit: Payer: Medicare Other | Admitting: Rheumatology

## 2021-11-12 ENCOUNTER — Ambulatory Visit (INDEPENDENT_AMBULATORY_CARE_PROVIDER_SITE_OTHER): Payer: Medicare Other | Admitting: Physician Assistant

## 2021-11-12 ENCOUNTER — Encounter: Payer: Self-pay | Admitting: Physician Assistant

## 2021-11-12 ENCOUNTER — Other Ambulatory Visit: Payer: Self-pay

## 2021-11-12 VITALS — BP 106/74 | HR 71 | Temp 97.6°F | Ht 63.0 in | Wt 117.2 lb

## 2021-11-12 DIAGNOSIS — R0789 Other chest pain: Secondary | ICD-10-CM

## 2021-11-12 DIAGNOSIS — G478 Other sleep disorders: Secondary | ICD-10-CM

## 2021-11-12 DIAGNOSIS — M546 Pain in thoracic spine: Secondary | ICD-10-CM

## 2021-11-12 DIAGNOSIS — Z8249 Family history of ischemic heart disease and other diseases of the circulatory system: Secondary | ICD-10-CM

## 2021-11-12 MED ORDER — DICLOFENAC SODIUM 1 % EX GEL: 2.0000 g | Freq: Four times a day (QID) | CUTANEOUS | 3 refills | Status: DC
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
## 2021-11-13 ENCOUNTER — Encounter: Payer: Self-pay | Admitting: Rheumatology

## 2021-11-13 ENCOUNTER — Ambulatory Visit (INDEPENDENT_AMBULATORY_CARE_PROVIDER_SITE_OTHER): Payer: Medicare Other | Admitting: Rheumatology

## 2021-11-13 VITALS — BP 114/71 | HR 76 | Ht 63.0 in | Wt 116.2 lb

## 2021-11-13 DIAGNOSIS — M5136 Other intervertebral disc degeneration, lumbar region: Secondary | ICD-10-CM | POA: Diagnosis not present

## 2021-11-13 DIAGNOSIS — E039 Hypothyroidism, unspecified: Secondary | ICD-10-CM

## 2021-11-13 DIAGNOSIS — M170 Bilateral primary osteoarthritis of knee: Secondary | ICD-10-CM | POA: Diagnosis not present

## 2021-11-13 DIAGNOSIS — N281 Cyst of kidney, acquired: Secondary | ICD-10-CM

## 2021-11-13 DIAGNOSIS — E559 Vitamin D deficiency, unspecified: Secondary | ICD-10-CM

## 2021-11-13 DIAGNOSIS — H903 Sensorineural hearing loss, bilateral: Secondary | ICD-10-CM

## 2021-11-13 DIAGNOSIS — C3491 Malignant neoplasm of unspecified part of right bronchus or lung: Secondary | ICD-10-CM

## 2021-11-13 DIAGNOSIS — R768 Other specified abnormal immunological findings in serum: Secondary | ICD-10-CM

## 2021-11-13 DIAGNOSIS — M791 Myalgia, unspecified site: Secondary | ICD-10-CM

## 2021-11-13 DIAGNOSIS — N6011 Diffuse cystic mastopathy of right breast: Secondary | ICD-10-CM

## 2021-11-13 DIAGNOSIS — Z902 Acquired absence of lung [part of]: Secondary | ICD-10-CM

## 2021-11-13 DIAGNOSIS — E042 Nontoxic multinodular goiter: Secondary | ICD-10-CM

## 2021-11-13 DIAGNOSIS — I7300 Raynaud's syndrome without gangrene: Secondary | ICD-10-CM

## 2021-11-13 DIAGNOSIS — N6012 Diffuse cystic mastopathy of left breast: Secondary | ICD-10-CM

## 2021-11-13 DIAGNOSIS — N3010 Interstitial cystitis (chronic) without hematuria: Secondary | ICD-10-CM

## 2021-11-13 DIAGNOSIS — H0260 Xanthelasma of unspecified eye, unspecified eyelid: Secondary | ICD-10-CM

## 2021-11-13 DIAGNOSIS — M8580 Other specified disorders of bone density and structure, unspecified site: Secondary | ICD-10-CM

## 2021-11-13 DIAGNOSIS — E538 Deficiency of other specified B group vitamins: Secondary | ICD-10-CM

## 2021-11-13 DIAGNOSIS — E782 Mixed hyperlipidemia: Secondary | ICD-10-CM

## 2021-11-15 LAB — SEDIMENTATION RATE: Sed Rate: 14 mm/h (ref 0–30)

## 2021-11-15 LAB — ANA: Anti Nuclear Antibody (ANA): POSITIVE — AB

## 2021-11-15 LAB — ANTI-NUCLEAR AB-TITER (ANA TITER)
ANA TITER: 1:320 {titer} — ABNORMAL HIGH
ANA Titer 1: 1:160 {titer} — ABNORMAL HIGH

## 2021-11-15 LAB — C3 AND C4
C3 Complement: 131 mg/dL (ref 83–193)
C4 Complement: 28 mg/dL (ref 15–57)

## 2021-11-15 LAB — ANTI-DNA ANTIBODY, DOUBLE-STRANDED: ds DNA Ab: 1 IU/mL

## 2021-11-15 NOTE — Patient Instructions (Signed)
Please work on the following to help with sleep:  -Sleep only long enough to feel rested then get out of bed -Go to bed and get up at the same time every day. -Do not try to force yourself to sleep. If you can't sleep, get out of bed adn try again later. -Have coffee, tea, and other foods that have caffeine only in the morning. -Avoid alcohol -Keep your bedroom dark, cool, quiet, and free of reminders of work or other things that cause you stress -Exercise several days a week, but not right before bed -Avoid looking at phones or reading devices ("e-books") that give off light before bed. This can make it harder to fall asleep  

## 2021-11-16 NOTE — Progress Notes (Signed)
ANA is positive and low titer.  Double-stranded ENA, complements and sed rate are normal.  No change in treatment advised.

## 2021-11-19 ENCOUNTER — Telehealth: Payer: Self-pay | Admitting: Internal Medicine

## 2021-11-19 NOTE — Telephone Encounter (Signed)
Marcellus with me -I have only seen her for lipid management in the past.  Dr Lemmie Evens

## 2021-11-19 NOTE — Telephone Encounter (Signed)
Patient requesting to switch from Dr. Debara Pickett or Dr. Ali Lowe.

## 2021-12-04 NOTE — Progress Notes (Deleted)
Cardiology Office Note:    Date:  12/04/2021   ID:  Jocelyn Parker, DOB 06-24-1951, MRN [id number]  PCP:  Allwardt, Randa Evens, PA-C   [facility] Providers Cardiologist:  Lenna Sciara, MD Referring MD: Fredirick Lathe, PA-C   Chief Complaint/Reason for Referral: Establish cardiovascular care.  ASSESSMENT:    Mixed hyperlipidemia  Coronary artery calcification of native artery    PLAN:    In order of problems listed above:  1.  Given the presence of coronary artery calcium and her LDL above goal in conjunction with her statin intolerance will refer to pharmacy division for further recommendations.  PCSK9 inhibitor may be in order.  2.  She should be on aspirin 81 mg as well as on an intensive lipid lowering regimen given her family history.          {Are you ordering a CV Procedure (e.g. stress test, cath, DCCV, TEE, etc)?   Press F2        :[id number]}   Dispo:  No follow-ups on file.     Medication Adjustments/Labs and Tests Ordered: Current medicines are reviewed at length with the patient today.  Concerns regarding medicines are outlined above.   Tests Ordered: No orders of the defined types were placed in this encounter.   Medication Changes: No orders of the defined types were placed in this encounter.   History of Present Illness:    FOCUSED CARDIOVASCULAR PROBLEM LIST:   Possible familial hyperlipidemia-Dutch score of 4 Abnormal CAC score 45 (01/2020) Family history of coronary disease on her father side Statin intolerance-myalgias  The patient is a 71 y.o. female with the indicated medical history here to establish cardiovascular care.  The patient had been seen within the last few years by Dr. Debara Pickett for recommendations regarding lipid management.  She last saw Dr. Debara Pickett in October 2021.  He had recommended starting Zetia or potentially a PCSK9 inhibitor.  She wanted to try diet and exercise.  Lipids done approximately 1 year later demonstrated  total cholesterol 243, triglycerides 113, HDL 57, and LDL 162.  Notably her LFTs and creatinine are within normal limits.        Previous Medical History: Past Medical History:  Diagnosis Date   Allergy    sulfa   Chronic interstitial cystitis    Lung cancer ([place])    per patient    Thyroid disease      Current Medications: No outpatient medications have been marked as taking for the 12/06/21 encounter (Appointment) with Early Osmond, MD.     Allergies:    Sulfa antibiotics, Montelukast sodium, and Vitamin d   Social History:   Social History   Tobacco Use   Smoking status: Never   Smokeless tobacco: Never  Vaping Use   Vaping Use: Never used  Substance Use Topics   Alcohol use: No   Drug use: No     Family Hx: Family History  Problem Relation Age of Onset   Renal cancer Mother    Heart attack Father    Stroke Maternal Grandmother    Heart attack Paternal Grandfather    Multiple sclerosis Sister    Cancer Brother      Review of Systems:   Please see the history of present illness.    All other systems reviewed and are negative.     EKGs/Labs/Other Test Reviewed:    EKG:  *** EKG from 2020 demonstrates sinus rhythm  Prior CV studies: None available  Imaging studies that  I have independently reviewed today: None relevant  Recent Labs: 08/09/2021: ALT 13; BUN 15; Creatinine, Ser 0.80; Hemoglobin 13.0; Platelets 256.0; Potassium 4.2; Sodium 138; TSH 0.71   Recent Lipid Panel Lab Results  Component Value Date/Time   CHOL 243 (H) 08/09/2021 09:58 AM   CHOL 245 (H) 07/28/2020 02:33 PM   TRIG 113.0 08/09/2021 09:58 AM   HDL 57.60 08/09/2021 09:58 AM   HDL 52 07/28/2020 02:33 PM   LDLCALC 162 (H) 08/09/2021 09:58 AM   LDLCALC 169 (H) 07/28/2020 02:33 PM    Risk Assessment/Calculations:    {Does this patient have ATRIAL FIBRILLATION?[phone]}      Physical Exam:    VS:  There were no vitals taken for this visit.   Wt Readings from Last 3  Encounters:  11/13/21 116 lb 3.2 oz (52.7 kg)  11/12/21 117 lb 3.2 oz (53.2 kg)  08/21/21 117 lb 9.6 oz (53.3 kg)    GENERAL:  No apparent distress, AOx3 HEENT:  No carotid bruits, +2 carotid impulses, no scleral icterus CAR: RRR Irregular RR*** no murmurs***, gallops, rubs, or thrills RES:  Clear to auscultation bilaterally ABD:  Soft, nontender, nondistended, positive bowel sounds x 4 VASC:  +2 radial pulses, +2 carotid pulses, palpable pedal pulses NEURO:  CN 2-12 grossly intact; motor and sensory grossly intact PSYCH:  No active depression or anxiety EXT:  No edema, ecchymosis, or cyanosis  Signed, Early Osmond, MD  12/04/2021 1:32 PM    [facility] [address], [place], [place]  [address] Phone: [phone]; Fax: [phone]   Note:  This document was prepared using Dragon voice recognition software and may include unintentional dictation errors.

## 2021-12-06 ENCOUNTER — Ambulatory Visit: Payer: Medicare Other | Admitting: Internal Medicine
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
## 2021-12-07 ENCOUNTER — Other Ambulatory Visit: Payer: Self-pay | Admitting: *Deleted

## 2021-12-07 ENCOUNTER — Other Ambulatory Visit: Payer: Self-pay

## 2021-12-07 ENCOUNTER — Ambulatory Visit (INDEPENDENT_AMBULATORY_CARE_PROVIDER_SITE_OTHER): Payer: Medicare Other | Admitting: Internal Medicine

## 2021-12-07 ENCOUNTER — Encounter: Payer: Self-pay | Admitting: Internal Medicine

## 2021-12-07 VITALS — BP 114/82 | HR 82 | Ht 63.5 in | Wt 115.0 lb

## 2021-12-07 DIAGNOSIS — I2510 Atherosclerotic heart disease of native coronary artery without angina pectoris: Secondary | ICD-10-CM

## 2021-12-07 DIAGNOSIS — R0600 Dyspnea, unspecified: Secondary | ICD-10-CM | POA: Diagnosis not present

## 2021-12-07 DIAGNOSIS — R072 Precordial pain: Secondary | ICD-10-CM | POA: Diagnosis not present

## 2021-12-07 DIAGNOSIS — E7849 Other hyperlipidemia: Secondary | ICD-10-CM

## 2021-12-07 DIAGNOSIS — I2584 Coronary atherosclerosis due to calcified coronary lesion: Secondary | ICD-10-CM

## 2021-12-07 MED ORDER — ATORVASTATIN CALCIUM 40 MG PO TABS: 40.0000 mg | ORAL_TABLET | Freq: Every day | ORAL | 3 refills | Status: DC

## 2021-12-07 MED ORDER — ASPIRIN EC 81 MG PO TBEC: 81.0000 mg | DELAYED_RELEASE_TABLET | Freq: Every day | ORAL | 3 refills | Status: DC

## 2021-12-07 NOTE — Patient Instructions (Signed)
Medication Instructions:  START ASPIRIN 81 MG EVERY DAY  ATORVASTATIN 40 MG TAKE AT BEDTIME  *If you need a refill on your cardiac medications before your next appointment, please call your pharmacy*   Lab Work: FASTING LIPID AND LIVER  DUE FIRST OF April  If you have labs (blood work) drawn today and your tests are completely normal, you will receive your results only by: [place] (if you have MyChart) OR A paper copy in the mail If you have any lab test that is abnormal or we need to change your treatment, we will call you to review the results.   Testing/Procedures: NONE   Follow-Up: At [facility], you and your health needs are our priority.  As part of our continuing mission to provide you with exceptional heart care, we have created designated Provider Care Teams.  These Care Teams include your primary Cardiologist (physician) and Advanced Practice Providers (APPs -  Physician Assistants and Nurse Practitioners) who all work together to provide you with the care you need, when you need it.  We recommend signing up for the patient portal called "MyChart".  Sign up information is provided on this After Visit Summary.  MyChart is used to connect with patients for Virtual Visits (Telemedicine).  Patients are able to view lab/test results, encounter notes, upcoming appointments, etc.  Non-urgent messages can be sent to your provider as well.   To learn more about what you can do with MyChart, go to [email or web address].    Your next appointment:   6 month(s)  The format for your next appointment:   In Person  Provider:  DR Ali Lowe DR D     :1}    Other Instructions REFERRAL TO [place]

## 2021-12-19 ENCOUNTER — Encounter (HOSPITAL_COMMUNITY): Payer: Self-pay

## 2021-12-19 ENCOUNTER — Ambulatory Visit (HOSPITAL_COMMUNITY): Payer: Medicare Other | Attending: Internal Medicine

## 2021-12-24 ENCOUNTER — Telehealth (HOSPITAL_COMMUNITY): Payer: Self-pay | Admitting: Internal Medicine

## 2021-12-24 NOTE — Telephone Encounter (Signed)
Patient called concerned that she will receive a bill for the "NO SHOW ECHOCARDIOGRAM" that was scheduled on 12/19/21. Patient arrived 6 minutes past appt time and was marked a NO SHOW status. Patient states that someone told her that the [facility] had just came out and called her name but was refused to do echo at that time.  I explained to patient that when we schedule the appointments for the test that we ask patient to arrive 15 mins prior to appt and she states she had already done her E-check in. I explained to her that we appreciated the E check-in but we stil have long lines at the check in and need the arrival still 15 mins prior to appt and I did call and speak with the patient on 12/18/21 @ 2:24 pm and early arrival was noted and documented. I told patient that if she received the bill for $50.00 she could then call the Billing Dept and dispute the charge. She then asked why she had to wait til bill received.. I explained that there was not a charge on the account at this time for the NO SHOW Fee.  She then asked to Whom she was speaking nd I gave her my name and title. She then ended the call with hoping that I had a great day.

## 2021-12-25 ENCOUNTER — Other Ambulatory Visit: Payer: Self-pay

## 2021-12-25 ENCOUNTER — Ambulatory Visit: Payer: Medicare Other | Admitting: Internal Medicine

## 2021-12-25 ENCOUNTER — Ambulatory Visit (INDEPENDENT_AMBULATORY_CARE_PROVIDER_SITE_OTHER): Payer: Medicare Other

## 2021-12-25 DIAGNOSIS — R0600 Dyspnea, unspecified: Secondary | ICD-10-CM

## 2021-12-25 LAB — ECHOCARDIOGRAM COMPLETE
AR max vel: 1.94 cm2
AV Area VTI: 2.11 cm2
AV Area mean vel: 2.21 cm2
AV Mean grad: 2 mmHg
AV Peak grad: 3.8 mmHg
Ao pk vel: 0.98 m/s
Area-P 1/2: 3.26 cm2
S' Lateral: 2.6 cm

## 2022-01-07 ENCOUNTER — Emergency Department (HOSPITAL_BASED_OUTPATIENT_CLINIC_OR_DEPARTMENT_OTHER)
Admission: EM | Admit: 2022-01-07 | Discharge: 2022-01-07 | Disposition: A | Discharge status: Home / Self Care | Payer: Medicare Other | Attending: Emergency Medicine | Admitting: Emergency Medicine

## 2022-01-07 ENCOUNTER — Encounter (HOSPITAL_BASED_OUTPATIENT_CLINIC_OR_DEPARTMENT_OTHER): Payer: Self-pay | Admitting: Emergency Medicine

## 2022-01-07 ENCOUNTER — Other Ambulatory Visit: Payer: Self-pay

## 2022-01-07 ENCOUNTER — Emergency Department (HOSPITAL_BASED_OUTPATIENT_CLINIC_OR_DEPARTMENT_OTHER): Payer: Medicare Other

## 2022-01-07 ENCOUNTER — Other Ambulatory Visit (HOSPITAL_BASED_OUTPATIENT_CLINIC_OR_DEPARTMENT_OTHER): Payer: Self-pay

## 2022-01-07 DIAGNOSIS — R109 Unspecified abdominal pain: Secondary | ICD-10-CM | POA: Insufficient documentation

## 2022-01-07 DIAGNOSIS — R110 Nausea: Secondary | ICD-10-CM | POA: Diagnosis not present

## 2022-01-07 DIAGNOSIS — H9319 Tinnitus, unspecified ear: Secondary | ICD-10-CM | POA: Insufficient documentation

## 2022-01-07 DIAGNOSIS — Z7982 Long term (current) use of aspirin: Secondary | ICD-10-CM | POA: Diagnosis not present

## 2022-01-07 DIAGNOSIS — R42 Dizziness and giddiness: Secondary | ICD-10-CM | POA: Insufficient documentation

## 2022-01-07 LAB — CBC
HCT: 43 % (ref 36.0–46.0)
Hemoglobin: 13.9 g/dL (ref 12.0–15.0)
MCH: 30.5 pg (ref 26.0–34.0)
MCHC: 32.3 g/dL (ref 30.0–36.0)
MCV: 94.3 fL (ref 80.0–100.0)
Platelets: 278 10*3/uL (ref 150–400)
RBC: 4.56 MIL/uL (ref 3.87–5.11)
RDW: 14.1 % (ref 11.5–15.5)
WBC: 5.3 10*3/uL (ref 4.0–10.5)
nRBC: 0 % (ref 0.0–0.2)

## 2022-01-07 LAB — URINALYSIS, ROUTINE W REFLEX MICROSCOPIC
Bilirubin Urine: NEGATIVE
Glucose, UA: NEGATIVE mg/dL
Hgb urine dipstick: NEGATIVE
Ketones, ur: NEGATIVE mg/dL
Leukocytes,Ua: NEGATIVE
Nitrite: NEGATIVE
Protein, ur: NEGATIVE mg/dL
Specific Gravity, Urine: 1.017 (ref 1.005–1.030)
pH: 6 (ref 5.0–8.0)

## 2022-01-07 LAB — BASIC METABOLIC PANEL
Anion gap: 6 (ref 5–15)
BUN: 18 mg/dL (ref 8–23)
CO2: 30 mmol/L (ref 22–32)
Calcium: 9.4 mg/dL (ref 8.9–10.3)
Chloride: 102 mmol/L (ref 98–111)
Creatinine, Ser: 0.82 mg/dL (ref 0.44–1.00)
GFR, Estimated: >60 mL/min (ref >60)
Glucose, Bld: 91 mg/dL (ref 70–99)
Potassium: 3.9 mmol/L (ref 3.5–5.1)
Sodium: 138 mmol/L (ref 135–145)

## 2022-01-07 LAB — HEPATIC FUNCTION PANEL
ALT: 10 U/L (ref 0–44)
AST: 13 U/L — ABNORMAL LOW (ref 15–41)
Albumin: 4.1 g/dL (ref 3.5–5.0)
Alkaline Phosphatase: 51 U/L (ref 38–126)
Bilirubin, Direct: 0.1 mg/dL (ref 0.0–0.2)
Indirect Bilirubin: 0.5 mg/dL (ref 0.3–0.9)
Total Bilirubin: 0.6 mg/dL (ref 0.3–1.2)
Total Protein: 7.2 g/dL (ref 6.5–8.1)

## 2022-01-07 LAB — LIPASE, BLOOD: Lipase: 23 U/L (ref 11–51)

## 2022-01-07 MED ORDER — SODIUM CHLORIDE 0.9 % IV BOLUS
1000.0000 mL | Freq: Once | INTRAVENOUS | Status: AC
  Administered 2022-01-07: 1000 mL via INTRAVENOUS

## 2022-01-07 MED ORDER — ONDANSETRON HCL 4 MG/2ML IJ SOLN
4.0000 mg | Freq: Once | INTRAMUSCULAR | Status: AC
  Administered 2022-01-07: 4 mg via INTRAVENOUS
  Filled 2022-01-07: qty 2

## 2022-01-07 MED ORDER — IOHEXOL 350 MG/ML SOLN
100.0000 mL | Freq: Once | INTRAVENOUS | Status: AC | PRN
  Administered 2022-01-07: 60 mL via INTRAVENOUS

## 2022-01-07 MED ORDER — MECLIZINE HCL 25 MG PO TABS
25.0000 mg | ORAL_TABLET | Freq: Three times a day (TID) | ORAL | 0 refills | Status: DC | PRN
  Filled 2022-01-07: qty 30, 10d supply, fill #0

## 2022-01-07 NOTE — ED Triage Notes (Signed)
Dizziness since Saturday. Taking meclizine without relief. Also reports generalized abd pain with nausea.  ?

## 2022-01-07 NOTE — ED Provider Notes (Signed)
[facility] EMERGENCY DEPT Provider Note   CSN: [id number] Arrival date & time: 01/07/22  0935     History  Chief Complaint  Patient presents with   Dizziness   Abdominal Pain    Melane Windholz is a 71 y.o. female.  She has a history of lung cancer tinnitus vertigo.  Complaining of acute onset of dizziness motion sickness that started 2 days ago at 3 AM when she was getting up to go to the bathroom.  Makes her feel nauseous.  Tried meclizine without improvement.  She said she has not had vertigo for a long time.  No blurry vision double vision.  No numbness or weakness.  Symptoms are worse with moving her eyes or head position or getting up from sitting to standing.  No chest pain or shortness of breath.  No new medications.  Today she noticed some abdominal cramping although she states she has not been eating much.  The history is provided by the patient.  Dizziness Quality:  Imbalance and head spinning Severity:  Moderate Onset quality:  Sudden Duration:  2 days Timing:  Intermittent Progression:  Unchanged Chronicity:  New Context: eye movement, head movement and standing up   Relieved by:  Nothing Worsened by:  Movement, turning head and eye movement Ineffective treatments:  Medication Associated symptoms: nausea and tinnitus   Associated symptoms: no blood in stool, no chest pain, no diarrhea, no headaches, no shortness of breath, no syncope, no vision changes and no vomiting   Abdominal Pain Associated symptoms: nausea   Associated symptoms: no chest pain, no diarrhea, no dysuria, no fever, no shortness of breath and no vomiting       Home Medications Prior to Admission medications   Medication Sig Start Date End Date Taking? Authorizing Provider  aspirin EC 81 MG tablet Take 1 tablet (81 mg total) by mouth daily. Swallow whole. 12/07/21   Early Osmond, MD  atorvastatin (LIPITOR) 40 MG tablet Take 1 tablet (40 mg total) by mouth daily. 12/07/21 03/07/22   Early Osmond, MD  calcium carbonate (TUMS - DOSED IN MG ELEMENTAL CALCIUM) 500 MG chewable tablet 1 tablet    [provider]  cyanocobalamin (,VITAMIN B-12,) 1000 MCG/ML injection Inject 1 mL (1,000 mcg total) into the muscle every 21 ( twenty-one) days. 03/23/21   Orma Flaming, MD  diclofenac Sodium (VOLTAREN) 1 % GEL Apply 2 g topically 4 (four) times daily. 11/12/21   Allwardt, Randa Evens, PA-C  ergocalciferol (VITAMIN D2) 1.25 MG (50000 UT) capsule Take 1 capsule (50,000 Units total) by mouth every 30 (thirty) days. 03/23/21   Orma Flaming, MD  estradiol (VIVELLE-DOT) 0.1 MG/24HR Place 1 patch (0.1 mg total) onto the skin 2 (two) times a week. 03/21/12   Haygood, Seymour Bars, MD  fluticasone (FLONASE) 50 MCG/ACT nasal spray SHAKE LIQUID AND USE 2 SPRAYS IN EACH NOSTRIL DAILY Patient taking differently: as needed. SHAKE LIQUID AND USE 2 SPRAYS IN EACH NOSTRIL DAILY 09/26/21   Allwardt, Alyssa M, PA-C  ibuprofen (ADVIL) 800 MG tablet Take 1 tablet (800 mg total) by mouth every 8 (eight) hours as needed. ibuprofen 800 mg tablet 01/24/21   Orma Flaming, MD  levothyroxine (SYNTHROID) 50 MCG tablet Take 50 mcg by mouth daily before breakfast.    [provider]  levothyroxine (SYNTHROID, LEVOTHROID) 75 MCG tablet Take 75 mcg by mouth daily.    [provider]  meclizine (ANTIVERT) 12.5 MG tablet Take 1-2 tablets as needed every 8 hours  for dizziness. 03/23/21   Orma Flaming, MD  Syringe/Needle, Disp, (SYRINGE 3CC/25GX1") 25G X 1" 3 ML MISC One injection monthly 09/11/20   Orma Flaming, MD  UNABLE TO FIND Med Name: CBD Cream    [provider]      Allergies    Sulfa antibiotics, Montelukast sodium, and Vitamin d    Review of Systems   Review of Systems  Constitutional:  Negative for fever.  HENT:  Positive for tinnitus.   Eyes:  Negative for visual disturbance.  Respiratory:  Negative for shortness of breath.   Cardiovascular:  Negative for chest pain and  syncope.  Gastrointestinal:  Positive for abdominal pain and nausea. Negative for blood in stool, diarrhea and vomiting.  Genitourinary:  Negative for dysuria.  Skin:  Negative for rash.  Neurological:  Positive for dizziness and light-headedness. Negative for headaches.   Physical Exam Updated Vital Signs BP 112/77 (BP Location: Left Arm)    Pulse 79    Temp 97.6 F (36.4 C)    Resp 16    Ht 5\' 3"  (1.6 m)    Wt 50.3 kg    SpO2 100%    BMI 19.66 kg/m  Physical Exam Vitals and nursing note reviewed.  Constitutional:      General: She is not in acute distress.    Appearance: She is well-developed.  HENT:     Head: Normocephalic and atraumatic.  Eyes:     Extraocular Movements: Extraocular movements intact.     Conjunctiva/sclera: Conjunctivae normal.     Pupils: Pupils are equal, round, and reactive to light.     Comments: She does have some horizontal nystagmus  Cardiovascular:     Rate and Rhythm: Normal rate and regular rhythm.     Heart sounds: No murmur heard. Pulmonary:     Effort: Pulmonary effort is normal. No respiratory distress.     Breath sounds: Normal breath sounds.  Abdominal:     Palpations: Abdomen is soft.     Tenderness: There is no abdominal tenderness.  Musculoskeletal:        General: No swelling. Normal range of motion.     Cervical back: Neck supple.     Right lower leg: No edema.     Left lower leg: No edema.  Skin:    General: Skin is warm and dry.     Capillary Refill: Capillary refill takes less than 2 seconds.  Neurological:     General: No focal deficit present.     Mental Status: She is alert and oriented to person, place, and time.     Cranial Nerves: No cranial nerve deficit.     Sensory: No sensory deficit.     Motor: No weakness.     Gait: Gait normal.    ED Results / Procedures / Treatments   Labs (all labs ordered are listed, but only abnormal results are displayed) Labs Reviewed  HEPATIC FUNCTION PANEL - Abnormal; Notable for  the following components:      Result Value   AST 13 (*)    All other components within normal limits  BASIC METABOLIC PANEL  CBC  URINALYSIS, ROUTINE W REFLEX MICROSCOPIC  LIPASE, BLOOD    EKG EKG Interpretation  Date/Time:  Monday January 07 2022 10:18:21 EST Ventricular Rate:  83 PR Interval:  140 QRS Duration: 74 QT Interval:  352 QTC Calculation: 413 R Axis:   34 Text Interpretation: Normal sinus rhythm Normal ECG No significant change since prior 6/12 Confirmed  by Aletta Edouard [phone]) on 01/07/2022 10:21:14 AM  Radiology CT ANGIO HEAD NECK W WO CM  Result Date: 01/07/2022 CLINICAL DATA:  Dizziness, nausea, concern for stroke EXAM: CT ANGIOGRAPHY HEAD AND NECK TECHNIQUE: Multidetector CT imaging of the head and neck was performed using the standard protocol during bolus administration of intravenous contrast. Multiplanar CT image reconstructions and MIPs were obtained to evaluate the vascular anatomy. Carotid stenosis measurements (when applicable) are obtained utilizing NASCET criteria, using the distal internal carotid diameter as the denominator. RADIATION DOSE REDUCTION: This exam was performed according to the departmental dose-optimization program which includes automated exposure control, adjustment of the mA and/or kV according to patient size and/or use of iterative reconstruction technique. CONTRAST:  35mL OMNIPAQUE IOHEXOL 350 MG/ML SOLN COMPARISON:  CT head 04/08/2019 FINDINGS: CT HEAD FINDINGS Brain: There is no evidence of acute intracranial hemorrhage, extra-axial fluid collection, or acute infarct. Background parenchymal volume is normal. The ventricles are normal in size. Gray-white differentiation is preserved. There is no significant burden of white matter microangiopathic change by CT. There is no mass lesion or midline shift. Vascular: See below. Skull: Normal. Negative for fracture or focal lesion. Sinuses: The paranasal sinuses are clear. Orbits: The globes and  orbits are unremarkable. Review of the MIP images confirms the above findings CTA NECK FINDINGS Aortic arch: There is mild calcified atherosclerotic plaque of the aortic arch. The origins of the major branch vessels are patent. Subclavian arteries are patent. Right carotid system: The right common, internal, and external carotid arteries are patent, without hemodynamically significant stenosis or occlusion. There is no dissection or aneurysm. Left carotid system: The left common, internal, and external carotid arteries are patent, without hemodynamically significant stenosis or occlusion. There is no dissection or aneurysm. Vertebral arteries: The vertebral arteries are patent, without hemodynamically significant stenosis or occlusion. There is no dissection or aneurysm. Skeleton: There is mild degenerative change at C5-C6. There is no acute osseous abnormality or aggressive osseous lesion. There is no visible canal hematoma. Other neck: The soft tissues are unremarkable. Upper chest: Suture material is seen in the right apex. The imaged lung apices are otherwise clear. Review of the MIP images confirms the above findings CTA HEAD FINDINGS Anterior circulation: The intracranial ICAs are patent with minimal plaque on the left. The bilateral MCAs are patent. The bilateral ACAs are patent. The anterior communicating artery is not definitely seen. There is no aneurysm or AVM. Posterior circulation: The bilateral V4 segments are patent. PICA is identified bilaterally. The basilar artery is patent. The bilateral PCAs are patent. There is a fetal origin of the left PCA. The right posterior communicating artery is also identified. There is no aneurysm or AVM. Venous sinuses: Patent. Anatomic variants: As above. Review of the MIP images confirms the above findings IMPRESSION: 1. No acute intracranial pathology. 2. Patent vasculature of the head and neck with no significant stenosis or occlusion. Aortic Atherosclerosis  (ICD10-I70.0). Electronically Signed   By: Valetta Mole M.D.   On: 01/07/2022 13:30    Procedures Procedures    Medications Ordered in ED Medications  sodium chloride 0.9 % bolus 1,000 mL (0 mLs Intravenous Stopped 01/07/22 1436)  ondansetron (ZOFRAN) injection 4 mg (4 mg Intravenous Given 01/07/22 1229)  iohexol (OMNIPAQUE) 350 MG/ML injection 100 mL (60 mLs Intravenous Contrast Given 01/07/22 1251)    ED Course/ Medical Decision Making/ A&P  Medical Decision Making Amount and/or Complexity of Data Reviewed Labs: ordered. Radiology: ordered.  Risk Prescription drug management.  This patient complains of dizziness lightheadedness and balance; this involves an extensive number of treatment Options and is a complaint that carries with it a high risk of complications and morbidity. The differential includes stroke, bleed, vertigo, inner ear infection, cerumen impaction, dehydration  I ordered, reviewed and interpreted labs, which included CBC with normal white count normal hemoglobin, chemistries normal, LFTs normal, urinalysis negative I ordered medication IV fluids and Zofran with improvement in her symptoms and reviewed PMP when indicated. I ordered imaging studies which included CT angio head and neck and I independently    visualized and interpreted imaging which showed no acute findings Additional history obtained from patient significant other Previous records obtained and reviewed in epic no recent admissions Cardiac monitoring reviewed, patient in normal sinus rhythm Social determinants considered, no significant barriers Critical Interventions: None  After the interventions stated above, I reevaluated the patient and found patient to be symptomatically improved. Admission and further testing considered, no indications for MRI or further testing at this time.  Recommended close follow-up with PCP and will update prescription for meclizine.  Return  instructions discussed          Final Clinical Impression(s) / ED Diagnoses Final diagnoses:  Dizziness  Abdominal cramps    Rx / DC Orders ED Discharge Orders          Ordered    meclizine (ANTIVERT) 25 MG tablet  3 times daily PRN        01/07/22 1409              Hayden Rasmussen, MD 01/07/22 1751

## 2022-01-07 NOTE — ED Notes (Signed)
Patient verbalizes understanding of discharge instructions. Opportunity for questioning and answers were provided. Patient discharged from ED.  °

## 2022-01-07 NOTE — Discharge Instructions (Signed)
You were seen in the emergency department for dizziness and feeling motion sick.  You had blood work EKG urinalysis and a CAT scan of your head and neck that did not show an obvious explanation for your symptoms.  This is likely vertigo.  We are prescribing you meclizine which may help your symptoms.  Please follow-up with your primary care doctor and schedule an appointment with ENT.  Return to the emergency department if any worsening or concerning symptoms. ?

## 2022-01-14 ENCOUNTER — Ambulatory Visit: Payer: Medicare Other | Admitting: Internal Medicine

## 2022-01-16 ENCOUNTER — Encounter: Payer: Self-pay | Admitting: Internal Medicine

## 2022-01-16 ENCOUNTER — Other Ambulatory Visit: Payer: Self-pay

## 2022-01-16 ENCOUNTER — Ambulatory Visit (INDEPENDENT_AMBULATORY_CARE_PROVIDER_SITE_OTHER): Payer: Medicare Other | Admitting: Internal Medicine

## 2022-01-16 VITALS — BP 118/70 | HR 77 | Resp 18 | Ht 63.0 in | Wt 113.4 lb

## 2022-01-16 DIAGNOSIS — C3491 Malignant neoplasm of unspecified part of right bronchus or lung: Secondary | ICD-10-CM | POA: Diagnosis not present

## 2022-01-16 DIAGNOSIS — E559 Vitamin D deficiency, unspecified: Secondary | ICD-10-CM | POA: Diagnosis not present

## 2022-01-16 DIAGNOSIS — Z23 Encounter for immunization: Secondary | ICD-10-CM

## 2022-01-16 DIAGNOSIS — E042 Nontoxic multinodular goiter: Secondary | ICD-10-CM

## 2022-01-16 DIAGNOSIS — I2584 Coronary atherosclerosis due to calcified coronary lesion: Secondary | ICD-10-CM | POA: Diagnosis not present

## 2022-01-16 DIAGNOSIS — R42 Dizziness and giddiness: Secondary | ICD-10-CM | POA: Diagnosis not present

## 2022-01-16 DIAGNOSIS — E538 Deficiency of other specified B group vitamins: Secondary | ICD-10-CM

## 2022-01-16 DIAGNOSIS — N951 Menopausal and female climacteric states: Secondary | ICD-10-CM

## 2022-01-16 DIAGNOSIS — I2510 Atherosclerotic heart disease of native coronary artery without angina pectoris: Secondary | ICD-10-CM | POA: Diagnosis not present

## 2022-01-16 MED ORDER — ERGOCALCIFEROL 1.25 MG (50000 UT) PO CAPS: 50000.0000 [IU] | ORAL_CAPSULE | ORAL | 1 refills | Status: DC

## 2022-01-16 NOTE — Assessment & Plan Note (Signed)
Using meclizine prn for the dizziness and is unable to tolerate due to tiredness after taking. Imaging reviewed with recent visit with CT angio head and neck without stenosis or stroke or mass. Reviewed this with her. Advised epley maneuver regularly and chronically given that she has intermittent vertigo fairly often. ?

## 2022-01-16 NOTE — Assessment & Plan Note (Signed)
Is taking estrogen patch 0.1 mg/24 hr changed twice a week. Can refill if needed currently her ob/gyn is monitoring and refilling. Symptoms are controlled.  ?

## 2022-01-16 NOTE — Assessment & Plan Note (Signed)
She was not up to date on pneumonia vaccine and counseled on risk of pneumonia given age and lung status. She agreed so given prevnar 20 today. She has history of wedge resection and there is a lesion that is being monitored by imaging currently through oncology/[facility].  ?

## 2022-01-16 NOTE — Progress Notes (Signed)
? ?  Subjective:  ? ?Patient ID: Jocelyn Parker, female    DOB: January 07, 1951, 71 y.o.   MRN: [id number] ? ?HPI ?The patient is a 71 YO female coming in for [facility] with some concerns.  ? ?PMH, [facility], social history reviewed and updated ? ?Review of Systems  ?Constitutional:  Positive for fatigue.  ?HENT: Negative.    ?Eyes: Negative.   ?Respiratory:  Positive for shortness of breath. Negative for cough and chest tightness.   ?Cardiovascular:  Negative for chest pain, palpitations and leg swelling.  ?Gastrointestinal:  Negative for abdominal distention, abdominal pain, constipation, diarrhea, nausea and vomiting.  ?Musculoskeletal: Negative.   ?Skin: Negative.   ?Neurological:  Positive for dizziness. Negative for weakness, light-headedness and numbness.  ?Psychiatric/Behavioral: Negative.    ? ?Objective:  ?Physical Exam ?Constitutional:   ?   Appearance: She is well-developed.  ?HENT:  ?   Head: Normocephalic and atraumatic.  ?Cardiovascular:  ?   Rate and Rhythm: Normal rate and regular rhythm.  ?Pulmonary:  ?   Effort: Pulmonary effort is normal. No respiratory distress.  ?   Breath sounds: Normal breath sounds. No wheezing or rales.  ?Abdominal:  ?   General: Bowel sounds are normal. There is no distension.  ?   Palpations: Abdomen is soft.  ?   Tenderness: There is no abdominal tenderness. There is no rebound.  ?Musculoskeletal:  ?   Cervical back: Normal range of motion.  ?Skin: ?   General: Skin is warm and dry.  ?Neurological:  ?   Mental Status: She is alert and oriented to person, place, and time.  ?   Coordination: Coordination normal.  ? ? ?Vitals:  ? 01/16/22 0906  ?BP: 118/70  ?Pulse: 77  ?Resp: 18  ?SpO2: 97%  ?Weight: 113 lb 6.4 oz (51.4 kg)  ?Height: 5\' 3"  (1.6 m)  ? ? ?This visit occurred during the SARS-CoV-2 public health emergency.  Safety protocols were in place, including screening questions prior to the visit, additional usage of staff PPE, and extensive cleaning of exam room while observing appropriate  contact time as indicated for disinfecting solutions.  ? ?Visit time 25 minutes in face to face communication with patient and coordination of care, additional 15 minutes spent in record review, coordination or care, ordering tests, communicating/referring to other healthcare professionals, documenting in medical records all on the same day of the visit for total time 40 minutes spent on the visit.  ? ? ?Assessment & Plan:  ?Prevnar 20 given at visit ?

## 2022-01-16 NOTE — Assessment & Plan Note (Signed)
She is doing well with monthly home B12 injections and can refill as needed. She is recommended lifelong treatment.  ?

## 2022-01-16 NOTE — Assessment & Plan Note (Signed)
She is unable to tolerate otc vitamin D preparations. Refilled rx 50000 units vitamin D once a month. Checking vitamin D level today as not checked in some time and will adjust dosing as needed.  ?

## 2022-01-16 NOTE — Assessment & Plan Note (Signed)
Does she endocrinology and takes levothyroxine 50 mcg alternating with 75 mcg every other day. Last levels normal and recent so will continue without changes today.  ?

## 2022-01-16 NOTE — Patient Instructions (Signed)
We will check the vitamin D levels today. ? ? ?

## 2022-01-21 ENCOUNTER — Other Ambulatory Visit: Payer: Medicare Other

## 2022-01-21 LAB — VITAMIN D 25 HYDROXY (VIT D DEFICIENCY, FRACTURES): VITD: 45.81 ng/mL (ref 30.00–100.00)

## 2022-01-25 ENCOUNTER — Telehealth: Payer: Self-pay | Admitting: Internal Medicine

## 2022-01-25 ENCOUNTER — Encounter: Payer: Self-pay | Admitting: Internal Medicine

## 2022-01-25 NOTE — Telephone Encounter (Signed)
Pt states she has been feeling fatigue after receiving the pneumonia vaccine on 3-15 ? ?Pt inquiring if fatigue is a normal side effect of the vaccine and if so, how long will the symptom last ? ?Pt requesting a cb ?

## 2022-01-28 NOTE — Telephone Encounter (Signed)
I would not expect that to be related to pneumonia vaccine. ?

## 2022-02-11 NOTE — Progress Notes (Signed)
Patient ID: Jocelyn Parker                 DOB: [birth year]                    MRN: [id number] ? ? ? ? ?HPI: ?Jocelyn Parker is a 71 y.o. female patient referred to lipid clinic by Dr. Ali Lowe. PMH is significant for HLD, FH, aortic atherosclerosis on CT head 01/07/22, and lung cancer. Calcium score at [facility] 01/24/20 was 22, which is between the 50th and 75th percentile for female between the ages of 63 and 65. Patient saw Dr. Ali Lowe on 12/07/21 to establish care with cardiology. Atorvastatin 40 mg was started at that time but then discontinued at 01/16/22 PCP visit. She has been followed previously by Dr. Debara Pickett for HLD and statin intolerance and was being worked up for [place]. She saw Dr. Broadus John but declined genetic testing. She has declined PCSK9i and ezetimibe in the past.  ? ?Today, patient arrives in good spirits. She had labs drawn today to check lipids. She reports having myalgias with multiple statins in the past. She did not start taking atorvastatin when Dr. Ali Lowe prescribed it in February since she has tried that one in the past and had myalgias. She generally prefers to focus on diet/exercise to improve cholesterol rather than take medications. Her LDL from October of 162 was when she was focusing on exercising regularly. She has not exercised as regularly lately due to the colder weather. She reports having xanthomas on both eyelids which she was told were cholesterol deposits and had them removed in the 1990s. She recently started taking CoQ10 from [employer] because it said it supported statins.  ? ?Current Medications: none ?Intolerances: atorvastatin, rosuvastatin, simvastatin (myalgias), red yeast rice (per patient, it aggravated her interstitial cystitis) ?Risk Factors: Significant Fhx ASCVD, FH, HLD, aortic atherosclerosis, elevated calcium score ?LDL goal: <70 mg/dL ? ?Diet: Eats 1 meal per day as a late lunch. Eats [employer] about three times per week. Avoids beef, pork, eggs, gluten. Has a snack of  popcorn or fruit in the evening.  ? ?Exercise: Walks outside when the weather is nice ? ?Family History: Heart attack in father (died of heart attack, age 30), two aunts and five uncles (ages ranging from 77s-80s), and paternal grandfather (died of heart attack, age 73s); stroke in maternal grandmother ? ?Social History: Never smoker ? ?Labs: ?-02/12/22: lipid panel and LFTs pending ?-08/09/21: TC 243, TG 113, HDL 57.6, LDL 162 (no lipid lowering medications) ?-LDL has been as high as 194 in the past (04/05/2019) ? ?Past Medical History:  ?Diagnosis Date  ? Allergy   ? sulfa  ? Chronic interstitial cystitis   ? Lung cancer ([place])   ? per patient   ? Thyroid disease   ? ? ?Current Outpatient Medications on File Prior to Visit  ?Medication Sig Dispense Refill  ? aspirin EC 81 MG tablet Take 1 tablet (81 mg total) by mouth daily. Swallow whole. 90 tablet 3  ? calcium carbonate (TUMS - DOSED IN MG ELEMENTAL CALCIUM) 500 MG chewable tablet 1 tablet    ? cyanocobalamin (,VITAMIN B-12,) 1000 MCG/ML injection Inject 1 mL (1,000 mcg total) into the muscle every 21 ( twenty-one) days. 10 mL 3  ? diclofenac Sodium (VOLTAREN) 1 % GEL Apply 2 g topically 4 (four) times daily. 100 g 3  ? ergocalciferol (VITAMIN D2) 1.25 MG (50000 UT) capsule Take 1 capsule (50,000 Units total) by mouth every 30 (thirty)  days. 6 capsule 1  ? estradiol (VIVELLE-DOT) 0.1 MG/24HR Place 1 patch (0.1 mg total) onto the skin 2 (two) times a week. 8 patch 12  ? fluticasone (FLONASE) 50 MCG/ACT nasal spray SHAKE LIQUID AND USE 2 SPRAYS IN EACH NOSTRIL DAILY (Patient taking differently: as needed. SHAKE LIQUID AND USE 2 SPRAYS IN EACH NOSTRIL DAILY) 16 g 2  ? ibuprofen (ADVIL) 800 MG tablet Take 1 tablet (800 mg total) by mouth every 8 (eight) hours as needed. ibuprofen 800 mg tablet 30 tablet 1  ? levothyroxine (SYNTHROID) 50 MCG tablet Take 50 mcg by mouth daily before breakfast.    ? levothyroxine (SYNTHROID, LEVOTHROID) 75 MCG tablet Take 75 mcg by mouth  daily.    ? meclizine (ANTIVERT) 25 MG tablet Take 1 tablet (25 mg total) by mouth 3 (three) times daily as needed for dizziness. Take 1-2 tablets as needed every 8 hours for dizziness. 30 tablet 0  ? Syringe/Needle, Disp, (SYRINGE 3CC/25GX1") 25G X 1" 3 ML MISC One injection monthly 12 each 0  ? UNABLE TO FIND Med Name: CBD Cream    ? ?No current facility-administered medications on file prior to visit.  ? ? ?Allergies  ?Allergen Reactions  ? Sulfa Antibiotics Hives  ? Montelukast Sodium   ?  dizziness  ? Vitamin D Other (See Comments)  ?  Pt stated can only take prescription   ? ? ?Assessment/Plan: ? ?1. Hyperlipidemia/Familial Hypercholesterolemia - Last LDL is not at goal <70 mg/dL. Fasting lipid panel from today is in process. Patient has definite FH given [place] criteria score of 10 (past LDL of 194, 1st degree relative with premature ASCVD, xanthoma of eyelid). Discussed patient's risk of ASCVD given family history, diagnosis of FH, aortic atherosclerosis, elevated CAC score, and significantly elevated LDL. She has a history of multiple statin intolerances. I explained that CoQ10 may or may not help with myalgias when taking statins but does not help to lower cholesterol. Discussed various medications which can lower LDL and reduce cardiac risk including PCSK9 inhibitors and Nexlizet as well as possible adverse effects with each. Would prefer PCSK9i first given better LDL lowering. She has the [facility] which covers Praluent and Nexlizet as tier 2 medications ($47/month). She would be able to utilize copay savings cards since this is a [employer] education [employer]. She would like to await the results of her labs today before deciding if she wants to start a medication, which she will research today. I will call her tomorrow after her labs result to see if she has made a decision on starting a medication. Will plan follow up lab work accordingly.  ? ?Rebbeca Paul, PharmD ?PGY2 [facility]  Resident ?02/12/2022 9:58 AM ? ?

## 2022-02-12 ENCOUNTER — Other Ambulatory Visit: Payer: Medicare Other | Admitting: *Deleted

## 2022-02-12 ENCOUNTER — Ambulatory Visit (INDEPENDENT_AMBULATORY_CARE_PROVIDER_SITE_OTHER): Payer: Medicare Other | Admitting: Student-PharmD

## 2022-02-12 DIAGNOSIS — E7801 Familial hypercholesterolemia: Secondary | ICD-10-CM

## 2022-02-12 DIAGNOSIS — I2510 Atherosclerotic heart disease of native coronary artery without angina pectoris: Secondary | ICD-10-CM | POA: Diagnosis not present

## 2022-02-12 DIAGNOSIS — I2584 Coronary atherosclerosis due to calcified coronary lesion: Secondary | ICD-10-CM

## 2022-02-12 DIAGNOSIS — E7849 Other hyperlipidemia: Secondary | ICD-10-CM

## 2022-02-12 LAB — LIPID PANEL
Chol/HDL Ratio: 5 ratio — ABNORMAL HIGH (ref 0.0–4.4)
Cholesterol, Total: 266 mg/dL — ABNORMAL HIGH (ref 100–199)
HDL: 53 mg/dL (ref >39)
LDL Chol Calc (NIH): 192 mg/dL — ABNORMAL HIGH (ref 0–99)
Triglycerides: 116 mg/dL (ref 0–149)
VLDL Cholesterol Cal: 21 mg/dL (ref 5–40)

## 2022-02-12 LAB — HEPATIC FUNCTION PANEL
ALT: 14 IU/L (ref 0–32)
AST: 13 IU/L (ref 0–40)
Albumin: 4 g/dL (ref 3.8–4.8)
Alkaline Phosphatase: 76 IU/L (ref 44–121)
Bilirubin Total: 0.3 mg/dL (ref 0.0–1.2)
Bilirubin, Direct: <0.1 mg/dL (ref 0.00–0.40)
Total Protein: 6.3 g/dL (ref 6.0–8.5)

## 2022-02-12 NOTE — Patient Instructions (Addendum)
Nice to see you today! ? ?Keep up the good work with diet and exercise. Aim for a diet full of vegetables, fruit and lean meats (chicken, [place], fish). Try to limit carbs (bread, pasta, sugar, rice) and red meat consumption. ? ?Your goal LDL is less than 70 mg/dL. ? ?Medication Options: ?The names of the medicines we talked about today are:  ?-PCSK9 inhibitors: Praluent - lowers LDL ~60% (every 2 week injection) ?-Nexlizet - lowers LDL ~40-50% (daily oral tablet) ?-ezetimibe - lowers LDL ~20% (daily oral tablet) ? ? ? ?

## 2022-02-13 ENCOUNTER — Telehealth: Payer: Self-pay | Admitting: Student-PharmD

## 2022-02-13 DIAGNOSIS — E7801 Familial hypercholesterolemia: Secondary | ICD-10-CM

## 2022-02-13 NOTE — Telephone Encounter (Signed)
Called patient regarding lab results from yesterday. LDL is significantly elevated at 192, up from 162 when checked last October. She attributes this to walking less over the winter months.  ? ?Strongly encouraged patient to consider starting PCSK9i or Nexlizet as discussed at yesterday's visit but she declines at this time. She would like to focus on diet and exercise and recheck labs first. Explained that while she should focus on diet and exercise changes, this alone will not be able to lower LDL to goal <70 mg/dL given baseline of 192. She is scheduled for follow up fasting lab work on 03/28/22. Will revisit medication discussion at that time.  ?

## 2022-03-04 ENCOUNTER — Encounter: Payer: Self-pay | Admitting: Family Medicine

## 2022-03-04 ENCOUNTER — Ambulatory Visit (INDEPENDENT_AMBULATORY_CARE_PROVIDER_SITE_OTHER): Payer: Self-pay | Admitting: Family Medicine

## 2022-03-04 DIAGNOSIS — J1282 Pneumonia due to coronavirus disease 2019: Secondary | ICD-10-CM

## 2022-03-04 DIAGNOSIS — U071 COVID-19: Secondary | ICD-10-CM

## 2022-03-04 MED ORDER — DEXAMETHASONE 6 MG PO TABS: 6.0000 mg | ORAL_TABLET | Freq: Every day | ORAL | 0 refills | Status: AC

## 2022-03-04 MED ORDER — ALBUTEROL SULFATE HFA 108 (90 BASE) MCG/ACT IN AERS: 2.0000 | INHALATION_SPRAY | Freq: Four times a day (QID) | RESPIRATORY_TRACT | 0 refills | Status: DC | PRN

## 2022-03-04 MED ORDER — ZINC GLUCONATE 50 MG PO TABS: 50.0000 mg | ORAL_TABLET | Freq: Every day | ORAL | 0 refills | Status: AC

## 2022-03-04 MED ORDER — ASPIRIN EC 325 MG PO TBEC
325.0000 mg | DELAYED_RELEASE_TABLET | Freq: Every day | ORAL | 0 refills | Status: DC
Start: 2022-03-04 — End: 2022-07-15

## 2022-03-04 NOTE — Progress Notes (Signed)
Subjective:  ?  ? Jocelyn Parker is a 71 y.o. female who presents for evaluation of symptoms of a URI. Symptoms include congestion, low grade fever, and shortness of breath. Onset of symptoms was 1 days ago, and has been stable since that time. Treatment to date: none. ? ?The following portions of the patient's history were reviewed and updated as appropriate: allergies, current medications, past family history, past medical history, past social history, past surgical history, and problem list. ? ?Note pt has hx of lung resection for cancer. Sp02 at home 94% but breathing good. Pt asking about Paxlovid. ? ?Review of Systems ?Pertinent items noted in HPI and remainder of comprehensive ROS otherwise negative.  ? ?Objective:  ? ? There were no vitals taken for this visit. ? ?RR 12 ?Sp02 94% ?Wt 113lb ?General Appearance:    Alert, cooperative, no distress, appears stated age  ?Head:    Normocephalic, without obvious abnormality, atraumatic  ?Eyes:    PER, conjunctiva/corneas clear, EOM's intact, both eyes  ?  ?Lungs:     No wheeze, respirations unlabored, speaking in full sentences  ?  ?Skin:   Skin color, texture, turgor normal, no rashes or lesions  ?   ?Neurologic:   CNII-XII intact grossly, A&Ox3  ?  ? ?Assessment:  ? ? Covid pna  ? ?Plan:  ?Diagnoses and all orders for this visit: ? ?Pneumonia due to COVID-19 virus ?-     dexamethasone (DECADRON) 6 MG tablet; Take 1 tablet (6 mg total) by mouth daily for 7 days. ?-     zinc gluconate 50 MG tablet; Take 1 tablet (50 mg total) by mouth daily for 14 days. ?-     aspirin EC 325 MG tablet; Take 1 tablet (325 mg total) by mouth daily. ?-     albuterol (VENTOLIN HFA) 108 (90 Base) MCG/ACT inhaler; Inhale 2 puffs into the lungs every 6 (six) hours as needed for wheezing or shortness of breath. ? ? ? Suggested symptomatic OTC remedies. ?F/u in 5 days, pt understands quarantine and direction for 10 day masking ?Return and ED precautions discussed, pt VU ?Pt to alert office  if worse sob or O2 92% or less ?  ? ?Elwin Mocha, MD ? ?

## 2022-03-13 ENCOUNTER — Ambulatory Visit: Payer: Medicare Other | Admitting: Dermatology

## 2022-03-28 ENCOUNTER — Other Ambulatory Visit: Payer: Medicare Other

## 2022-04-02 ENCOUNTER — Other Ambulatory Visit: Payer: Self-pay | Admitting: Internal Medicine

## 2022-04-03 ENCOUNTER — Telehealth: Payer: Self-pay

## 2022-04-03 NOTE — Telephone Encounter (Signed)
Pt is requesting a refill on: cyanocobalamin (,VITAMIN B-12,) 1000 MCG/ML injection  Pharmacy: [id number] [address], [address] AT [facility] OF [address]  LOV 01/16/22  ROV 07/15/22

## 2022-04-04 MED ORDER — CYANOCOBALAMIN 1000 MCG/ML IJ SOLN: 1000.0000 ug | INTRAMUSCULAR | 3 refills | Status: DC

## 2022-04-04 NOTE — Telephone Encounter (Signed)
Fine to refill 90 day with 1 refill

## 2022-04-04 NOTE — Telephone Encounter (Signed)
Refill has been sent to the patient's pharmacy.  

## 2022-04-29 ENCOUNTER — Ambulatory Visit (INDEPENDENT_AMBULATORY_CARE_PROVIDER_SITE_OTHER): Payer: Medicare Other | Admitting: Dermatology

## 2022-04-29 ENCOUNTER — Encounter: Payer: Self-pay | Admitting: Dermatology

## 2022-04-29 DIAGNOSIS — H0266 Xanthelasma of left eye, unspecified eyelid: Secondary | ICD-10-CM

## 2022-04-29 DIAGNOSIS — H0260 Xanthelasma of unspecified eye, unspecified eyelid: Secondary | ICD-10-CM

## 2022-04-29 DIAGNOSIS — I2584 Coronary atherosclerosis due to calcified coronary lesion: Secondary | ICD-10-CM

## 2022-04-29 DIAGNOSIS — H0263 Xanthelasma of right eye, unspecified eyelid: Secondary | ICD-10-CM | POA: Diagnosis not present

## 2022-04-29 DIAGNOSIS — I2510 Atherosclerotic heart disease of native coronary artery without angina pectoris: Secondary | ICD-10-CM | POA: Diagnosis not present

## 2022-04-29 NOTE — Progress Notes (Addendum)
I, Lavonna Monarch, MD, have reviewed all documentation for this visit. The documentation on 05/26/22 for the exam, diagnosis, procedures, and orders are all accurate and complete.  Follow-Up Visit   Subjective  Jocelyn Parker is a 71 y.o. female who presents for the following: Annual Exam (Recheck tan lesion on the face only patient refused gown ).  Check spots around her eyelids Location:  Duration:  Quality:  Associated Signs/Symptoms: Modifying Factors:  Severity:  Timing: Context:   Objective  Well appearing patient in no apparent distress; mood and affect are within normal limits. Left Medial [place], Right Medial [place] Yellow dermal deposits both inner eyelids; role of cholesterol particularly LDL in producing these was reviewed.    A focused examination was performed including head and neck. Relevant physical exam findings are noted in the Assessment and Plan.   Assessment & Plan    Xanthelasma (2) Left Medial [place]; Right Medial [place]  Will do computer search to see if newer cholesterol medication with make Xanthelasma go away.  Addendum: I found 1 case report of Praluent plus statin helping xanthelasma resolve.  This is not an FDA approved indication.      I, Lavonna Monarch, MD, have reviewed all documentation for this visit.  The documentation on 05/26/22 for the exam, diagnosis, procedures, and orders are all accurate and complete.Computer search on new cholesterol medications will make Xanthelasma go away

## 2022-04-30 ENCOUNTER — Other Ambulatory Visit: Payer: Medicare Other

## 2022-04-30 DIAGNOSIS — E7801 Familial hypercholesterolemia: Secondary | ICD-10-CM

## 2022-05-01 ENCOUNTER — Other Ambulatory Visit: Payer: Self-pay | Admitting: Family Medicine

## 2022-05-01 ENCOUNTER — Telehealth: Payer: Self-pay | Admitting: Student-PharmD

## 2022-05-01 DIAGNOSIS — J302 Other seasonal allergic rhinitis: Secondary | ICD-10-CM

## 2022-05-01 LAB — LIPID PANEL
Chol/HDL Ratio: 4.1 ratio (ref 0.0–4.4)
Cholesterol, Total: 235 mg/dL — ABNORMAL HIGH (ref 100–199)
HDL: 57 mg/dL (ref >39)
LDL Chol Calc (NIH): 168 mg/dL — ABNORMAL HIGH (ref 0–99)
Triglycerides: 61 mg/dL (ref 0–149)
VLDL Cholesterol Cal: 10 mg/dL (ref 5–40)

## 2022-05-01 NOTE — Telephone Encounter (Signed)
LDL has improved 192>168 with lifestyle modifications but is not at goal <70 mg/dL. We have previously extensively discussed importance of medications to help lower LDL given persistently elevated LDL and patient's hx of FH. Called patient to review results and revisit this discussion. Strongly encouraged her to start PCSK9i but she has decided that she does not want to take any medications and will continue working on her lifestyle to help bring it down. She is aware of her CV risk. Encouraged patient to reconsider and let us know if she changes her mind.

## 2022-05-02 ENCOUNTER — Telehealth: Payer: Self-pay | Admitting: Internal Medicine

## 2022-05-02 NOTE — Telephone Encounter (Signed)
Pt states she called [facility] ([place]) as instructed by [place] ([place] [place] Rep to do for medication refill.  Pt states [facility] told her "because the prescription shows two refills [the patient] must see original prescriber."  Sending to [facility] supervisor for follow up with [facility] because of current, established PCP.

## 2022-05-02 NOTE — Telephone Encounter (Signed)
Patient called in regards to flonase spray not being able to be refilled at her pharmacy. Although the medication was initially prescribed by Alyssa Allwardt on 09/26/21, I advised patient to contact her PCP, Dr. Sharlet Salina at [facility] to see if they could refill this.

## 2022-05-03 ENCOUNTER — Other Ambulatory Visit: Payer: Self-pay | Admitting: Internal Medicine

## 2022-05-03 DIAGNOSIS — J302 Other seasonal allergic rhinitis: Secondary | ICD-10-CM

## 2022-05-03 MED ORDER — FLUTICASONE PROPIONATE 50 MCG/ACT NA SUSP: 2.0000 | NASAL | 3 refills | Status: DC | PRN

## 2022-05-03 NOTE — Telephone Encounter (Signed)
Patient is requesting refill of Flonase be sent to [id number] - [place], [address] AT [place]
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
## 2022-05-21 ENCOUNTER — Telehealth: Payer: Self-pay | Admitting: Internal Medicine
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
## 2022-05-26 ENCOUNTER — Encounter: Payer: Self-pay | Admitting: Dermatology

## 2022-05-29 NOTE — Telephone Encounter (Signed)
Looks like billing is currently working on this, from billing notes documented.    I have informed patient to give a call back if she has any other concerns.

## 2022-07-15 ENCOUNTER — Ambulatory Visit: Payer: Medicare Other | Admitting: Internal Medicine

## 2022-07-15 ENCOUNTER — Ambulatory Visit (INDEPENDENT_AMBULATORY_CARE_PROVIDER_SITE_OTHER): Payer: Medicare Other | Admitting: Internal Medicine

## 2022-07-15 ENCOUNTER — Encounter: Payer: Self-pay | Admitting: Internal Medicine

## 2022-07-15 VITALS — BP 116/78 | HR 78 | Ht 63.0 in | Wt 116.0 lb

## 2022-07-15 DIAGNOSIS — C3411 Malignant neoplasm of upper lobe, right bronchus or lung: Secondary | ICD-10-CM

## 2022-07-15 DIAGNOSIS — R7301 Impaired fasting glucose: Secondary | ICD-10-CM | POA: Diagnosis not present

## 2022-07-15 DIAGNOSIS — E042 Nontoxic multinodular goiter: Secondary | ICD-10-CM | POA: Diagnosis not present

## 2022-07-15 DIAGNOSIS — I2584 Coronary atherosclerosis due to calcified coronary lesion: Secondary | ICD-10-CM

## 2022-07-15 DIAGNOSIS — I2510 Atherosclerotic heart disease of native coronary artery without angina pectoris: Secondary | ICD-10-CM

## 2022-07-15 DIAGNOSIS — E7801 Familial hypercholesterolemia: Secondary | ICD-10-CM | POA: Diagnosis not present

## 2022-07-15 DIAGNOSIS — R0602 Shortness of breath: Secondary | ICD-10-CM

## 2022-07-15 LAB — T4, FREE: Free T4: 1.07 ng/dL (ref 0.60–1.60)

## 2022-07-15 LAB — TSH: TSH: 0.88 u[IU]/mL (ref 0.35–5.50)

## 2022-07-15 NOTE — Patient Instructions (Signed)
We will check the labs today. 

## 2022-07-15 NOTE — Progress Notes (Unsigned)
   Subjective:   Patient ID: Jocelyn Parker, female    DOB: [birth year], 71 y.o.   MRN: [id number]  HPI The patient is a 71 YO female coming in for follow up.   Review of Systems  Objective:  Physical Exam  Vitals:   07/15/22 1540  BP: 116/78  Pulse: 78  SpO2: 98%  Weight: 116 lb (52.6 kg)  Height: 5' 3"  (1.6 m)    Assessment & Plan:

## 2022-07-16 ENCOUNTER — Encounter: Payer: Self-pay | Admitting: Internal Medicine

## 2022-07-16 LAB — LIPID PANEL
Cholesterol: 218 mg/dL — ABNORMAL HIGH (ref 0–200)
HDL: 50.3 mg/dL (ref >39.00)
LDL Cholesterol: 150 mg/dL — ABNORMAL HIGH (ref 0–99)
NonHDL: 167.46
Total CHOL/HDL Ratio: 4
Triglycerides: 85 mg/dL (ref 0.0–149.0)
VLDL: 17 mg/dL (ref 0.0–40.0)

## 2022-07-16 NOTE — Assessment & Plan Note (Signed)
Checking TSH and free T4 to ensure levels are at goal. Adjust as needed synthroid 50 alternating with 75 mcg daily.

## 2022-07-16 NOTE — Assessment & Plan Note (Signed)
Numbers slightly down on last cardiology panel. Repeating today as she is making changes ongoing. Had extensive discussion about statins and injectables cardiology is considering. Poor reaction to lipitor in the past. We discussed crestor 10 mg once a week for 1-2 months then increase gradually for tolerance if willing.

## 2022-07-16 NOTE — Assessment & Plan Note (Signed)
Reviewed recent labs no indication for change.

## 2022-07-16 NOTE — Assessment & Plan Note (Signed)
Following with scans, reviewed results with patient today.

## 2022-07-16 NOTE — Assessment & Plan Note (Signed)
Stable since lobectomy and likely due to volume loss. She is active and does all activities without significant impairment.

## 2022-07-17 ENCOUNTER — Telehealth: Payer: Self-pay

## 2022-07-17 NOTE — Chronic Care Management (AMB) (Signed)
  Care Coordination   Note   07/17/2022 Name: Anslie Spadafora MRN: [id number] DOB: [birth year]  Amarrah Meinhart is a 71 y.o. year old female who sees Hoyt Koch, MD for primary care. I reached out to Carl Best by phone today to offer care coordination services.  Ms. Lovins was given information about Care Coordination services today including:   The Care Coordination services include support from the care team which includes your Nurse Coordinator, Clinical Social Worker, or Pharmacist.  The Care Coordination team is here to help remove barriers to the health concerns and goals most important to you. Care Coordination services are voluntary, and the patient may decline or stop services at any time by request to their care team member.   Care Coordination Consent Status: Patient did not agree to participate in care coordination services at this time.    Encounter Outcome:  Pt. Refused  Noreene Larsson, [place], [place] [address] Direct Dial: [phone] [email or web address]

## 2022-08-15 ENCOUNTER — Ambulatory Visit: Payer: Medicare Other

## 2022-09-05 LAB — HM DIABETES EYE EXAM

## 2022-09-19 ENCOUNTER — Encounter: Payer: Self-pay | Admitting: Internal Medicine

## 2022-09-19 NOTE — Progress Notes (Signed)
Outside notes received. Information abstracted. Notes sent to scan.  
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
## 2022-10-14 ENCOUNTER — Ambulatory Visit (INDEPENDENT_AMBULATORY_CARE_PROVIDER_SITE_OTHER): Payer: Medicare Other | Admitting: Internal Medicine

## 2022-10-14 DIAGNOSIS — I2510 Atherosclerotic heart disease of native coronary artery without angina pectoris: Secondary | ICD-10-CM

## 2022-10-14 DIAGNOSIS — I700 Atherosclerosis of aorta: Secondary | ICD-10-CM

## 2022-10-14 DIAGNOSIS — E785 Hyperlipidemia, unspecified: Secondary | ICD-10-CM

## 2022-10-14 DIAGNOSIS — I2584 Coronary atherosclerosis due to calcified coronary lesion: Secondary | ICD-10-CM

## 2022-11-05 NOTE — Progress Notes (Signed)
Office Visit Note  Patient: Jocelyn Parker             Date of Birth: Nov 12, 1950           MRN: [id number]             PCP: Hoyt Koch, MD Referring: Allwardt, Randa Evens, PA-C Visit Date: 11/13/2022 Occupation: @GUAROCC @  Subjective:  Lower back and left lower extremity pain  History of Present Illness: Lakashia Collison is a 72 y.o. female with positive ANA, osteoarthritis and degenerative disc disease.  She states that she has been experiencing left lower extremity tightness and burning sensation for the last 2 to 5 months.  She was evaluated by Dr. Berdine Addison, her neurologist.  She had MRI of her lumbar spine May 22, 2022 which showed multilevel spondylosis and mild progression at L5-S1 level.  She had x-ray of the thoracic spine which showed mild degenerative changes.  Patient states she also had nerve conduction velocities by her neurologist.  He gave her a TENS unit to apply.  She has been also using topical magnesium.  She was also given tizanidine 4 mg at bedtime.  She had mild discoloration in her feet at the beginning of the fall.  She had no recurrence of symptoms since then.  She continues to have some stiffness in her lower back and her knee joints.  She denies any history of oral ulcers, nasal ulcers, malar rash, photosensitivity, sicca symptoms, lymphadenopathy or inflammatory arthritis    Activities of Daily Living:  Patient reports morning stiffness for a few minutes.   Patient Reports nocturnal pain.  Difficulty dressing/grooming: Denies Difficulty climbing stairs: Denies Difficulty getting out of chair: Denies Difficulty using hands for taps, buttons, cutlery, and/or writing: Denies  Review of Systems  Constitutional:  Negative for fatigue.  HENT:  Negative for mouth sores and mouth dryness.   Eyes:  Negative for dryness.  Respiratory:  Negative for shortness of breath.   Cardiovascular:  Negative for chest pain and palpitations.  Gastrointestinal:  Negative for  blood in stool, constipation and diarrhea.  Endocrine: Negative for increased urination.  Genitourinary:  Negative for involuntary urination.  Musculoskeletal:  Positive for morning stiffness. Negative for joint pain, gait problem, joint pain, joint swelling, myalgias, muscle weakness, muscle tenderness and myalgias.  Skin:  Negative for color change, rash, hair loss and sensitivity to sunlight.  Allergic/Immunologic: Negative for susceptible to infections.  Neurological:  Positive for dizziness. Negative for headaches.  Hematological:  Negative for swollen glands.  Psychiatric/Behavioral:  Negative for depressed mood and sleep disturbance. The patient is not nervous/anxious.     PMFS History:  Patient Active Problem List   Diagnosis Date Noted   Leg pain 11/12/2022   Familial hypercholesterolemia 02/12/2022   Celiac disease 07/17/2021   Impaired fasting glucose 07/17/2021   Vertigo 03/25/2021   Gastritis with intestinal metaplasia of stomach 01/10/2021   S/P partial lobectomy of lung 04/12/2020   Nodule of upper lobe of right lung 04/05/2020   Bilateral breast cysts 12/27/2019   Multiple cysts of breast 12/27/2019   Malignant neoplasm of upper lobe of right lung ([place]) 10/12/2019   Sensorineural hearing loss (SNHL) of both ears 06/20/2019   Adenocarcinoma of lung, stage 1, right ([place]) 05/26/2019   Tinnitus of both ears 05/12/2019   B12 deficiency 04/11/2019   Cyst of ovary 04/01/2019   Menopause syndrome 04/01/2019   Osteopenia 04/01/2019   Vitamin D deficiency 05/24/2015   Xanthoma of eyelid 12/21/2014  Cyst of kidney, acquired 12/20/2014   Interstitial cystitis (chronic) without hematuria 03/20/2012   Shortness of breath 08/16/2008   Nontoxic multinodular goiter 08/08/2007    Past Medical History:  Diagnosis Date   Allergy    sulfa   Chronic interstitial cystitis    Lung cancer ([place])    per patient    Thyroid disease     Family History  Problem Relation Age of  Onset   Renal cancer Mother    Heart attack Father    Stroke Maternal Grandmother    Heart attack Paternal Grandfather    Multiple sclerosis Sister    Cancer Brother    Past Surgical History:  Procedure Laterality Date   ABDOMINAL HYSTERECTOMY     lumps removed from breast     1970's ,1990's and in 2000's  nono cancer    LUNG SURGERY  04/05/2020   Social History   Social History Narrative   Not on file   Immunization History  Administered Date(s) Administered   COVID-19, mRNA, vaccine(Comirnaty)12 years and older 08/28/2022   Influenza-Unspecified 12/28/2001   PFIZER(Purple Top)SARS-COV-2 Vaccination 12/03/2019, 12/31/2019, 07/05/2020, 08/04/2020, 02/09/2021, 07/21/2021   PNEUMOCOCCAL CONJUGATE-20 01/16/2022   PPD Test 11/27/2015   Pfizer Covid-19 Vaccine Bivalent Booster 43yrs & up 07/17/2021   Tdap 07/22/2008     Objective: Vital Signs: BP 104/69 (BP Location: Left Arm, Patient Position: Sitting, Cuff Size: Normal)   Pulse 82   Resp 15   Ht 5' 3.5" (1.613 m)   Wt 114 lb 6.4 oz (51.9 kg)   BMI 19.95 kg/m    Physical Exam Vitals and nursing note reviewed.  Constitutional:      Appearance: She is well-developed.  HENT:     Head: Normocephalic and atraumatic.  Eyes:     Conjunctiva/sclera: Conjunctivae normal.  Cardiovascular:     Rate and Rhythm: Normal rate and regular rhythm.     Heart sounds: Normal heart sounds.  Pulmonary:     Effort: Pulmonary effort is normal.     Breath sounds: Normal breath sounds.  Abdominal:     General: Bowel sounds are normal.     Palpations: Abdomen is soft.  Musculoskeletal:     Cervical back: Normal range of motion.  Lymphadenopathy:     Cervical: No cervical adenopathy.  Skin:    General: Skin is warm and dry.     Capillary Refill: Capillary refill takes less than 2 seconds.  Neurological:     Mental Status: She is alert and oriented to person, place, and time.  Psychiatric:        Behavior: Behavior normal.       Musculoskeletal Exam: Cervical, thoracic and lumbar spine were in good range of motion.  She had discomfort range of motion of her lumbar spine.  Shoulder joints, elbow joints, wrist joints, MCPs PIPs and DIPs been good range of motion with no synovitis.  Hip joints and knee joints were in good range of motion.  She had no tenderness over ankles or MTPs.  CDAI Exam: CDAI Score: -- Patient Global: --; Provider Global: -- Swollen: --; Tender: -- Joint Exam 11/13/2022   No joint exam has been documented for this visit   There is currently no information documented on the homunculus. Go to the Rheumatology activity and complete the homunculus joint exam.  Investigation: No additional findings.  Imaging: No results found.  Recent Labs: Lab Results  Component Value Date   WBC 5.3 01/07/2022   HGB 13.9 01/07/2022  PLT 278 01/07/2022   NA 138 01/07/2022   K 3.9 01/07/2022   CL 102 01/07/2022   CO2 30 01/07/2022   GLUCOSE 91 01/07/2022   BUN 18 01/07/2022   CREATININE 0.82 01/07/2022   BILITOT 0.3 02/12/2022   ALKPHOS 76 02/12/2022   AST 13 02/12/2022   ALT 14 02/12/2022   PROT 6.3 02/12/2022   ALBUMIN 4.0 02/12/2022   CALCIUM 9.4 01/07/2022   GFRAA >60 02/04/2016   QFTBGOLDPLUS NEGATIVE 05/26/2019   Number 10/2022 CBC and CMP were normal.  Speciality Comments: No specialty comments available.  Procedures:  No procedures performed Allergies: Sulfa antibiotics, Montelukast sodium, and Vitamin d (calciferol)   Assessment / Plan:     Visit Diagnoses: Positive ANA (antinuclear antibody) - ANA 1: 320NS, ENA negative, complements normal, no clinical features of autoimmune disease.  Positive TPO antibody. -She denies any history of oral ulcers, nasal ulcers, malar rash, photosensitivity, sicca symptoms, lymphadenopathy or inflammatory arthritis.  She continues to have some Raynauds symptoms.  I will check autoimmune labs today.  Her ANA has been positive in the past but all  other autoimmune antibodies were negative.  Advised her to contact me if she develops any new symptoms.  Plan: Protein / creatinine ratio, urine, ANA, Anti-DNA antibody, double-stranded, C3 and C4, Sedimentation rate  Raynaud's disease without gangrene-keeping core temperature warm and warm clothing was discussed.  No nailbed capillary changes or sclerodactyly was noted.  Primary osteoarthritis of both knees - Bilateral moderate osteoarthritis and mild chondromalacia patella.  She continues to have some stiffness.  DDD (degenerative disc disease), lumbar - Mild levoscoliosis, mild spondylosis and facet joint arthropathy.  She had MRI of her lumbar spine in July 2023 which showed multilevel spondylosis and mild progression of L5-S1 level.  I also reviewed notes from Dr. Berdine Addison which shows that she had S1 radiculopathy.  She has been using TENS unit and also trying tizanidine.  Core strengthening exercises were advised.  Myalgia - CK is normal.  Continues to have some generalized muscle pain.  Osteopenia, unspecified location - followed by her PCP.  Calcium rich diet and resistive exercise were emphasized.  Vitamin D deficiency-her vitamin D was normal on November 12, 2022 at 38.10  Other medical problems are listed as follows:  Adenocarcinoma of lung, stage 1, right ([facility])  S/P partial lobectomy of lung  Mixed hyperlipidemia  Acquired hypothyroidism  Nontoxic multinodular goiter  Xanthoma of eyelid  Interstitial cystitis (chronic) without hematuria  B12 deficiency  Bilateral fibrocystic breast disease ([facility])  Sensorineural hearing loss (SNHL) of both ears  Cyst of kidney, acquired  Orders: Orders Placed This Encounter  Procedures   Protein / creatinine ratio, urine   ANA   Anti-DNA antibody, double-stranded   C3 and C4   Sedimentation rate   No orders of the defined types were placed in this encounter.  .  Follow-Up Instructions: Return in about 1 year (around 11/14/2023)  for +ANA, OA.   Bo Merino, MD  Note - This record has been created using Editor, commissioning.  Chart creation errors have been sought, but may not always  have been located. Such creation errors do not reflect on  the standard of medical care.

## 2022-11-11 ENCOUNTER — Ambulatory Visit: Payer: Medicare Other | Admitting: Internal Medicine
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
## 2022-11-12 ENCOUNTER — Encounter: Payer: Self-pay | Admitting: Internal Medicine

## 2022-11-12 ENCOUNTER — Ambulatory Visit: Payer: Medicare Other | Attending: Internal Medicine | Admitting: Internal Medicine

## 2022-11-12 ENCOUNTER — Ambulatory Visit (INDEPENDENT_AMBULATORY_CARE_PROVIDER_SITE_OTHER): Payer: Medicare Other | Admitting: Internal Medicine

## 2022-11-12 VITALS — BP 112/60 | HR 81 | Temp 97.6°F | Ht 63.0 in | Wt 114.0 lb

## 2022-11-12 VITALS — BP 108/58 | HR 80 | Ht 63.5 in | Wt 113.8 lb

## 2022-11-12 DIAGNOSIS — I700 Atherosclerosis of aorta: Secondary | ICD-10-CM | POA: Diagnosis present

## 2022-11-12 DIAGNOSIS — I2584 Coronary atherosclerosis due to calcified coronary lesion: Secondary | ICD-10-CM

## 2022-11-12 DIAGNOSIS — E042 Nontoxic multinodular goiter: Secondary | ICD-10-CM

## 2022-11-12 DIAGNOSIS — I2510 Atherosclerotic heart disease of native coronary artery without angina pectoris: Secondary | ICD-10-CM

## 2022-11-12 DIAGNOSIS — E559 Vitamin D deficiency, unspecified: Secondary | ICD-10-CM

## 2022-11-12 DIAGNOSIS — E785 Hyperlipidemia, unspecified: Secondary | ICD-10-CM

## 2022-11-12 DIAGNOSIS — E538 Deficiency of other specified B group vitamins: Secondary | ICD-10-CM | POA: Diagnosis not present

## 2022-11-12 DIAGNOSIS — E7801 Familial hypercholesterolemia: Secondary | ICD-10-CM | POA: Diagnosis not present

## 2022-11-12 DIAGNOSIS — M79605 Pain in left leg: Secondary | ICD-10-CM

## 2022-11-12 DIAGNOSIS — M79606 Pain in leg, unspecified: Secondary | ICD-10-CM | POA: Insufficient documentation

## 2022-11-12 DIAGNOSIS — R7301 Impaired fasting glucose: Secondary | ICD-10-CM | POA: Diagnosis not present

## 2022-11-12 DIAGNOSIS — M79604 Pain in right leg: Secondary | ICD-10-CM

## 2022-11-12 LAB — LIPID PANEL
Cholesterol: 247 mg/dL — ABNORMAL HIGH (ref 0–200)
HDL: 54.2 mg/dL (ref >39.00)
LDL Cholesterol: 179 mg/dL — ABNORMAL HIGH (ref 0–99)
NonHDL: 192.57
Total CHOL/HDL Ratio: 5
Triglycerides: 66 mg/dL (ref 0.0–149.0)
VLDL: 13.2 mg/dL (ref 0.0–40.0)

## 2022-11-12 LAB — HEMOGLOBIN A1C: Hgb A1c MFr Bld: 5.6 % (ref 4.6–6.5)

## 2022-11-12 LAB — TSH: TSH: 1.82 u[IU]/mL (ref 0.35–5.50)

## 2022-11-12 LAB — T4, FREE: Free T4: 0.91 ng/dL (ref 0.60–1.60)

## 2022-11-12 LAB — VITAMIN D 25 HYDROXY (VIT D DEFICIENCY, FRACTURES): VITD: 38.1 ng/mL (ref 30.00–100.00)

## 2022-11-12 LAB — VITAMIN B12: Vitamin B-12: 804 pg/mL (ref 211–911)

## 2022-11-12 NOTE — Assessment & Plan Note (Signed)
She is having tightness in her legs. Checking TSH, free T4, B12, vitamin D, HgA1c to rule out variant on neuropathy. She does have some low back issues which could be causing. She also has hyperlipidemia which is untreated and so if labs normal will order ABI. No symptoms on exertion. No swelling. Advised to increase hydration to see if this helps. Started 2-3 weeks ago. She states low salt intake but eats at [employer] often.

## 2022-11-12 NOTE — Progress Notes (Signed)
   Subjective:   Patient ID: Jocelyn Parker, female    DOB: 04-Dec-1950, 72 y.o.   MRN: [id number]  HPI The patient is a 72 YO female coming in for tightness in the legs. No swelling per say. No pain or burning or numbness.   Review of Systems  Constitutional: Negative.   HENT: Negative.    Eyes: Negative.   Respiratory:  Negative for cough, chest tightness and shortness of breath.   Cardiovascular:  Negative for chest pain, palpitations and leg swelling.  Gastrointestinal:  Negative for abdominal distention, abdominal pain, constipation, diarrhea, nausea and vomiting.  Musculoskeletal: Negative.   Skin: Negative.   Neurological: Negative.   Psychiatric/Behavioral: Negative.      Objective:  Physical Exam Constitutional:      Appearance: She is well-developed.     Comments: thin  HENT:     Head: Normocephalic and atraumatic.  Cardiovascular:     Rate and Rhythm: Normal rate and regular rhythm.  Pulmonary:     Effort: Pulmonary effort is normal. No respiratory distress.     Breath sounds: Normal breath sounds. No wheezing or rales.  Abdominal:     General: Bowel sounds are normal. There is no distension.     Palpations: Abdomen is soft.     Tenderness: There is no abdominal tenderness. There is no rebound.  Musculoskeletal:     Cervical back: Normal range of motion.     Comments: Slight tenderness to palpation legs to knees, no swelling detected and no calf swelling  Skin:    General: Skin is warm and dry.  Neurological:     Mental Status: She is alert and oriented to person, place, and time.     Coordination: Coordination normal.     Vitals:   11/12/22 0906  BP: 112/60  Pulse: 81  Temp: 97.6 F (36.4 C)  TempSrc: Oral  SpO2: 95%  Weight: 114 lb (51.7 kg)  Height: 5\' 3"  (1.6 m)    Assessment & Plan:

## 2022-11-12 NOTE — Patient Instructions (Signed)
We will check the labs today and if normal we will check the blood flow in the legs.

## 2022-11-12 NOTE — Addendum Note (Signed)
Addended by: Pricilla Holm A on: 11/12/2022 09:57 AM   Modules accepted: Orders

## 2022-11-12 NOTE — Assessment & Plan Note (Signed)
Checking vitamin D level and adjust as needed. Taking otc.

## 2022-11-12 NOTE — Assessment & Plan Note (Signed)
Checking B12 and doing monthly injections at home. Will continue. Adjust as needed.

## 2022-11-12 NOTE — Patient Instructions (Signed)
Medication Instructions:  No changes *If you need a refill on your cardiac medications before your next appointment, please call your pharmacy*   Lab Work: none If you have labs (blood work) drawn today and your tests are completely normal, you will receive your results only by: [place] (if you have MyChart) OR A paper copy in the mail If you have any lab test that is abnormal or we need to change your treatment, we will call you to review the results.   Testing/Procedures: Calcium Score CT scan --$95 cost   Follow-Up: At [facility], you and your health needs are our priority.  As part of our continuing mission to provide you with exceptional heart care, we have created designated Provider Care Teams.  These Care Teams include your primary Cardiologist (physician) and Advanced Practice Providers (APPs -  Physician Assistants and Nurse Practitioners) who all work together to provide you with the care you need, when you need it.     Your next appointment:   12 month(s)  The format for your next appointment:   In Person  Provider:   Early Osmond, MD      Important Information About Sugar

## 2022-11-12 NOTE — Assessment & Plan Note (Signed)
Checking HgA1c and adjust as needed.  

## 2022-11-12 NOTE — Assessment & Plan Note (Signed)
Checking TSH and free T4 due to new symptoms of tightness in the legs.

## 2022-11-13 ENCOUNTER — Encounter: Payer: Self-pay | Admitting: Rheumatology

## 2022-11-13 ENCOUNTER — Ambulatory Visit: Payer: Medicare Other | Attending: Rheumatology | Admitting: Rheumatology

## 2022-11-13 VITALS — BP 104/69 | HR 82 | Resp 15 | Ht 63.5 in | Wt 114.4 lb

## 2022-11-13 DIAGNOSIS — N281 Cyst of kidney, acquired: Secondary | ICD-10-CM | POA: Diagnosis present

## 2022-11-13 DIAGNOSIS — M8580 Other specified disorders of bone density and structure, unspecified site: Secondary | ICD-10-CM

## 2022-11-13 DIAGNOSIS — N6011 Diffuse cystic mastopathy of right breast: Secondary | ICD-10-CM

## 2022-11-13 DIAGNOSIS — R7689 Other specified abnormal immunological findings in serum: Secondary | ICD-10-CM

## 2022-11-13 DIAGNOSIS — M170 Bilateral primary osteoarthritis of knee: Secondary | ICD-10-CM

## 2022-11-13 DIAGNOSIS — E538 Deficiency of other specified B group vitamins: Secondary | ICD-10-CM

## 2022-11-13 DIAGNOSIS — E039 Hypothyroidism, unspecified: Secondary | ICD-10-CM | POA: Diagnosis present

## 2022-11-13 DIAGNOSIS — H903 Sensorineural hearing loss, bilateral: Secondary | ICD-10-CM

## 2022-11-13 DIAGNOSIS — R768 Other specified abnormal immunological findings in serum: Secondary | ICD-10-CM | POA: Insufficient documentation

## 2022-11-13 DIAGNOSIS — C3491 Malignant neoplasm of unspecified part of right bronchus or lung: Secondary | ICD-10-CM

## 2022-11-13 DIAGNOSIS — M51369 Other intervertebral disc degeneration, lumbar region without mention of lumbar back pain or lower extremity pain: Secondary | ICD-10-CM

## 2022-11-13 DIAGNOSIS — H0260 Xanthelasma of unspecified eye, unspecified eyelid: Secondary | ICD-10-CM | POA: Diagnosis present

## 2022-11-13 DIAGNOSIS — E782 Mixed hyperlipidemia: Secondary | ICD-10-CM | POA: Diagnosis present

## 2022-11-13 DIAGNOSIS — M791 Myalgia, unspecified site: Secondary | ICD-10-CM

## 2022-11-13 DIAGNOSIS — I2510 Atherosclerotic heart disease of native coronary artery without angina pectoris: Secondary | ICD-10-CM

## 2022-11-13 DIAGNOSIS — N6012 Diffuse cystic mastopathy of left breast: Secondary | ICD-10-CM | POA: Diagnosis present

## 2022-11-13 DIAGNOSIS — I7300 Raynaud's syndrome without gangrene: Secondary | ICD-10-CM

## 2022-11-13 DIAGNOSIS — I2584 Coronary atherosclerosis due to calcified coronary lesion: Secondary | ICD-10-CM

## 2022-11-13 DIAGNOSIS — N3010 Interstitial cystitis (chronic) without hematuria: Secondary | ICD-10-CM

## 2022-11-13 DIAGNOSIS — E042 Nontoxic multinodular goiter: Secondary | ICD-10-CM

## 2022-11-13 DIAGNOSIS — M5136 Other intervertebral disc degeneration, lumbar region: Secondary | ICD-10-CM | POA: Diagnosis present

## 2022-11-13 DIAGNOSIS — E559 Vitamin D deficiency, unspecified: Secondary | ICD-10-CM

## 2022-11-13 DIAGNOSIS — Z902 Acquired absence of lung [part of]: Secondary | ICD-10-CM | POA: Diagnosis present

## 2022-11-13 NOTE — Addendum Note (Signed)
Addended by: Aris [place], Dystany Duffy L on: 11/13/2022 11:48 AM   Modules accepted: Orders

## 2022-11-16 LAB — ANTI-NUCLEAR AB-TITER (ANA TITER)
ANA TITER: 1:160 {titer} — ABNORMAL HIGH
ANA TITER: 1:80 {titer} — ABNORMAL HIGH
ANA Titer 1: 1:80 {titer} — ABNORMAL HIGH

## 2022-11-16 LAB — C3 AND C4
C3 Complement: 110 mg/dL (ref 83–193)
C4 Complement: 23 mg/dL (ref 15–57)

## 2022-11-16 LAB — ANTI-DNA ANTIBODY, DOUBLE-STRANDED: ds DNA Ab: 1 IU/mL

## 2022-11-16 LAB — PROTEIN / CREATININE RATIO, URINE
Creatinine, Urine: 146 mg/dL (ref 20–275)
Protein/Creat Ratio: 48 mg/g creat (ref 24–184)
Protein/Creatinine Ratio: 0.048 mg/mg creat (ref 0.024–0.184)
Total Protein, Urine: 7 mg/dL (ref 5–24)

## 2022-11-16 LAB — ANA: Anti Nuclear Antibody (ANA): POSITIVE — AB

## 2022-11-16 LAB — SEDIMENTATION RATE: Sed Rate: 11 mm/h (ref 0–30)

## 2022-11-18 NOTE — Progress Notes (Signed)
Please notify patient of the following labs.  She does not need an earlier appointment.  ANA is positive at lower titer.  Double-stranded DNA negative, complements normal, sed rate normal, protein creatinine ratio normal.  No change in treatment advised.

## 2022-11-18 NOTE — Progress Notes (Signed)
I will discuss labs at the fu visit.

## 2022-11-19 ENCOUNTER — Other Ambulatory Visit: Payer: Medicare Other

## 2022-11-20 ENCOUNTER — Other Ambulatory Visit: Payer: Medicare Other

## 2022-11-21 ENCOUNTER — Telehealth: Payer: Self-pay

## 2022-11-21 NOTE — Telephone Encounter (Signed)
Scheduled patient an virtual visit with the provider.

## 2022-11-29 ENCOUNTER — Telehealth: Payer: Self-pay | Admitting: Internal Medicine

## 2022-11-29 NOTE — Telephone Encounter (Signed)
Calcium score order was placed when patient was in office seeing Dr. Ali Lowe.  It was completed at [facility] on 11/27/22.  Will route to Dr. Ali Lowe for review and recommendations and for any input on patient's question.

## 2022-11-29 NOTE — Telephone Encounter (Signed)
Pt calling wanting to know the general range of a CT cardiac score test.

## 2022-12-02 ENCOUNTER — Encounter: Payer: Self-pay | Admitting: Internal Medicine

## 2022-12-02 ENCOUNTER — Telehealth (INDEPENDENT_AMBULATORY_CARE_PROVIDER_SITE_OTHER): Payer: Medicare Other | Admitting: Internal Medicine

## 2022-12-02 DIAGNOSIS — M6289 Other specified disorders of muscle: Secondary | ICD-10-CM | POA: Diagnosis not present

## 2022-12-02 DIAGNOSIS — M79605 Pain in left leg: Secondary | ICD-10-CM

## 2022-12-02 DIAGNOSIS — I2584 Coronary atherosclerosis due to calcified coronary lesion: Secondary | ICD-10-CM

## 2022-12-02 DIAGNOSIS — M79604 Pain in right leg: Secondary | ICD-10-CM

## 2022-12-02 DIAGNOSIS — I2510 Atherosclerotic heart disease of native coronary artery without angina pectoris: Secondary | ICD-10-CM | POA: Diagnosis not present

## 2022-12-02 NOTE — Assessment & Plan Note (Signed)
Still having tightness sensation in legs. Ordered ABI to assess for blood flow adequacy.

## 2022-12-02 NOTE — Progress Notes (Signed)
Virtual Visit via Video Note  I connected with Jocelyn Parker on 12/02/22 at  3:40 PM EST by a video enabled telemedicine application and verified that I am speaking with the correct person using two identifiers.  The patient and the provider were at separate locations throughout the entire encounter. Patient location: home, Provider location: work   I discussed the limitations of evaluation and management by telemedicine and the availability of in person appointments. The patient expressed understanding and agreed to proceed. The patient and the provider were the only parties present for the visit unless noted in HPI below.  History of Present Illness: The patient is a 72 y.o. female with visit for discuss calcium score.   Observations/Objective: Appearance: normal  Assessment and Plan: See problem oriented charting  Follow Up Instructions: reviewed CT calcium score and ordered ABI for tightness sensation in legs  Visit time 15 minutes in face to face communication with patient and coordination of care, additional 5 minutes spent in record review, coordination or care, ordering tests, communicating/referring to other healthcare professionals, documenting in medical records all on the same day of the visit for total time 20 minutes spent on the visit.    I discussed the assessment and treatment plan with the patient. The patient was provided an opportunity to ask questions and all were answered. The patient agreed with the plan and demonstrated an understanding of the instructions.   The patient was advised to call back or seek an in-person evaluation if the symptoms worsen or if the condition fails to improve as anticipated.  Hoyt Koch, MD

## 2022-12-02 NOTE — Assessment & Plan Note (Signed)
Counseled about recent calcium score 50-75th percentile and progression from 2021. She will consider statin medication. Counseled about diet and exercise as well.

## 2022-12-03 ENCOUNTER — Encounter: Payer: Self-pay | Admitting: Internal Medicine

## 2022-12-05 ENCOUNTER — Ambulatory Visit (HOSPITAL_COMMUNITY)
Admission: RE | Admit: 2022-12-05 | Discharge: 2022-12-05 | Disposition: A | Discharge status: Home / Self Care | Payer: Medicare Other | Source: Ambulatory Visit | Attending: Internal Medicine | Admitting: Internal Medicine

## 2022-12-05 DIAGNOSIS — M6289 Other specified disorders of muscle: Secondary | ICD-10-CM | POA: Insufficient documentation

## 2022-12-08 LAB — VAS US LOWER EXT ART SEG MULTI (SEGMENTALS & LE RAYNAUDS)
Left ABI: 1.32
Right ABI: 1.32

## 2022-12-11 ENCOUNTER — Telehealth (INDEPENDENT_AMBULATORY_CARE_PROVIDER_SITE_OTHER): Payer: Medicare Other | Admitting: Internal Medicine

## 2022-12-11 DIAGNOSIS — M79604 Pain in right leg: Secondary | ICD-10-CM

## 2022-12-11 DIAGNOSIS — I2584 Coronary atherosclerosis due to calcified coronary lesion: Secondary | ICD-10-CM | POA: Diagnosis not present

## 2022-12-11 DIAGNOSIS — I2510 Atherosclerotic heart disease of native coronary artery without angina pectoris: Secondary | ICD-10-CM

## 2022-12-11 DIAGNOSIS — M79605 Pain in left leg: Secondary | ICD-10-CM

## 2022-12-11 NOTE — Progress Notes (Unsigned)
Virtual Visit via Video Note  I connected with Jocelyn Parker on 12/11/22 at 11:00 AM EST by a video enabled telemedicine application and verified that I am speaking with the correct person using two identifiers.  The patient and the provider were at separate locations throughout the entire encounter. Patient location: home, Provider location: work   I discussed the limitations of evaluation and management by telemedicine and the availability of in person appointments. The patient expressed understanding and agreed to proceed. The patient and the provider were the only parties present for the visit unless noted in HPI below.  History of Present Illness: The patient is a 72 y.o. female with visit for discussion about recent ABI study.  Fax: [phone]  Observations/Objective: Appearance: normal, breathing appears normal, casual grooming  Assessment and Plan: See problem oriented charting  Follow Up Instructions: likely neurological sensation of tightness we discussed blood flow is adequate  Visit time 15 minutes in face to face communication with patient and coordination of care, additional 5 minutes spent in record review, coordination or care, ordering tests, communicating/referring to other healthcare professionals, documenting in medical records all on the same day of the visit for total time 20 minutes spent on the visit.    I discussed the assessment and treatment plan with the patient. The patient was provided an opportunity to ask questions and all were answered. The patient agreed with the plan and demonstrated an understanding of the instructions.   The patient was advised to call back or seek an in-person evaluation if the symptoms worsen or if the condition fails to improve as anticipated.  Hoyt Koch, MD

## 2022-12-12 ENCOUNTER — Encounter: Payer: Self-pay | Admitting: Internal Medicine

## 2022-12-12 NOTE — Assessment & Plan Note (Signed)
Still having sensation of tightness in the legs. We discussed adequate blood flow with some likely plaque in the legs which is non-obstructive at this time. She does not wish treatment of cholesterol at this time. Likely sensation could be neurological and she wishes to return to her neurologist for discussion.

## 2023-01-14 ENCOUNTER — Ambulatory Visit (INDEPENDENT_AMBULATORY_CARE_PROVIDER_SITE_OTHER): Payer: Medicare Other | Admitting: Family Medicine

## 2023-01-14 ENCOUNTER — Encounter: Payer: Self-pay | Admitting: Family Medicine

## 2023-01-14 ENCOUNTER — Ambulatory Visit: Payer: Medicare Other | Admitting: Internal Medicine

## 2023-01-14 VITALS — BP 90/60 | HR 76 | Temp 98.1°F | Ht 63.5 in | Wt 113.8 lb

## 2023-01-14 DIAGNOSIS — J0100 Acute maxillary sinusitis, unspecified: Secondary | ICD-10-CM

## 2023-01-14 DIAGNOSIS — R5383 Other fatigue: Secondary | ICD-10-CM

## 2023-01-14 DIAGNOSIS — E538 Deficiency of other specified B group vitamins: Secondary | ICD-10-CM

## 2023-01-14 DIAGNOSIS — C3491 Malignant neoplasm of unspecified part of right bronchus or lung: Secondary | ICD-10-CM | POA: Diagnosis not present

## 2023-01-14 LAB — COMPREHENSIVE METABOLIC PANEL
ALT: 8 U/L (ref 0–35)
AST: 13 U/L (ref 0–37)
Albumin: 3.6 g/dL (ref 3.5–5.2)
Alkaline Phosphatase: 58 U/L (ref 39–117)
BUN: 15 mg/dL (ref 6–23)
CO2: 28 mEq/L (ref 19–32)
Calcium: 9.3 mg/dL (ref 8.4–10.5)
Chloride: 102 mEq/L (ref 96–112)
Creatinine, Ser: 0.78 mg/dL (ref 0.40–1.20)
GFR: 77.2 mL/min (ref >60.00)
Glucose, Bld: 85 mg/dL (ref 70–99)
Potassium: 4.2 mEq/L (ref 3.5–5.1)
Sodium: 139 mEq/L (ref 135–145)
Total Bilirubin: 0.5 mg/dL (ref 0.2–1.2)
Total Protein: 6.3 g/dL (ref 6.0–8.3)

## 2023-01-14 LAB — CBC WITH DIFFERENTIAL/PLATELET
Basophils Absolute: 0 10*3/uL (ref 0.0–0.1)
Basophils Relative: 0.4 % (ref 0.0–3.0)
Eosinophils Absolute: 0 10*3/uL (ref 0.0–0.7)
Eosinophils Relative: 0.9 % (ref 0.0–5.0)
HCT: 38.3 % (ref 36.0–46.0)
Hemoglobin: 12.9 g/dL (ref 12.0–15.0)
Lymphocytes Relative: 24.7 % (ref 12.0–46.0)
Lymphs Abs: 1.1 10*3/uL (ref 0.7–4.0)
MCHC: 33.7 g/dL (ref 30.0–36.0)
MCV: 91.8 fl (ref 78.0–100.0)
Monocytes Absolute: 0.4 10*3/uL (ref 0.1–1.0)
Monocytes Relative: 9.5 % (ref 3.0–12.0)
Neutro Abs: 2.8 10*3/uL (ref 1.4–7.7)
Neutrophils Relative %: 64.5 % (ref 43.0–77.0)
Platelets: 277 10*3/uL (ref 150.0–400.0)
RBC: 4.17 Mil/uL (ref 3.87–5.11)
RDW: 14.3 % (ref 11.5–15.5)
WBC: 4.4 10*3/uL (ref 4.0–10.5)

## 2023-01-14 LAB — TSH: TSH: 0.55 u[IU]/mL (ref 0.35–5.50)

## 2023-01-14 LAB — VITAMIN B12: Vitamin B-12: 523 pg/mL (ref 211–911)

## 2023-01-14 MED ORDER — AMOXICILLIN-POT CLAVULANATE 875-125 MG PO TABS: 1.0000 | ORAL_TABLET | Freq: Two times a day (BID) | ORAL | 0 refills | Status: AC

## 2023-01-14 NOTE — Patient Instructions (Signed)
Please follow up if symptoms do not improve or as needed.    Please let us know if you are not feeling better.  Take the antibiotic twice daily and mucinex DM. Consider taking an allergy pill as well. You may use your flonase daily.   I will release your lab results to you on your MyChart account with further instructions. You may see the results before I do, but when I review them I will send you a message with my report or have my assistant call you if things need to be discussed. Please reply to my message with any questions. Thank you!

## 2023-01-14 NOTE — Progress Notes (Signed)
Subjective  CC:  Chief Complaint  Patient presents with   Headache    Pt stated that she has been having headache for the past 2/[redacted] weeks along with sinus pressure   Same day acute visit; PCP not available. New pt to me. Chart reviewed.   HPI: Jocelyn Parker is a 72 y.o. female who presents to the office today to address the problems listed above in the chief complaint. 72 year old female with history of lung cancer status posttreatment and interstitial cystitis who presents with 2 to 3-week history of feeling off.  She describes her symptoms of started with some sinus pressure, postnasal drainage, some sneezing, frontal and temporal headaches, eyes burning and fatigue.  Minimal cough.  No shortness of breath.  No palpitations or chest pain.  No GI symptoms although her appetite is low in general.  No fevers.  She did take a COVID test which was negative.  Energy level and sinus pressure symptoms are persistent, thus the evaluation.  She has a history of sinus infections although rarely.  She has no history of thyroid problems or chronic anemia.  She does have B12 deficiency and is overdue for her injection. She denies lightheadedness, rashes, nausea or vomiting  Assessment  1. Subacute maxillary sinusitis   2. Other fatigue   3. B12 deficiency   4. Adenocarcinoma of lung, stage 1, right (HCC)      Plan  Symptom complex most consistent with sinusitis and/or allergies: Will treat allergies with Flonase and oral antihistamine.  Cover for sinus infection with Augmentin 875 twice daily.  Continue Mucinex as needed.  Advil if needed for headache.  Follow-up if not improving Rule out other causes of fatigue with lab work including CBC, B12 level and CMP.  Follow up: If not improving Visit date not found  Orders Placed This Encounter  Procedures   CBC with Differential/Platelet   Comprehensive metabolic panel   TSH   Vitamin B12   Meds ordered this encounter  Medications    amoxicillin-clavulanate (AUGMENTIN) 875-125 MG tablet    Sig: Take 1 tablet by mouth 2 (two) times daily for 10 days.    Dispense:  20 tablet    Refill:  0      I reviewed the patients updated PMH, FH, and SocHx.    Patient Active Problem List   Diagnosis Date Noted   Coronary artery calcification 12/02/2022   Leg pain 11/12/2022   Familial hypercholesterolemia 02/12/2022   Celiac disease 07/17/2021   Impaired fasting glucose 07/17/2021   Vertigo 03/25/2021   Gastritis with intestinal metaplasia of stomach 01/10/2021   S/P partial lobectomy of lung 04/12/2020   Nodule of upper lobe of right lung 04/05/2020   Bilateral breast cysts 12/27/2019   Multiple cysts of breast 12/27/2019   Malignant neoplasm of upper lobe of right lung ([place]) 10/12/2019   Sensorineural hearing loss (SNHL) of both ears 06/20/2019   Adenocarcinoma of lung, stage 1, right ([place]) 05/26/2019   Tinnitus of both ears 05/12/2019   B12 deficiency 04/11/2019   Cyst of ovary 04/01/2019   Menopause syndrome 04/01/2019   Osteopenia 04/01/2019   Vitamin D deficiency 05/24/2015   Xanthoma of eyelid 12/21/2014   Cyst of kidney, acquired 12/20/2014   Interstitial cystitis (chronic) without hematuria 03/20/2012   Shortness of breath 08/16/2008   Nontoxic multinodular goiter 08/08/2007   Current Meds  Medication Sig   amoxicillin-clavulanate (AUGMENTIN) 875-125 MG tablet Take 1 tablet by mouth 2 (two) times daily for 10  days.   calcium carbonate (TUMS - DOSED IN MG ELEMENTAL CALCIUM) 500 MG chewable tablet 1 tablet   cyanocobalamin (,VITAMIN B-12,) 1000 MCG/ML injection Inject 1 mL (1,000 mcg total) into the muscle every 21 ( twenty-one) days.   ergocalciferol (VITAMIN D2) 1.25 MG (50000 UT) capsule Take 1 capsule (50,000 Units total) by mouth every 30 (thirty) days.   estradiol (VIVELLE-DOT) 0.1 MG/24HR Place 1 patch (0.1 mg total) onto the skin 2 (two) times a week.   fluticasone (FLONASE) 50 MCG/ACT nasal spray  Place 2 sprays into both nostrils as needed. SHAKE LIQUID AND USE 2 SPRAYS IN EACH NOSTRIL DAILY   ibuprofen (ADVIL) 800 MG tablet Take 1 tablet (800 mg total) by mouth every 8 (eight) hours as needed. ibuprofen 800 mg tablet   levothyroxine (SYNTHROID) 50 MCG tablet Take 50 mcg by mouth daily before breakfast.   levothyroxine (SYNTHROID, LEVOTHROID) 75 MCG tablet Take 75 mcg by mouth daily.   meclizine (ANTIVERT) 25 MG tablet Take 1 tablet (25 mg total) by mouth 3 (three) times daily as needed for dizziness. Take 1-2 tablets as needed every 8 hours for dizziness.   Syringe/Needle, Disp, (SYRINGE 3CC/25GX1") 25G X 1" 3 ML MISC One injection monthly   tiZANidine (ZANAFLEX) 4 MG tablet Take 2-4 mg by mouth at bedtime.   UNABLE TO FIND Med Name: CBD Cream    Allergies: Patient is allergic to sulfa antibiotics, montelukast sodium, and vitamin d (calciferol). Family History: Patient family history includes Cancer in her brother; Heart attack in her father and paternal grandfather; Multiple sclerosis in her sister; Renal cancer in her mother; Stroke in her maternal grandmother. Social History:  Patient  reports that she has never smoked. She has never been exposed to tobacco smoke. She has never used smokeless tobacco. She reports that she does not drink alcohol and does not use drugs.  Review of Systems: Constitutional: Negative for fever malaise or anorexia Cardiovascular: negative for chest pain Respiratory: negative for SOB or persistent cough Gastrointestinal: negative for abdominal pain  Objective  Vitals: BP 90/60   Pulse 76   Temp 98.1 F (36.7 C)   Ht 5' 3.5" (1.613 m)   Wt 113 lb 12.8 oz (51.6 kg)   SpO2 99%   BMI 19.84 kg/m  General: no acute distress , A&Ox3 HEENT: PEERL, conjunctiva normal, neck is supple, TMs normal, nasal mucosa is boggy, left sinus maxillary tenderness is present, no cervical lymphadenopathy, supple neck Cardiovascular:  RRR without murmur or gallop.   Respiratory:  Good breath sounds bilaterally, CTAB with normal respiratory effort Skin:  Warm, no rashes  Commons side effects, risks, benefits, and alternatives for medications and treatment plan prescribed today were discussed, and the patient expressed understanding of the given instructions. Patient is instructed to call or message via MyChart if he/she has any questions or concerns regarding our treatment plan. No barriers to understanding were identified. We discussed Red Flag symptoms and signs in detail. Patient expressed understanding regarding what to do in case of urgent or emergency type symptoms.  Medication list was reconciled, printed and provided to the patient in AVS. Patient instructions and summary information was reviewed with the patient as documented in the AVS. This note was prepared with assistance of Dragon voice recognition software. Occasional wrong-word or sound-a-like substitutions may have occurred due to the inherent limitations of voice recognition software

## 2023-01-16 ENCOUNTER — Telehealth: Payer: Self-pay | Admitting: Internal Medicine

## 2023-01-16 NOTE — Telephone Encounter (Signed)
Patient states: -Saw Dr. Jonni Sanger on 3/12 and was given an abx - Believes she is having side effects due to the medication  - Concerned and wanted to speak with the nurse  Patient has been transferred to PCP triage nurse.

## 2023-01-17 NOTE — Telephone Encounter (Signed)
Pt was transferred to the triage nurse on yesterday. Per chart pt is coming for f/u on Monday 01/20/23.Marland KitchenJohny Parker

## 2023-01-17 NOTE — Telephone Encounter (Signed)
I don't see any question, is there a question for me?

## 2023-01-20 ENCOUNTER — Encounter: Payer: Self-pay | Admitting: Internal Medicine

## 2023-01-20 ENCOUNTER — Ambulatory Visit (INDEPENDENT_AMBULATORY_CARE_PROVIDER_SITE_OTHER): Payer: Medicare Other | Admitting: Internal Medicine

## 2023-01-20 DIAGNOSIS — E538 Deficiency of other specified B group vitamins: Secondary | ICD-10-CM | POA: Diagnosis not present

## 2023-01-20 MED ORDER — CYANOCOBALAMIN 1000 MCG/ML IJ SOLN: 1000.0000 ug | INTRAMUSCULAR | 3 refills | Status: DC

## 2023-01-20 MED ORDER — "SYRINGE 25G X 1"" 3 ML MISC": 11 refills | Status: DC

## 2023-01-20 NOTE — Progress Notes (Unsigned)
   Subjective:   Patient ID: Jocelyn Parker, female    DOB: Apr 28, 1951, 72 y.o.   MRN: [id number]  HPI The patient is a 72 YO female coming in for follow up.  Review of Systems  Constitutional:  Positive for fatigue.  HENT: Negative.    Eyes: Negative.   Respiratory:  Negative for cough, chest tightness and shortness of breath.   Cardiovascular:  Negative for chest pain, palpitations and leg swelling.  Gastrointestinal:  Negative for abdominal distention, abdominal pain, constipation, diarrhea, nausea and vomiting.  Musculoskeletal: Negative.   Skin: Negative.   Neurological: Negative.   Psychiatric/Behavioral: Negative.      Objective:  Physical Exam Constitutional:      Appearance: She is well-developed.  HENT:     Head: Normocephalic and atraumatic.  Cardiovascular:     Rate and Rhythm: Normal rate and regular rhythm.  Pulmonary:     Effort: Pulmonary effort is normal. No respiratory distress.     Breath sounds: Normal breath sounds. No wheezing or rales.  Abdominal:     General: Bowel sounds are normal. There is no distension.     Palpations: Abdomen is soft.     Tenderness: There is no abdominal tenderness. There is no rebound.  Musculoskeletal:     Cervical back: Normal range of motion.  Skin:    General: Skin is warm and dry.  Neurological:     Mental Status: She is alert and oriented to person, place, and time.     Coordination: Coordination normal.     Vitals:   01/20/23 1525  BP: 120/80  Pulse: 72  Temp: 98 F (36.7 C)  TempSrc: Oral  SpO2: 96%  Weight: 115 lb (52.2 kg)  Height: 5' 3.5" (1.613 m)    Assessment & Plan:  Visit time 15 minutes in face to face communication with patient and coordination of care, additional 5 minutes spent in record review, coordination or care, ordering tests, communicating/referring to other healthcare professionals, documenting in medical records all on the same day of the visit for total time 20 minutes spent on the  visit.

## 2023-01-21 ENCOUNTER — Encounter: Payer: Self-pay | Admitting: Internal Medicine

## 2023-01-21 NOTE — Assessment & Plan Note (Signed)
Prefers to do monthly B12 injections at home. Refilled B12 vials today and the syringes/needles.

## 2023-01-29 ENCOUNTER — Other Ambulatory Visit: Payer: Self-pay | Admitting: Internal Medicine

## 2023-01-29 DIAGNOSIS — E559 Vitamin D deficiency, unspecified: Secondary | ICD-10-CM

## 2023-03-06 ENCOUNTER — Telehealth: Payer: Self-pay | Admitting: Radiology

## 2023-03-06 NOTE — Telephone Encounter (Signed)
Contacted Jocelyn Parker to schedule their annual wellness visit. Patient declined to schedule AWV at this time.   Jocelyn Parker CMA

## 2023-07-24 ENCOUNTER — Encounter: Payer: Self-pay | Admitting: Internal Medicine

## 2023-07-27 ENCOUNTER — Encounter (HOSPITAL_BASED_OUTPATIENT_CLINIC_OR_DEPARTMENT_OTHER): Payer: Self-pay

## 2023-07-27 ENCOUNTER — Other Ambulatory Visit: Payer: Self-pay

## 2023-07-27 ENCOUNTER — Emergency Department (HOSPITAL_BASED_OUTPATIENT_CLINIC_OR_DEPARTMENT_OTHER): Payer: Medicare Other

## 2023-07-27 ENCOUNTER — Emergency Department (HOSPITAL_BASED_OUTPATIENT_CLINIC_OR_DEPARTMENT_OTHER)
Admission: EM | Admit: 2023-07-27 | Discharge: 2023-07-27 | Disposition: A | Discharge status: Home / Self Care | Payer: Medicare Other | Attending: Emergency Medicine | Admitting: Emergency Medicine

## 2023-07-27 DIAGNOSIS — R600 Localized edema: Secondary | ICD-10-CM | POA: Insufficient documentation

## 2023-07-27 DIAGNOSIS — R2243 Localized swelling, mass and lump, lower limb, bilateral: Secondary | ICD-10-CM | POA: Insufficient documentation

## 2023-07-27 LAB — CBC WITH DIFFERENTIAL/PLATELET
Abs Immature Granulocytes: 0 10*3/uL (ref 0.00–0.07)
Basophils Absolute: 0 10*3/uL (ref 0.0–0.1)
Basophils Relative: 1 %
Eosinophils Absolute: 0.1 10*3/uL (ref 0.0–0.5)
Eosinophils Relative: 1 %
HCT: 42.7 % (ref 36.0–46.0)
Hemoglobin: 14.2 g/dL (ref 12.0–15.0)
Immature Granulocytes: 0 %
Lymphocytes Relative: 36 %
Lymphs Abs: 1.4 10*3/uL (ref 0.7–4.0)
MCH: 30.8 pg (ref 26.0–34.0)
MCHC: 33.3 g/dL (ref 30.0–36.0)
MCV: 92.6 fL (ref 80.0–100.0)
Monocytes Absolute: 0.4 10*3/uL (ref 0.1–1.0)
Monocytes Relative: 10 %
Neutro Abs: 2.1 10*3/uL (ref 1.7–7.7)
Neutrophils Relative %: 52 %
Platelets: 280 10*3/uL (ref 150–400)
RBC: 4.61 MIL/uL (ref 3.87–5.11)
RDW: 14.1 % (ref 11.5–15.5)
WBC: 4 10*3/uL (ref 4.0–10.5)
nRBC: 0 % (ref 0.0–0.2)

## 2023-07-27 LAB — BASIC METABOLIC PANEL
Anion gap: 13 (ref 5–15)
BUN: 9 mg/dL (ref 8–23)
CO2: 26 mmol/L (ref 22–32)
Calcium: 9.3 mg/dL (ref 8.9–10.3)
Chloride: 101 mmol/L (ref 98–111)
Creatinine, Ser: 0.96 mg/dL (ref 0.44–1.00)
GFR, Estimated: >60 mL/min (ref >60)
Glucose, Bld: 63 mg/dL — ABNORMAL LOW (ref 70–99)
Potassium: 4.1 mmol/L (ref 3.5–5.1)
Sodium: 140 mmol/L (ref 135–145)

## 2023-07-27 LAB — BRAIN NATRIURETIC PEPTIDE: B Natriuretic Peptide: 28.9 pg/mL (ref 0.0–100.0)

## 2023-07-27 NOTE — ED Notes (Signed)
Patient transported to Ultrasound 

## 2023-07-27 NOTE — ED Triage Notes (Addendum)
The patient is having swelling in her right ankle pain for one and a half weeks. She stated it is getting worse. She was seen at urgent care and they sent her here for an ultrasound. No new injury. No shortness of breath.

## 2023-07-27 NOTE — ED Provider Notes (Signed)
[facility] Provider Note   CSN: [id number] Arrival date & time: 07/27/23  1652     History Chief Complaint  Patient presents with   Leg Swelling    HPI Jayna Gilfillan is a 72 y.o. female presenting for bilateral lower extremity swelling over the past 3 weeks. States that her legs are feeling tight today so she comes in for further care and management.  Denies fevers chills nausea vomiting syncope or shortness of breath otherwise ambulatory tolerating p.o. intake. Has been trying leg elevation at night but feels that her left leg is getting worse.   Patient's recorded medical, surgical, social, medication list and allergies were reviewed in the Snapshot window as part of the initial history.   Review of Systems   Review of Systems  Constitutional:  Negative for chills and fever.  HENT:  Negative for ear pain and sore throat.   Eyes:  Negative for pain and visual disturbance.  Respiratory:  Negative for cough and shortness of breath.   Cardiovascular:  Positive for leg swelling. Negative for chest pain and palpitations.  Gastrointestinal:  Negative for abdominal pain and vomiting.  Genitourinary:  Negative for dysuria and hematuria.  Musculoskeletal:  Negative for arthralgias and back pain.  Skin:  Negative for color change and rash.  Neurological:  Negative for seizures and syncope.  All other systems reviewed and are negative.   Physical Exam Updated Vital Signs BP 129/67   Pulse 76   Temp 97.7 F (36.5 C) (Oral)   Resp 18   Ht 5\' 3"  (1.6 m)   Wt 52 kg   SpO2 99%   BMI 20.31 kg/m  Physical Exam Vitals and nursing note reviewed.  Constitutional:      General: She is not in acute distress.    Appearance: She is well-developed.  HENT:     Head: Normocephalic and atraumatic.  Eyes:     Conjunctiva/sclera: Conjunctivae normal.  Cardiovascular:     Rate and Rhythm: Normal rate and regular rhythm.     Heart sounds: No  murmur heard. Pulmonary:     Effort: Pulmonary effort is normal. No respiratory distress.     Breath sounds: Normal breath sounds.  Abdominal:     Palpations: Abdomen is soft.     Tenderness: There is no abdominal tenderness.  Musculoskeletal:        General: No swelling.     Cervical back: Neck supple.     Right lower leg: Edema present.     Left lower leg: Edema present.     Comments: 1+ edema  Skin:    General: Skin is warm and dry.     Capillary Refill: Capillary refill takes less than 2 seconds.  Neurological:     Mental Status: She is alert.  Psychiatric:        Mood and Affect: Mood normal.      ED Course/ Medical Decision Making/ A&P    Procedures Procedures   Medications Ordered in ED Medications - No data to display  Medical Decision Making:    Fayth Soth is a 72 y.o. female who presented to the ED today with bilateral lower extremity swelling detailed above.     Patient placed on continuous vitals and telemetry monitoring while in ED which was reviewed periodically.   Complete initial physical exam performed, notably the patient  was hemodynamically stable in no acute distress with mild bilateral lower extremity swelling.  Reviewed and confirmed nursing documentation for past medical history, family history, social history.    Initial Assessment:   With the patient's presentation of leg swelling, most likely diagnosis is nonspecific etiology such as venous stasis. Other diagnoses were considered including (but not limited to) nephrotic syndrome, developing heart failure, DVT. These are considered less likely due to history of present illness and physical exam findings.   This is most consistent with an acute life/limb threatening illness complicated by underlying chronic conditions.  Initial Plan:  Bilateral DVT study to evaluate for thrombotic disease Screening labs including CBC and Metabolic panel to evaluate for infectious or metabolic etiology  of disease.  BNP/EKG to evaluate for cardiac pathology. Objective evaluation as below reviewed with plan for close reassessment  Initial Study Results:   Laboratory  All laboratory results reviewed without evidence of clinically relevant pathology.     EKG EKG was reviewed independently. Rate, rhythm, axis, intervals all examined and without medically relevant abnormality. ST segments without concerns for elevations.    Reassessment and Plan:   No acute pathology on any of the studies patient stable for outpatient care management follow-up with PCP.    Disposition:  I have considered need for hospitalization, however, considering all of the above, I believe this patient is stable for discharge at this time.  Patient/family educated about specific return precautions for given chief complaint and symptoms.   Patient/family expressed understanding of return precautions and need for follow-up. Patient spoken to regarding all imaging and laboratory results and appropriate follow up for these results. All education provided in verbal form with additional information in written form. Time was allowed for answering of patient questions. Patient discharged.    Emergency Department Medication Summary:   Medications - No data to display       Clinical Impression: No diagnosis found.   Data Unavailable   Final Clinical Impression(s) / ED Diagnoses Final diagnoses:  None    Rx / DC Orders ED Discharge Orders     None         Glyn Ade, MD 07/27/23 2244

## 2023-07-27 NOTE — ED Notes (Signed)
Attempted blood draw and IV start x 2, unsuccessful, [place] in room, will retry after completed

## 2023-07-28 ENCOUNTER — Ambulatory Visit (INDEPENDENT_AMBULATORY_CARE_PROVIDER_SITE_OTHER): Payer: Medicare Other | Admitting: Emergency Medicine

## 2023-07-28 ENCOUNTER — Ambulatory Visit (INDEPENDENT_AMBULATORY_CARE_PROVIDER_SITE_OTHER): Payer: Medicare Other

## 2023-07-28 ENCOUNTER — Encounter: Payer: Self-pay | Admitting: Emergency Medicine

## 2023-07-28 VITALS — BP 108/68 | HR 72 | Temp 98.1°F | Ht 63.0 in | Wt 118.2 lb

## 2023-07-28 DIAGNOSIS — M25571 Pain in right ankle and joints of right foot: Secondary | ICD-10-CM | POA: Diagnosis not present

## 2023-07-28 NOTE — Patient Instructions (Signed)
Ankle Pain The ankle joint helps you stand on your leg and allows you to move around. Ankle pain can happen on either side or the back of the ankle. You may have pain in one ankle or both ankles. Ankle pain may be sharp and burning or dull and aching. There may be tenderness, stiffness, redness, or warmth around the ankle. Many things can cause ankle pain. These include an injury to the area and overuse of your ankle. Follow these instructions at home: Activity Rest your ankle as told by your health care provider. Avoid doing things that cause ankle pain. Do not use the injured limb to support your body weight until your provider says that you can. Use crutches as told by your provider. Ask your provider when it is safe to drive if you have a brace on your ankle. Do exercises as told by your provider. If you have a removable brace: Wear the brace as told by your provider. Remove it only as told by your provider. Check the skin around the brace every day. Tell your provider about any concerns. Loosen the brace if your toes tingle, become numb, or turn cold and blue. Keep the brace clean. If the brace is not waterproof: Do not let it get wet. Cover it with a watertight covering when you take a bath or shower. If you have an elastic bandage:  Remove it when you take a bath or a shower. Try not to move your ankle much. Wiggle your toes from time to time. This helps to prevent swelling. Adjust the bandage if it feels too tight. Loosen the bandage if your foot tingles, becomes numb, or turns cold and blue. Managing pain, stiffness, and swelling  If told, put ice on the painful area. If you have a removable brace or elastic bandage, remove it as told by your provider. Put ice in a plastic bag. Place a towel between your skin and the bag. Leave the ice on for 20 minutes, 2-3 times a day. If your skin turns bright red, remove the ice right away to prevent skin damage. The risk of damage is  higher if you cannot feel pain, heat, or cold. Move your toes often to reduce stiffness and swelling. Raise (elevate) your ankle above the level of your heart while you are sitting or lying down. General instructions Take over-the-counter and prescription medicines only as told by your provider. To help you and your provider, write down: How often you have ankle pain. Where the pain is. What the pain feels like. If you are told to wear a certain shoe or insole, make sure you wear it the right way and for as long as you are told. Contact a health care provider if: Your pain gets worse. Your pain does not get better with medicine. You have a fever or chills. You have more trouble walking. You have new symptoms. Your foot, leg, toes, or ankle tingles, becomes numb or swollen, or turns cold and blue. This information is not intended to replace advice given to you by your health care provider. Make sure you discuss any questions you have with your health care provider. Document Revised: 08/14/2022 Document Reviewed: 08/14/2022 Elsevier Patient Education  2024 [employer].

## 2023-07-28 NOTE — Progress Notes (Signed)
Jocelyn Parker 72 y.o.   Chief Complaint  Patient presents with   Follow-up    Seen in ED on 9/22 for leg edema  Patient states she is having some pain in her right ankle and tightness in both legs.     HISTORY OF PRESENT ILLNESS: Acute problem visit today. This is a 71 y.o. female complaining of pain to right ankle area that started 4 days ago without any specific injury Went to the emergency department yesterday due to pain.  Had negative leg ultrasound.  Negative for DVT Unremarkable blood work. No other associated symptoms. No other complaints or medical concerns today.  HPI   Prior to Admission medications   Medication Sig Start Date End Date Taking? Authorizing Provider  calcium carbonate (TUMS - DOSED IN MG ELEMENTAL CALCIUM) 500 MG chewable tablet 1 tablet   Yes [provider]  cyanocobalamin (VITAMIN B12) 1000 MCG/ML injection Inject 1 mL (1,000 mcg total) into the muscle every 21 ( twenty-one) days. 01/20/23  Yes Myrlene Broker, MD  estradiol (VIVELLE-DOT) 0.1 MG/24HR Place 1 patch (0.1 mg total) onto the skin 2 (two) times a week. 03/21/12  Yes Haygood, Maris Berger, MD  fluticasone (FLONASE) 50 MCG/ACT nasal spray Place 2 sprays into both nostrils as needed. SHAKE LIQUID AND USE 2 SPRAYS IN EACH NOSTRIL DAILY 05/03/22  Yes Myrlene Broker, MD  ibuprofen (ADVIL) 800 MG tablet Take 1 tablet (800 mg total) by mouth every 8 (eight) hours as needed. ibuprofen 800 mg tablet 01/24/21  Yes Orland Mustard, MD  levothyroxine (SYNTHROID) 50 MCG tablet Take 50 mcg by mouth daily before breakfast.   Yes [provider]  levothyroxine (SYNTHROID, LEVOTHROID) 75 MCG tablet Take 75 mcg by mouth daily.   Yes [provider]  meclizine (ANTIVERT) 25 MG tablet Take 1 tablet (25 mg total) by mouth 3 (three) times daily as needed for dizziness. Take 1-2 tablets as needed every 8 hours for dizziness. 01/07/22  Yes Terrilee Files, MD  Syringe/Needle, Disp,  (SYRINGE 3CC/25GX1") 25G X 1" 3 ML MISC One injection monthly 01/20/23  Yes Myrlene Broker, MD  tiZANidine (ZANAFLEX) 4 MG tablet Take 2-4 mg by mouth at bedtime.   Yes [provider]  UNABLE TO FIND Med Name: CBD Cream   Yes [provider]  Vitamin D, Ergocalciferol, (DRISDOL) 1.25 MG (50000 UNIT) CAPS capsule TAKE 1 CAPSULE BY MOUTH EVERY 30 DAYS 01/30/23  Yes Myrlene Broker, MD    Allergies  Allergen Reactions   Sulfa Antibiotics Hives   Montelukast Sodium     dizziness   Vitamin D (Calciferol) Other (See Comments)    Pt stated can only take prescription     Patient Active Problem List   Diagnosis Date Noted   Coronary artery calcification 12/02/2022   Leg pain 11/12/2022   Familial hypercholesterolemia 02/12/2022   Celiac disease 07/17/2021   Impaired fasting glucose 07/17/2021   Vertigo 03/25/2021   Gastritis with intestinal metaplasia of stomach 01/10/2021   S/P partial lobectomy of lung 04/12/2020   Nodule of upper lobe of right lung 04/05/2020   Bilateral breast cysts 12/27/2019   Multiple cysts of breast 12/27/2019   Malignant neoplasm of upper lobe of right lung (HCC) 10/12/2019   Sensorineural hearing loss (SNHL) of both ears 06/20/2019   Adenocarcinoma of lung, stage 1, right (HCC) 05/26/2019   Tinnitus of both ears 05/12/2019   B12 deficiency 04/11/2019   Cyst of ovary 04/01/2019   Menopause  syndrome 04/01/2019   Osteopenia 04/01/2019   Vitamin D deficiency 05/24/2015   Xanthoma of eyelid 12/21/2014   Cyst of kidney, acquired 12/20/2014   Interstitial cystitis (chronic) without hematuria 03/20/2012   Shortness of breath 08/16/2008   Nontoxic multinodular goiter 08/08/2007    Past Medical History:  Diagnosis Date   Allergy    sulfa   Chronic interstitial cystitis    Lung cancer (HCC)    per patient    Thyroid disease     Past Surgical History:  Procedure Laterality Date   ABDOMINAL HYSTERECTOMY     lumps removed from  breast     1970's ,1990's and in 2000's  nono cancer    LUNG SURGERY  04/05/2020    Social History   Socioeconomic History   Marital status: Married    Spouse name: Not on file   Number of children: Not on file   Years of education: Not on file   Highest education level: Not on file  Occupational History   Occupation: Retired  Tobacco Use   Smoking status: Never    Passive exposure: Never   Smokeless tobacco: Never  Vaping Use   Vaping status: Never Used  Substance and Sexual Activity   Alcohol use: No   Drug use: No   Sexual activity: Yes    Birth control/protection: Surgical    Comment: hyst  Other Topics Concern   Not on file  Social History Narrative   Not on file   Social Determinants of Health   Financial Resource Strain: Low Risk  (05/29/2023)   Received from [facility]   Overall Financial Resource Strain (CARDIA)    Difficulty of Paying Living Expenses: Not hard at all  Food Insecurity: No Food Insecurity (05/29/2023)   Received from [facility]   Hunger Vital Sign    Worried About Running Out of Food in the Last Year: Never true    Ran Out of Food in the Last Year: Never true  Transportation Needs: No Transportation Needs (05/29/2023)   Received from [facility] - Transportation    Lack of Transportation (Medical): No    Lack of Transportation (Non-Medical): No  Physical Activity: Insufficiently Active (05/29/2023)   Received from [facility]   Exercise Vital Sign    Days of Exercise per Week: 1 day    Minutes of Exercise per Session: 50 min  Stress: No Stress Concern Present (05/29/2023)   Received from [facility] - Occupational Stress Questionnaire    Feeling of Stress : Not at all  Social Connections: Moderately Integrated (05/29/2023)   Received from [facility]   Social Network    How would you rate your social network (family, work, friends)?: Adequate participation with social  networks  Intimate Partner Violence: Not At Risk (05/29/2023)   Received from [facility]   HITS    Over the last 12 months how often did your partner physically hurt you?: 1    Over the last 12 months how often did your partner insult you or talk down to you?: 1    Over the last 12 months how often did your partner threaten you with physical harm?: 1    Over the last 12 months how often did your partner scream or curse at you?: 1    Family History  Problem Relation Age of Onset   Renal cancer Mother    Heart attack Father    Stroke  Maternal Grandmother    Heart attack Paternal Grandfather    Multiple sclerosis Sister    Cancer Brother      Review of Systems  Constitutional: Negative.  Negative for chills and fever.  HENT: Negative.  Negative for congestion and sore throat.   Respiratory: Negative.  Negative for cough and shortness of breath.   Cardiovascular: Negative.  Negative for chest pain and palpitations.  Gastrointestinal:  Negative for abdominal pain, nausea and vomiting.  Skin: Negative.  Negative for rash.  Neurological: Negative.  Negative for dizziness and headaches.  All other systems reviewed and are negative.   Vitals:   07/28/23 1445  BP: 108/68  Pulse: 72  Temp: 98.1 F (36.7 C)  SpO2: 98%    Physical Exam Vitals reviewed.  Constitutional:      Appearance: Normal appearance.  HENT:     Head: Normocephalic.  Eyes:     Extraocular Movements: Extraocular movements intact.  Cardiovascular:     Rate and Rhythm: Normal rate.  Pulmonary:     Effort: Pulmonary effort is normal.  Musculoskeletal:     Comments: Feet: Warm to touch.  No erythema or ecchymosis.  No swelling.  No tenderness.  Full range of motion at ankles and toes.  Intact sensation.  No abnormal findings Lower legs: No signs of DVT  Skin:    General: Skin is warm and dry.  Neurological:     Mental Status: She is alert and oriented to person, place, and time.  Psychiatric:         Mood and Affect: Mood normal.        Behavior: Behavior normal.     DG Ankle Complete Right  Result Date: 07/28/2023 CLINICAL DATA:  Right ankle pain and swelling a few days. No injury. EXAM: RIGHT ANKLE - COMPLETE 3+ VIEW COMPARISON:  None Available. FINDINGS: Ankle mortise is normal. No acute fracture or dislocation. No significant soft tissue abnormality. IMPRESSION: Negative. Electronically Signed   By: Elberta Fortis M.D.   On: 07/28/2023 16:43   US Venous Img Lower Bilateral  Result Date: 07/27/2023 CLINICAL DATA:  LEg swelling EXAM: BILATERAL LOWER EXTREMITY VENOUS DOPPLER ULTRASOUND TECHNIQUE: Gray-scale sonography with graded compression, as well as color Doppler and duplex ultrasound were performed to evaluate the lower extremity deep venous systems from the level of the common femoral vein and including the common femoral, femoral, profunda femoral, popliteal and calf veins including the posterior tibial, peroneal and gastrocnemius veins when visible. The superficial great saphenous vein was also interrogated. Spectral Doppler was utilized to evaluate flow at rest and with distal augmentation maneuvers in the common femoral, femoral and popliteal veins. COMPARISON:  CT AP, 01/06/2013. FINDINGS: RIGHT LOWER EXTREMITY VENOUS Normal compressibility of the RIGHT common femoral, superficial femoral, and popliteal veins, as well as the visualized calf veins. Visualized portions of profunda femoral vein and great saphenous vein unremarkable. No filling defects to suggest DVT on grayscale or color Doppler imaging. Doppler waveforms show normal direction of venous flow, normal respiratory plasticity and response to augmentation. OTHER No evidence of superficial thrombophlebitis or abnormal fluid collection. Limitations: none LEFT LOWER EXTREMITY VENOUS Normal compressibility of the LEFT common femoral, superficial femoral, and popliteal veins, as well as the visualized calf veins. Visualized portions of  profunda femoral vein and great saphenous vein unremarkable. No filling defects to suggest DVT on grayscale or color Doppler imaging. Doppler waveforms show normal direction of venous flow, normal respiratory plasticity and response to augmentation. OTHER No evidence  of superficial thrombophlebitis or abnormal fluid collection. Limitations: none IMPRESSION: No evidence of femoropopliteal DVT or superficial thrombophlebitis within either lower extremity. Roanna Banning, MD [facility] Electronically Signed   By: Roanna Banning M.D.   On: 07/27/2023 19:25    ASSESSMENT & PLAN: A total of 32 minutes was spent with the patient and counseling/coordination of care regarding preparing for this visit, review of most recent office visit notes, review of chronic medical conditions under management, review of all medications, differential diagnosis of ankle pain and need for podiatry follow-up, review of x-ray report done today, pain management, prognosis, documentation, and need for follow-up.  Problem List Items Addressed This Visit       Other   Acute right ankle pain - Primary    Benign examination.  No abnormal findings Negative x-ray done today. Negative lower extremity ultrasound done yesterday Unremarkable blood work done yesterday Differential diagnosis discussed with patient Pain management discussed Recommend podiatry evaluation Referral placed today      Relevant Orders   DG Ankle Complete Right (Completed)   Ambulatory referral to Podiatry   Patient Instructions  Ankle Pain The ankle joint helps you stand on your leg and allows you to move around. Ankle pain can happen on either side or the back of the ankle. You may have pain in one ankle or both ankles. Ankle pain may be sharp and burning or dull and aching. There may be tenderness, stiffness, redness, or warmth around the ankle. Many things can cause ankle pain. These include an  injury to the area and overuse of your ankle. Follow these instructions at home: Activity Rest your ankle as told by your health care provider. Avoid doing things that cause ankle pain. Do not use the injured limb to support your body weight until your provider says that you can. Use crutches as told by your provider. Ask your provider when it is safe to drive if you have a brace on your ankle. Do exercises as told by your provider. If you have a removable brace: Wear the brace as told by your provider. Remove it only as told by your provider. Check the skin around the brace every day. Tell your provider about any concerns. Loosen the brace if your toes tingle, become numb, or turn cold and blue. Keep the brace clean. If the brace is not waterproof: Do not let it get wet. Cover it with a watertight covering when you take a bath or shower. If you have an elastic bandage:  Remove it when you take a bath or a shower. Try not to move your ankle much. Wiggle your toes from time to time. This helps to prevent swelling. Adjust the bandage if it feels too tight. Loosen the bandage if your foot tingles, becomes numb, or turns cold and blue. Managing pain, stiffness, and swelling  If told, put ice on the painful area. If you have a removable brace or elastic bandage, remove it as told by your provider. Put ice in a plastic bag. Place a towel between your skin and the bag. Leave the ice on for 20 minutes, 2-3 times a day. If your skin turns bright red, remove the ice right away to prevent skin damage. The risk of damage is higher if you cannot feel pain, heat, or cold. Move your toes often to reduce stiffness and swelling. Raise (elevate) your ankle above the level of your heart while you are sitting or lying down. General  instructions Take over-the-counter and prescription medicines only as told by your provider. To help you and your provider, write down: How often you have ankle pain. Where  the pain is. What the pain feels like. If you are told to wear a certain shoe or insole, make sure you wear it the right way and for as long as you are told. Contact a health care provider if: Your pain gets worse. Your pain does not get better with medicine. You have a fever or chills. You have more trouble walking. You have new symptoms. Your foot, leg, toes, or ankle tingles, becomes numb or swollen, or turns cold and blue. This information is not intended to replace advice given to you by your health care provider. Make sure you discuss any questions you have with your health care provider. Document Revised: 08/14/2022 Document Reviewed: 08/14/2022 Elsevier Patient Education  2024 Elsevier Inc.     Edwina Barth, MD [facility]

## 2023-07-28 NOTE — Assessment & Plan Note (Signed)
Benign examination.  No abnormal findings Negative x-ray done today. Negative lower extremity ultrasound done yesterday Unremarkable blood work done yesterday Differential diagnosis discussed with patient Pain management discussed Recommend podiatry evaluation Referral placed today

## 2023-08-12 ENCOUNTER — Ambulatory Visit (INDEPENDENT_AMBULATORY_CARE_PROVIDER_SITE_OTHER): Payer: Medicare Other | Admitting: Podiatry

## 2023-08-12 ENCOUNTER — Encounter: Payer: Self-pay | Admitting: Podiatry

## 2023-08-12 DIAGNOSIS — M6289 Other specified disorders of muscle: Secondary | ICD-10-CM | POA: Diagnosis not present

## 2023-08-12 DIAGNOSIS — M7751 Other enthesopathy of right foot: Secondary | ICD-10-CM

## 2023-08-12 DIAGNOSIS — I739 Peripheral vascular disease, unspecified: Secondary | ICD-10-CM | POA: Diagnosis not present

## 2023-08-12 NOTE — Progress Notes (Signed)
Subjective:  Patient ID: Jocelyn Parker, female    DOB: Aug 13, 1951,  MRN: [id number]  Chief Complaint  Patient presents with   Ankle Pain    Lateral ankle right - aching x few months, checked same area in 2022, did get better, but now pain has returned, noticed tightness in both lower legs, tried elevating-no help, has also been checked by hospital and PCP-notes in EPIC-has had xrays and ultrasound to check for DVT   New Patient (Initial Visit)    Est pt 2022    Discussed the use of AI scribe software for clinical note transcription with the patient, who gave verbal consent to proceed.  History of Present Illness          72 year old female presents to the office with above concerns.  She states that she has been having ongoing right ankle pain and swelling. The patient reports that the pain is constant and is exacerbated by movement. The patient has had x-rays and an ultrasound to rule out a blood clot, both of which were normal. The patient also reports a sensation of tightness in the legs, particularly at night, and some numbness in the feet. The patient denies any recent injuries or trauma to the ankle. The patient has been seen by a neurologist for this issue in the past. The patient is due to go on a cruise and is keen to resolve the issue before then.   Objective:    Physical Exam         General: AAO x3, NAD  Dermatological: Skin is warm, dry and supple bilateral.  There are no open sores, no preulcerative lesions, no rash or signs of infection present.  Vascular: Dorsalis Pedis artery and Posterior Tibial artery pedal pulses are 2/4 bilateral with immedate capillary fill time.  There is no pain with calf compression, swelling, warmth, erythema.   Neruologic: Grossly intact via light touch bilateral.  Musculoskeletal: Tenderness palpation along the lateral aspect the following area the sinus tarsi and localized edema to this area.  There is no erythema or warmth.  No other  areas of pinpoint tenderness identified this time.  No crepitation with ankle or subtalar joint range of motion.  Describing tightness in her legs.  The calves are supple.  MMT 5/5.  Gait: Unassisted, Nonantalgic.    No images are attached to the encounter.        Assessment:   Capsulitis right ankle, leg pain  Plan:  Patient was evaluated and treated and all questions answered.  Assessment and Plan           Right Ankle Pain and Swelling Chronic issue with recent exacerbation. No recent injury. Pain localized to subtalar joint with some tenderness along peroneal tendon. X-rays and ultrasound for DVT were negative. -Administered steroid injection into subtalar joint today.  Skin was cleaned with Betadine, alcohol.  A mixture of 1 cc Kenalog 10, 0.5 cc of Marcaine plain, 0.5 cc lidocaine plain was infiltrated into the sinus tarsi without complications.  Postinjection care discussed. -Provided ankle brace to be worn for one week. -Order ABI to assess arterial circulation.  Lower Extremity Tightness Reports of tightness in both legs, more pronounced in the evenings. No clear etiology at this time. -Provided worksheet with stretching exercises to alleviate muscle tightness. -If no improvement, consider evaluation for possible nerve issue. -Will check ABI although clinically I do think she has got adequate circulation.  Follow-up in 6 weeks or sooner if symptoms worsen or do not  improve.   Vivi Barrack DPM

## 2023-08-12 NOTE — Patient Instructions (Addendum)
For instructions on how to put on your Tri-Lock Ankle Brace, please visit [email or web address]  --  While at your visit today you received a steroid injection in your foot or ankle to help with your pain. Along with having the steroid medication there is some "numbing" medication in the shot that you received. Due to this you may notice some numbness to the area for the next couple of hours.   I would recommend limiting activity for the next few days to help the steroid injection take affect.    The actually benefit from the steroid injection may take up to 2-7 days to see a difference. You may actually experience a small (as in 10%) INCREASE in pain in the first 24 hours---that is common. It would be best if you can ice the area today and take anti-inflammatory medications (such as Ibuprofen, Motrin, or Aleve) if you are able to take these medications. If you were prescribed another medication to help with the pain go ahead and start that medication today    Things to watch out for that you should contact us or a health care provider urgently would include: 1. Unusual (as in more than 10%) increase in pain 2. New fever > 101.5 3. New swelling or redness of the injected area.  4. Streaking of red lines around the area injected.  If you have any questions or concerns about this, please give our office a call at [phone].   ---  Achilles Tendinitis  with Rehab Achilles tendinitis is a disorder of the Achilles tendon. The Achilles tendon connects the large calf muscles (Gastrocnemius and Soleus) to the heel bone (calcaneus). This tendon is sometimes called the heel cord. It is important for pushing-off and standing on your toes and is important for walking, running, or jumping. Tendinitis is often caused by overuse and repetitive microtrauma. SYMPTOMS Pain, tenderness, swelling, warmth, and redness may occur over the Achilles tendon even at rest. Pain with pushing off, or flexing or  extending the ankle. Pain that is worsened after or during activity. CAUSES  Overuse sometimes seen with rapid increase in exercise programs or in sports requiring running and jumping. Poor physical conditioning (strength and flexibility or endurance). Running sports, especially training running down hills. Inadequate warm-up before practice or play or failure to stretch before participation. Injury to the tendon. PREVENTION  Warm up and stretch before practice or competition. Allow time for adequate rest and recovery between practices and competition. Keep up conditioning. Keep up ankle and leg flexibility. Improve or keep muscle strength and endurance. Improve cardiovascular fitness. Use proper technique. Use proper equipment (shoes, skates). To help prevent recurrence, taping, protective strapping, or an adhesive bandage may be recommended for several weeks after healing is complete. PROGNOSIS  Recovery may take weeks to several months to heal. Longer recovery is expected if symptoms have been prolonged. Recovery is usually quicker if the inflammation is due to a direct blow as compared with overuse or sudden strain. RELATED COMPLICATIONS  Healing time will be prolonged if the condition is not correctly treated. The injury must be given plenty of time to heal. Symptoms can reoccur if activity is resumed too soon. Untreated, tendinitis may increase the risk of tendon rupture requiring additional time for recovery and possibly surgery. TREATMENT  The first treatment consists of rest anti-inflammatory medication, and ice to relieve the pain. Stretching and strengthening exercises after resolution of pain will likely help reduce the risk of recurrence. Referral to a physical therapist or  athletic trainer for further evaluation and treatment may be helpful. A walking boot or cast may be recommended to rest the Achilles tendon. This can help break the cycle of inflammation and  microtrauma. Arch supports (orthotics) may be prescribed or recommended by your caregiver as an adjunct to therapy and rest. Surgery to remove the inflamed tendon lining or degenerated tendon tissue is rarely necessary and has shown less than predictable results. MEDICATION  Nonsteroidal anti-inflammatory medications, such as aspirin and ibuprofen, may be used for pain and inflammation relief. Do not take within 7 days before surgery. Take these as directed by your caregiver. Contact your caregiver immediately if any bleeding, stomach upset, or signs of allergic reaction occur. Other minor pain relievers, such as acetaminophen, may also be used. Pain relievers may be prescribed as necessary by your caregiver. Do not take prescription pain medication for longer than 4 to 7 days. Use only as directed and only as much as you need. Cortisone injections are rarely indicated. Cortisone injections may weaken tendons and predispose to rupture. It is better to give the condition more time to heal than to use them. HEAT AND COLD Cold is used to relieve pain and reduce inflammation for acute and chronic Achilles tendinitis. Cold should be applied for 10 to 15 minutes every 2 to 3 hours for inflammation and pain and immediately after any activity that aggravates your symptoms. Use ice packs or an ice massage. Heat may be used before performing stretching and strengthening activities prescribed by your caregiver. Use a heat pack or a warm soak. SEEK MEDICAL CARE IF: Symptoms get worse or do not improve in 2 weeks despite treatment. New, unexplained symptoms develop. Drugs used in treatment may produce side effects.  EXERCISES:  RANGE OF MOTION (ROM) AND STRETCHING EXERCISES - Achilles Tendinitis  These exercises may help you when beginning to rehabilitate your injury. Your symptoms may resolve with or without further involvement from your physician, physical therapist or athletic trainer. While completing these  exercises, remember:  Restoring tissue flexibility helps normal motion to return to the joints. This allows healthier, less painful movement and activity. An effective stretch should be held for at least 30 seconds. A stretch should never be painful. You should only feel a gentle lengthening or release in the stretched tissue.  STRETCH  Gastroc, Standing  Place hands on wall. Extend right / left leg, keeping the front knee somewhat bent. Slightly point your toes inward on your back foot. Keeping your right / left heel on the floor and your knee straight, shift your weight toward the wall, not allowing your back to arch. You should feel a gentle stretch in the right / left calf. Hold this position for 10 seconds. Repeat 3 times. Complete this stretch 2 times per day.  STRETCH  Soleus, Standing  Place hands on wall. Extend right / left leg, keeping the other knee somewhat bent. Slightly point your toes inward on your back foot. Keep your right / left heel on the floor, bend your back knee, and slightly shift your weight over the back leg so that you feel a gentle stretch deep in your back calf. Hold this position for 10 seconds. Repeat 3 times. Complete this stretch 2 times per day.  STRETCH  Gastrocsoleus, Standing  Note: This exercise can place a lot of stress on your foot and ankle. Please complete this exercise only if specifically instructed by your caregiver.  Place the ball of your right / left foot on  a step, keeping your other foot firmly on the same step. Hold on to the wall or a rail for balance. Slowly lift your other foot, allowing your body weight to press your heel down over the edge of the step. You should feel a stretch in your right / left calf. Hold this position for 10 seconds. Repeat this exercise with a slight bend in your knee. Repeat 3 times. Complete this stretch 2 times per day.   STRENGTHENING EXERCISES - Achilles Tendinitis These exercises may help you when  beginning to rehabilitate your injury. They may resolve your symptoms with or without further involvement from your physician, physical therapist or athletic trainer. While completing these exercises, remember:  Muscles can gain both the endurance and the strength needed for everyday activities through controlled exercises. Complete these exercises as instructed by your physician, physical therapist or athletic trainer. Progress the resistance and repetitions only as guided. You may experience muscle soreness or fatigue, but the pain or discomfort you are trying to eliminate should never worsen during these exercises. If this pain does worsen, stop and make certain you are following the directions exactly. If the pain is still present after adjustments, discontinue the exercise until you can discuss the trouble with your clinician.  STRENGTH - Plantar-flexors  Sit with your right / left leg extended. Holding onto both ends of a rubber exercise band/tubing, loop it around the ball of your foot. Keep a slight tension in the band. Slowly push your toes away from you, pointing them downward. Hold this position for 10 seconds. Return slowly, controlling the tension in the band/tubing. Repeat 3 times. Complete this exercise 2 times per day.   STRENGTH - Plantar-flexors  Stand with your feet shoulder width apart. Steady yourself with a wall or table using as little support as needed. Keeping your weight evenly spread over the width of your feet, rise up on your toes.* Hold this position for 10 seconds. Repeat 3 times. Complete this exercise 2 times per day.  *If this is too easy, shift your weight toward your right / left leg until you feel challenged. Ultimately, you may be asked to do this exercise with your right / left foot only.  STRENGTH  Plantar-flexors, Eccentric  Note: This exercise can place a lot of stress on your foot and ankle. Please complete this exercise only if specifically instructed by  your caregiver.  Place the balls of your feet on a step. With your hands, use only enough support from a wall or rail to keep your balance. Keep your knees straight and rise up on your toes. Slowly shift your weight entirely to your right / left toes and pick up your opposite foot. Gently and with controlled movement, lower your weight through your right / left foot so that your heel drops below the level of the step. You will feel a slight stretch in the back of your calf at the end position. Use the healthy leg to help rise up onto the balls of both feet, then lower weight only on the right / left leg again. Build up to 15 repetitions. Then progress to 3 consecutive sets of 15 repetitions.* After completing the above exercise, complete the same exercise with a slight knee bend (about 30 degrees). Again, build up to 15 repetitions. Then progress to 3 consecutive sets of 15 repetitions.* Perform this exercise 2 times per day.  *When you easily complete 3 sets of 15, your physician, physical therapist or athletic trainer  may advise you to add resistance by wearing a backpack filled with additional weight.  STRENGTH - Plantar Flexors, Seated  Sit on a chair that allows your feet to rest flat on the ground. If necessary, sit at the edge of the chair. Keeping your toes firmly on the ground, lift your right / left heel as far as you can without increasing any discomfort in your ankle. Repeat 3 times. Complete this exercise 2 times a day.

## 2023-08-14 ENCOUNTER — Ambulatory Visit (INDEPENDENT_AMBULATORY_CARE_PROVIDER_SITE_OTHER): Payer: Medicare Other

## 2023-08-14 ENCOUNTER — Ambulatory Visit (HOSPITAL_COMMUNITY)
Admission: RE | Admit: 2023-08-14 | Discharge: 2023-08-14 | Disposition: A | Discharge status: Home / Self Care | Payer: Medicare Other | Source: Ambulatory Visit | Attending: Podiatry | Admitting: Podiatry

## 2023-08-14 VITALS — Ht 63.0 in | Wt 115.0 lb

## 2023-08-14 DIAGNOSIS — Z0000 Encounter for general adult medical examination without abnormal findings: Secondary | ICD-10-CM | POA: Diagnosis not present

## 2023-08-14 DIAGNOSIS — I739 Peripheral vascular disease, unspecified: Secondary | ICD-10-CM | POA: Insufficient documentation

## 2023-08-14 NOTE — Progress Notes (Signed)
Subjective:   Jocelyn Parker is a 72 y.o. female who presents for Medicare Annual (Subsequent) preventive examination.  Visit Complete: Virtual I connected with  Teodora Medici on 08/14/23 by a audio enabled telemedicine application and verified that I am speaking with the correct person using two identifiers.  Patient Location: Home  Provider Location: Office/Clinic  I discussed the limitations of evaluation and management by telemedicine. The patient expressed understanding and agreed to proceed.  Vital Signs: Because this visit was a virtual/telehealth visit, some criteria may be missing or patient reported. Any vitals not documented were not able to be obtained and vitals that have been documented are patient reported.  Patient Medicare AWV questionnaire was completed by the patient on 08/12/2023; I have confirmed that all information answered by patient is correct and no changes since this date.  Cardiac Risk Factors include: advanced age (>72men, >27 women);sedentary lifestyle;family history of premature cardiovascular disease     Objective:    Today's Vitals   08/14/23 1614  Weight: 115 lb (52.2 kg)  Height: 5\' 3"  (1.6 m)  PainSc: 5   PainLoc: Ankle   Body mass index is 20.37 kg/m.     08/14/2023    4:15 PM 01/07/2022    9:51 AM 08/02/2021    9:57 AM 07/31/2020    9:08 AM  Advanced Directives  Does Patient Have a Medical Advance Directive? Yes No Yes Yes  Type of Estate agent of [place];Living will  Healthcare Power of [employer] of [place];Living will  Copy of Healthcare Power of Attorney in Chart? No - copy requested  No - copy requested No - copy requested    Current Medications (verified) Outpatient Encounter Medications as of 08/14/2023  Medication Sig   calcium carbonate (TUMS - DOSED IN MG ELEMENTAL CALCIUM) 500 MG chewable tablet 1 tablet   cyanocobalamin (VITAMIN B12) 1000 MCG/ML injection Inject 1 mL (1,000 mcg total)  into the muscle every 21 ( twenty-one) days.   estradiol (VIVELLE-DOT) 0.1 MG/24HR Place 1 patch (0.1 mg total) onto the skin 2 (two) times a week.   fluticasone (FLONASE) 50 MCG/ACT nasal spray Place 2 sprays into both nostrils as needed. SHAKE LIQUID AND USE 2 SPRAYS IN EACH NOSTRIL DAILY   ibuprofen (ADVIL) 800 MG tablet Take 1 tablet (800 mg total) by mouth every 8 (eight) hours as needed. ibuprofen 800 mg tablet   levothyroxine (SYNTHROID) 50 MCG tablet Take 50 mcg by mouth daily before breakfast.   levothyroxine (SYNTHROID, LEVOTHROID) 75 MCG tablet Take 75 mcg by mouth daily.   meclizine (ANTIVERT) 25 MG tablet Take 1 tablet (25 mg total) by mouth 3 (three) times daily as needed for dizziness. Take 1-2 tablets as needed every 8 hours for dizziness.   Syringe/Needle, Disp, (SYRINGE 3CC/25GX1") 25G X 1" 3 ML MISC One injection monthly   UNABLE TO FIND Med Name: CBD Cream   Vitamin D, Ergocalciferol, (DRISDOL) 1.25 MG (50000 UNIT) CAPS capsule TAKE 1 CAPSULE BY MOUTH EVERY 30 DAYS   No facility-administered encounter medications on file as of 08/14/2023.    Allergies (verified) Sulfa antibiotics, Montelukast sodium, and Vitamin d (calciferol)   History: Past Medical History:  Diagnosis Date   Allergy    sulfa   Chronic interstitial cystitis    Lung cancer (HCC)    per patient    Thyroid disease    Past Surgical History:  Procedure Laterality Date   ABDOMINAL HYSTERECTOMY     lumps removed from breast  1970's ,1990's and in 2000's  nono cancer    LUNG SURGERY  04/05/2020   Family History  Problem Relation Age of Onset   Renal cancer Mother    Heart attack Father    Stroke Maternal Grandmother    Heart attack Paternal Grandfather    Multiple sclerosis Sister    Cancer Brother    Social History   Socioeconomic History   Marital status: Married    Spouse name: Not on file   Number of children: Not on file   Years of education: Not on file   Highest education  level: Not on file  Occupational History   Occupation: Retired  Tobacco Use   Smoking status: Never    Passive exposure: Never   Smokeless tobacco: Never  Vaping Use   Vaping status: Never Used  Substance and Sexual Activity   Alcohol use: No   Drug use: No   Sexual activity: Yes    Birth control/protection: Surgical    Comment: hyst  Other Topics Concern   Not on file  Social History Narrative   Not on file   Social Determinants of Health   Financial Resource Strain: Low Risk  (08/14/2023)   Overall Financial Resource Strain (CARDIA)    Difficulty of Paying Living Expenses: Not hard at all  Food Insecurity: No Food Insecurity (08/14/2023)   Hunger Vital Sign    Worried About Running Out of Food in the Last Year: Never true    Ran Out of Food in the Last Year: Never true  Transportation Needs: No Transportation Needs (08/14/2023)   PRAPARE - Administrator, Civil Service (Medical): No    Lack of Transportation (Non-Medical): No  Physical Activity: Insufficiently Active (08/14/2023)   Exercise Vital Sign    Days of Exercise per Week: 1 day    Minutes of Exercise per Session: 60 min  Stress: No Stress Concern Present (08/14/2023)   [employer] of Occupational Health - Occupational Stress Questionnaire    Feeling of Stress : Not at all  Social Connections: Unknown (08/14/2023)   Social Connection and Isolation Panel [NHANES]    Frequency of Communication with Friends and Family: More than three times a week    Frequency of Social Gatherings with Friends and Family: Once a week    Attends Religious Services: Not on Marketing executive or Organizations: Yes    Attends Engineer, structural: More than 4 times per year    Marital Status: Married    Tobacco Counseling Counseling given: Not Answered   Clinical Intake:  Pre-visit preparation completed: Yes  Pain : 0-10 Pain Score: 5  Pain Type: Chronic pain Pain Location:  Ankle     BMI - recorded: 20.37 Nutritional Status: BMI of 19-24  Normal Nutritional Risks: None Diabetes: No  How often do you need to have someone help you when you read instructions, pamphlets, or other written materials from your doctor or pharmacy?: 1 - Never What is the last grade level you completed in school?: MASTER'S DEGREE  Interpreter Needed?: No  Information entered by :: Susie Cassette, LPN.   Activities of Daily Living    08/14/2023    4:23 PM 08/12/2023    3:20 PM  In your present state of health, do you have any difficulty performing the following activities:  Hearing? 0 0  Vision? 0 0  Difficulty concentrating or making decisions? 0 0  Walking or climbing stairs? 0 0  Dressing or bathing? 0 0  Doing errands, shopping? 0 0  Preparing Food and eating ? N N  Using the Toilet? N N  In the past six months, have you accidently leaked urine? N N  Do you have problems with loss of bowel control? N N  Managing your Medications? N N  Managing your Finances? N N  Housekeeping or managing your Housekeeping? N N    Patient Care Team: Myrlene Broker, MD as PCP - General (Internal Medicine) Orbie Pyo, MD as PCP - Cardiology (Cardiology) Dorisann Frames, MD as Referring Physician (Endocrinology) Bernette Redbird, MD as Consulting Physician (Gastroenterology) Jamison Neighbor, MD (Urology) Rennis Golden Lisette Abu, MD as Consulting Physician (Cardiology) Janalyn Harder, MD (Inactive) as Consulting Physician (Dermatology)  Indicate any recent Medical Services you may have received from other than [facility] providers in the past year (date may be approximate).     Assessment:   This is a routine wellness examination for Jocelyn Parker.  Hearing/Vision screen Hearing Screening - Comments:: Patient has hearing difficulty; No hearing aids. Vision Screening - Comments:: Patient does wear otc readers.  Eye exam done by: [facility]    Goals Addressed              This Visit's Progress    Client understands the importance of follow-up with providers by attending scheduled visits        Depression Screen    08/14/2023    4:16 PM 07/28/2023    2:45 PM 01/14/2023   11:09 AM 08/02/2021    9:55 AM 08/10/2020    2:14 PM 07/31/2020    8:57 AM 05/19/2020   11:38 AM  PHQ 2/9 Scores  PHQ - 2 Score 0 0 0 0 0 0 0  PHQ- 9 Score 1          Fall Risk    08/14/2023    4:23 PM 08/12/2023    3:20 PM 07/28/2023    2:45 PM 01/14/2023   11:09 AM 08/02/2021    9:59 AM  Fall Risk   Falls in the past year? 0 0 0 0 0  Number falls in past yr: 0 0 0 0 0  Injury with Fall? 0 0 0 0 0  Risk for fall due to : No Fall Risks  History of fall(s) No Fall Risks Impaired vision;Impaired balance/gait  Risk for fall due to: Comment     dizzy spells at times  Follow up Falls prevention discussed  Falls evaluation completed Falls evaluation completed Falls prevention discussed    MEDICARE RISK AT HOME: Medicare Risk at Home Any stairs in or around the home?: Yes If so, are there any without handrails?: No Home free of loose throw rugs in walkways, pet beds, electrical cords, etc?: Yes Adequate lighting in your home to reduce risk of falls?: Yes Life alert?: No Use of a cane, walker or w/c?: No Grab bars in the bathroom?: No Shower chair or bench in shower?: No Elevated toilet seat or a handicapped toilet?: Yes  TIMED UP AND GO:  Was the test performed?  No    Cognitive Function:    08/14/2023    4:22 PM  MMSE - Mini Mental State Exam  Not completed: Unable to complete        08/14/2023    4:18 PM 08/02/2021   10:02 AM 07/31/2020    9:15 AM  6CIT Screen  What Year? 0 points 0 points 0 points  What month? 0 points  0 points 0 points  What time? 0 points 0 points   Count back from 20 0 points 2 points 0 points  Months in reverse 0 points 0 points 0 points  Repeat phrase 0 points 0 points 0 points  Total Score 0 points 2 points      Immunizations Immunization History  Administered Date(s) Administered   Influenza-Unspecified 12/28/2001   PFIZER(Purple Top)SARS-COV-2 Vaccination 12/03/2019, 12/31/2019, 07/05/2020, 08/04/2020, 02/09/2021, 07/21/2021   PNEUMOCOCCAL CONJUGATE-20 01/16/2022   PPD Test 11/27/2015   Pfizer Covid-19 Vaccine Bivalent Booster 65yrs & up 07/17/2021   Pfizer(Comirnaty)Fall Seasonal Vaccine 12 years and older 08/28/2022   Tdap 07/22/2008    TDAP status: Due, Education has been provided regarding the importance of this vaccine. Advised may receive this vaccine at local pharmacy or Health Dept. Aware to provide a copy of the vaccination record if obtained from local pharmacy or Health Dept. Verbalized acceptance and understanding.  Flu Vaccine status: Declined, Education has been provided regarding the importance of this vaccine but patient still declined. Advised may receive this vaccine at local pharmacy or Health Dept. Aware to provide a copy of the vaccination record if obtained from local pharmacy or Health Dept. Verbalized acceptance and understanding.  Pneumococcal vaccine status: Up to date  Covid-19 vaccine status: Completed vaccines  Qualifies for Shingles Vaccine? Yes   Zostavax completed No   Shingrix Completed?: No.    Education has been provided regarding the importance of this vaccine. Patient has been advised to call insurance company to determine out of pocket expense if they have not yet received this vaccine. Advised may also receive vaccine at local pharmacy or Health Dept. Verbalized acceptance and understanding.  Screening Tests Health Maintenance  Topic Date Due   Zoster Vaccines- Shingrix (1 of 2) Never done   DTaP/Tdap/Td (2 - Td or Tdap) 07/22/2018   COVID-19 Vaccine (8 - 2023-24 season) 07/06/2023   MAMMOGRAM  11/13/2023 (Originally 12/05/2021)   Medicare Annual Wellness (AWV)  08/13/2024   Colonoscopy  12/26/2027   Pneumonia Vaccine 23+ Years old  Completed    DEXA SCAN  Completed   Hepatitis C Screening  Completed   HPV VACCINES  Aged Out   INFLUENZA VACCINE  Discontinued    Health Maintenance  Health Maintenance Due  Topic Date Due   Zoster Vaccines- Shingrix (1 of 2) Never done   DTaP/Tdap/Td (2 - Td or Tdap) 07/22/2018   COVID-19 Vaccine (8 - 2023-24 season) 07/06/2023    Colorectal cancer screening: Type of screening: Colonoscopy. Completed 12/25/2020. Repeat every 7 years  Mammogram status: Completed 08/2023. Repeat every year at [facility].  Patient will have records faxed.  Bone Density status: Completed 07/14/2020. Results reflect: Bone density results: OSTEOPENIA. Repeat every 2-3 years.  Lung Cancer Screening: (Low Dose CT Chest recommended if Age 48-80 years, 20 pack-year currently smoking OR have quit w/in 15years.) does not qualify.   Lung Cancer Screening Referral: no  Additional Screening:  Hepatitis C Screening: does qualify; Completed 04/05/2019  Vision Screening: Recommended annual ophthalmology exams for early detection of glaucoma and other disorders of the eye. Is the patient up to date with their annual eye exam?  Yes  Who is the provider or what is the name of the office in which the patient attends annual eye exams? [facility] If pt is not established with a provider, would they like to be referred to a provider to establish care? No .   Dental Screening: Recommended annual dental exams for  proper oral hygiene  Diabetic Foot Exam: N/A  Community Resource Referral / Chronic Care Management: CRR required this visit?  No   CCM required this visit?  No     Plan:     I have personally reviewed and noted the following in the patient's chart:   Medical and social history Use of alcohol, tobacco or illicit drugs  Current medications and supplements including opioid prescriptions. Patient is not currently taking opioid prescriptions. Functional ability and status Nutritional status Physical  activity Advanced directives List of other physicians Hospitalizations, surgeries, and ER visits in previous 12 months Vitals Screenings to include cognitive, depression, and falls Referrals and appointments  In addition, I have reviewed and discussed with patient certain preventive protocols, quality metrics, and best practice recommendations. A written personalized care plan for preventive services as well as general preventive health recommendations were provided to patient.     Mickeal Needy, LPN   16/08/[id number]   After Visit Summary: (MyChart) Due to this being a telephonic visit, the after visit summary with patients personalized plan was offered to patient via MyChart   Nurse Notes: Normal cognitive status assessed by direct observation via telephone conversation by this Nurse Health Advisor. No abnormalities found.

## 2023-08-14 NOTE — Patient Instructions (Signed)
Jocelyn Parker , Thank you for taking time to come for your Medicare Wellness Visit. I appreciate your ongoing commitment to your health goals. Please review the following plan we discussed and let me know if I can assist you in the future.   Referrals/Orders/Follow-Ups/Clinician Recommendations: No  This is a list of the screening recommended for you and due dates:  Health Maintenance  Topic Date Due   Zoster (Shingles) Vaccine (1 of 2) Never done   DTaP/Tdap/Td vaccine (2 - Td or Tdap) 07/22/2018   COVID-19 Vaccine (8 - 2023-24 season) 07/06/2023   Mammogram  11/13/2023*   Medicare Annual Wellness Visit  08/13/2024   Colon Cancer Screening  12/26/2027   Pneumonia Vaccine  Completed   DEXA scan (bone density measurement)  Completed   Hepatitis C Screening  Completed   HPV Vaccine  Aged Out   Flu Shot  Discontinued  *Topic was postponed. The date shown is not the original due date.    Advanced directives: (Copy Requested) Please bring a copy of your health care power of attorney and living will to the office to be added to your chart at your convenience.  Next Medicare Annual Wellness Visit scheduled for next year: Yes

## 2023-08-18 ENCOUNTER — Other Ambulatory Visit: Payer: Self-pay | Admitting: Podiatry

## 2023-08-18 ENCOUNTER — Telehealth: Payer: Self-pay | Admitting: Podiatry

## 2023-08-18 LAB — VAS US ABI WITH/WO TBI
Left ABI: 1.16
Right ABI: 1.18

## 2023-08-18 MED ORDER — GABAPENTIN 100 MG PO CAPS: 100.0000 mg | ORAL_CAPSULE | Freq: Every day | ORAL | 0 refills | Status: DC

## 2023-08-18 NOTE — Telephone Encounter (Signed)
Pt states she is having numbness in both ankles she's not sure what to do

## 2023-08-22 ENCOUNTER — Other Ambulatory Visit: Payer: Self-pay | Admitting: Internal Medicine

## 2023-08-22 DIAGNOSIS — J302 Other seasonal allergic rhinitis: Secondary | ICD-10-CM

## 2023-09-15 ENCOUNTER — Encounter: Payer: Self-pay | Admitting: Internal Medicine

## 2023-09-15 ENCOUNTER — Ambulatory Visit (INDEPENDENT_AMBULATORY_CARE_PROVIDER_SITE_OTHER): Payer: Medicare Other | Admitting: Internal Medicine

## 2023-09-15 VITALS — BP 116/74 | HR 72 | Temp 98.6°F | Ht 63.0 in | Wt 116.0 lb

## 2023-09-15 DIAGNOSIS — J019 Acute sinusitis, unspecified: Secondary | ICD-10-CM

## 2023-09-15 MED ORDER — AMOXICILLIN-POT CLAVULANATE 875-125 MG PO TABS: 1.0000 | ORAL_TABLET | Freq: Two times a day (BID) | ORAL | 0 refills | Status: DC

## 2023-09-15 NOTE — Progress Notes (Signed)
Subjective:    Patient ID: Jocelyn Parker, female    DOB: 19-Jan-1951, 72 y.o.   MRN: [id number]      HPI Jocelyn Parker is here for  Chief Complaint  Patient presents with   Nasal Congestion    A lot of mucus, lot of fatigue, no chills,no sore throat last week; no appetite    Got sick on the cruise ship, then had to fly x 2.    Symptoms started a little over one week.   Having a lot of mucus - initially it was clear, sticky.  It has become slightly discolored.  All of her mucus is nasal congestion.  She did have a little cough for a little while from postnasal drainage.  She states decreased appetite, fever up to 101, fatigue, sore throat which has resolved, lightheadedness and dizziness.  She is concerned because she is not feeling better.  Typically she does not get sick  Taking mucinex D, ibuprofen   Medications and allergies reviewed with patient and updated if appropriate.  Current Outpatient Medications on File Prior to Visit  Medication Sig Dispense Refill   calcium carbonate (TUMS - DOSED IN MG ELEMENTAL CALCIUM) 500 MG chewable tablet 1 tablet     cyanocobalamin (VITAMIN B12) 1000 MCG/ML injection Inject 1 mL (1,000 mcg total) into the muscle every 21 ( twenty-one) days. 10 mL 3   estradiol (VIVELLE-DOT) 0.1 MG/24HR Place 1 patch (0.1 mg total) onto the skin 2 (two) times a week. 8 patch 12   fluticasone (FLONASE) 50 MCG/ACT nasal spray PLACE 2 SPRAYS IN EACH NOSTRIL DAILY 48 g 3   gabapentin (NEURONTIN) 100 MG capsule Take 1 capsule (100 mg total) by mouth at bedtime. 90 capsule 0   ibuprofen (ADVIL) 800 MG tablet Take 1 tablet (800 mg total) by mouth every 8 (eight) hours as needed. ibuprofen 800 mg tablet 30 tablet 1   levothyroxine (SYNTHROID) 50 MCG tablet Take 50 mcg by mouth daily before breakfast.     levothyroxine (SYNTHROID, LEVOTHROID) 75 MCG tablet Take 75 mcg by mouth daily.     meclizine (ANTIVERT) 25 MG tablet Take 1 tablet (25 mg total) by mouth 3 (three)  times daily as needed for dizziness. Take 1-2 tablets as needed every 8 hours for dizziness. 30 tablet 0   Syringe/Needle, Disp, (SYRINGE 3CC/25GX1") 25G X 1" 3 ML MISC One injection monthly 12 each 11   UNABLE TO FIND Med Name: CBD Cream     Vitamin D, Ergocalciferol, (DRISDOL) 1.25 MG (50000 UNIT) CAPS capsule TAKE 1 CAPSULE BY MOUTH EVERY 30 DAYS 6 capsule 1   No current facility-administered medications on file prior to visit.    Review of Systems  Constitutional:  Positive for appetite change (decreased), fatigue and fever (last weekend, Tm 101).  HENT:  Positive for congestion, postnasal drip and sore throat (resolved). Negative for ear pain, sinus pressure and sinus pain.   Eyes:        Eyes burn  Respiratory:  Positive for cough (from PND). Negative for shortness of breath and wheezing.   Gastrointestinal:  Positive for constipation. Negative for diarrhea and nausea.  Musculoskeletal:  Positive for myalgias (related to fever).  Neurological:  Positive for dizziness and light-headedness. Negative for headaches.       Objective:   Vitals:   09/15/23 1447  BP: 116/74  Pulse: 72  Temp: 98.6 F (37 C)  SpO2: 98%   BP Readings from Last 3 Encounters:  09/15/23 116/74  07/28/23 108/68  07/27/23 130/68   Wt Readings from Last 3 Encounters:  09/15/23 118 lb (53.5 kg)  08/14/23 115 lb (52.2 kg)  07/28/23 118 lb 4 oz (53.6 kg)   Body mass index is 20.9 kg/m.    Physical Exam Constitutional:      General: She is not in acute distress.    Appearance: Normal appearance. She is not ill-appearing.  HENT:     Head: Normocephalic and atraumatic.     Right Ear: Tympanic membrane, ear canal and external ear normal.     Left Ear: Tympanic membrane, ear canal and external ear normal.     Mouth/Throat:     Mouth: Mucous membranes are moist.     Pharynx: No oropharyngeal exudate or posterior oropharyngeal erythema.  Eyes:     Conjunctiva/sclera: Conjunctivae normal.   Cardiovascular:     Rate and Rhythm: Normal rate and regular rhythm.  Pulmonary:     Effort: Pulmonary effort is normal. No respiratory distress.     Breath sounds: Normal breath sounds. No wheezing or rales.  Musculoskeletal:     Cervical back: Neck supple. No tenderness.  Lymphadenopathy:     Cervical: No cervical adenopathy.  Skin:    General: Skin is warm and dry.  Neurological:     Mental Status: She is alert.            Assessment & Plan:    Acute sinus infection: Acute Likely bacterial  Start Augmentin 875-125 mg BID x 10 day otc cold medications Rest, fluid Call if no improvement

## 2023-09-15 NOTE — Patient Instructions (Addendum)
      Medications changes include :   Augmentin twice daily        Return if symptoms worsen or fail to improve.

## 2023-09-16 ENCOUNTER — Telehealth: Payer: Self-pay | Admitting: Internal Medicine

## 2023-09-16 MED ORDER — FLUCONAZOLE 150 MG PO TABS: ORAL_TABLET | ORAL | 0 refills | Status: AC

## 2023-09-16 NOTE — Telephone Encounter (Signed)
Patient was seen by Dr. Lawerance Bach yesterday and was prescribed  amoxicillin-clavulanate (AUGMENTIN) 875-125 MG tablet. She said she has had issues taking this medication in the past. She would like to speak with someone to find out if it is best if she continues taking it or if something else needs to be prescribed. Patient was adamant that she needs a call back today. Best callback is [phone].

## 2023-09-16 NOTE — Telephone Encounter (Signed)
Spoke with patient today. 

## 2023-09-16 NOTE — Addendum Note (Signed)
Addended by: Pincus Sanes on: 09/16/2023 04:43 PM   Modules accepted: Orders

## 2023-09-16 NOTE — Telephone Encounter (Signed)
Pt called back very concerned about the situation.

## 2023-09-16 NOTE — Telephone Encounter (Signed)
If she develops diarrhea we can change it or if that is what she had in the past we can change it now.  I will send in some diflucan now.

## 2023-09-19 ENCOUNTER — Telehealth: Payer: Self-pay | Admitting: Internal Medicine

## 2023-09-19 NOTE — Telephone Encounter (Signed)
Pt called stating that she is still having ear pain since her last visit and it wouldn't go away.  Pt wants to know if she can be prescribed something else, do she need another appt or do you have any recommendations for something over the counter for this issue? Last visit was: 11.11.2024  PLEASE ADVISE,  Thanks

## 2023-09-19 NOTE — Telephone Encounter (Signed)
Have her use the flonase daily.   She should continue the augmentin for now - I would not recommend changing it.  She can take otc cold meds depending on her other symptoms.  If she is not feeling better on Monday it may be a good idea for her to come in for re-eval.

## 2023-09-19 NOTE — Telephone Encounter (Signed)
Spoke with patient and there were no more concerns she verbalized she understood and if not better to come in and bee seen

## 2023-09-19 NOTE — Telephone Encounter (Signed)
Please advise for patient

## 2023-09-22 ENCOUNTER — Encounter: Payer: Self-pay | Admitting: Internal Medicine

## 2023-09-23 ENCOUNTER — Encounter: Payer: Self-pay | Admitting: Internal Medicine

## 2023-09-23 ENCOUNTER — Ambulatory Visit (INDEPENDENT_AMBULATORY_CARE_PROVIDER_SITE_OTHER): Payer: Medicare Other | Admitting: Internal Medicine

## 2023-09-23 VITALS — BP 118/74 | HR 70 | Temp 98.7°F | Ht 63.0 in | Wt 115.0 lb

## 2023-09-23 DIAGNOSIS — J329 Chronic sinusitis, unspecified: Secondary | ICD-10-CM | POA: Insufficient documentation

## 2023-09-23 DIAGNOSIS — J0110 Acute frontal sinusitis, unspecified: Secondary | ICD-10-CM

## 2023-09-23 NOTE — Progress Notes (Signed)
   Subjective:   Patient ID: Jocelyn Parker, female    DOB: Dec 16, 1950, 72 y.o.   MRN: [id number]  HPI The patient is a 72 YO female coming in for ongoing issues. Seen and treated for sinus infection about 8 days ago with 10 day course of antibiotics. She is improving overall. Still fatigued and poor appetite. Some PND as well. No ear pain or fevers or chills.   Review of Systems  Constitutional:  Positive for appetite change and fatigue.  HENT:  Positive for postnasal drip.   Eyes: Negative.   Respiratory:  Negative for cough, chest tightness and shortness of breath.   Cardiovascular:  Negative for chest pain, palpitations and leg swelling.  Gastrointestinal:  Negative for abdominal distention, abdominal pain, constipation, diarrhea, nausea and vomiting.  Musculoskeletal: Negative.   Skin: Negative.   Neurological: Negative.   Psychiatric/Behavioral: Negative.      Objective:  Physical Exam Constitutional:      Appearance: She is well-developed.  HENT:     Head: Normocephalic and atraumatic.     Right Ear: Tympanic membrane normal.     Left Ear: Tympanic membrane normal.     Ears:     Comments: Post nasal drip Cardiovascular:     Rate and Rhythm: Normal rate and regular rhythm.  Pulmonary:     Effort: Pulmonary effort is normal. No respiratory distress.     Breath sounds: Normal breath sounds. No wheezing or rales.  Abdominal:     General: Bowel sounds are normal. There is no distension.     Palpations: Abdomen is soft.     Tenderness: There is no abdominal tenderness. There is no rebound.  Musculoskeletal:     Cervical back: Normal range of motion.  Skin:    General: Skin is warm and dry.  Neurological:     Mental Status: She is alert and oriented to person, place, and time.     Coordination: Coordination normal.     Vitals:   09/23/23 1027  BP: 118/74  Pulse: 70  Temp: 98.7 F (37.1 C)  TempSrc: Oral  SpO2: 98%  Weight: 115 lb (52.2 kg)  Height: 5\' 3"  (1.6  m)    Assessment & Plan:  Visit time 15 minutes in face to face communication with patient and coordination of care, additional 5 minutes spent in record review, coordination or care, ordering tests, communicating/referring to other healthcare professionals, documenting in medical records all on the same day of the visit for total time 20 minutes spent on the visit.

## 2023-09-23 NOTE — Assessment & Plan Note (Signed)
She is improving overall and advised to finish her course of augmentin (2 days left). Advised to start some protein drinks to help while appetite is poor. Increase activity gradually and continue flonase and claritin.

## 2023-10-14 ENCOUNTER — Telehealth: Payer: Self-pay | Admitting: Internal Medicine

## 2023-10-14 NOTE — Telephone Encounter (Signed)
Error

## 2023-10-16 ENCOUNTER — Ambulatory Visit: Payer: Medicare Other | Admitting: Physician Assistant

## 2023-10-16 NOTE — Progress Notes (Signed)
   I, Stevenson Clinch, CMA acting as a scribe for Jocelyn Graham, MD.  Jocelyn Parker is a 72 y.o. female who presents to [facility] at [facility] today for R ankle pain. Pt was previously seen by Dr.Navon Kotowski in 2022 for thoracic and cervical pain.  Today, pt c/o R ankle pain x 3 months. She is a pt of pain management. Pt was seen at ED and by her PCP for this issue in Sept. Pt locates pain to lateral aspect of the ankle. Swelling present. Injury several years ago. Describes pain as aching and numbness, denies instability. Has been seen by podiatry in the past, 08/2023, has intra-articular inj with minimal relief. Wearing brace daily.   R ankle swelling:  Aggravates: unsure Treatments tried: brace, IBU, ice  Dx testing: 07/28/23 R ankle XR  07/27/23 LE bilat vasc US  Pertinent review of systems: No fevers or chills  Relevant historical information: History of lung cancer resected at stage I 2021   Exam:  BP 110/72   Pulse 74   Ht 5\' 3"  (1.6 m)   SpO2 99%   BMI 20.37 kg/m  General: Well Developed, well nourished, and in no acute distress.   MSK: Right ankle no swelling.  Normal-appearing Tender palpation ATFL region.  Normal ankle motion. Stable ligamentous exam. Intact strength.    Lab and Radiology Results  Diagnostic Limited MSK Ultrasound of: Right ankle Degeneration visible at anterior lateral ankle joint.  Mild joint effusion is present. Peroneal tendons are normal-appearing Impression: Anterior lateral ankle DJD  EXAM: RIGHT ANKLE - COMPLETE 3+ VIEW   COMPARISON:  None Available.   FINDINGS: Ankle mortise is normal. No acute fracture or dislocation. No significant soft tissue abnormality.   IMPRESSION: Negative.     Electronically Signed   By: Jocelyn Parker M.D.   On: 07/28/2023 16:43   I, Jocelyn Parker, personally (independently) visualized and performed the interpretation of the images attached in this note.   Assessment and Plan: 72 y.o.  female with chronic right ankle pain ongoing for almost 3 months now.  She has had trials of physician directed home exercise program oral medications for pain like Tylenol and a interarticular steroid injection and ankle brace directed by a podiatrist.  None of this is worked very well.  At this point symptoms ongoing for almost 3 months plan for MRI to further characterize source of pain and to direct future treatment.  Recheck after we get the MRI back.   PDMP not reviewed this encounter. Orders Placed This Encounter  Procedures   [place] LIMITED JOINT SPACE STRUCTURES LOW RIGHT(NO LINKED CHARGES)    Reason for Exam (SYMPTOM  OR DIAGNOSIS REQUIRED):   right ankle pain    Preferred imaging location?:   [facility]   MR ANKLE RIGHT WO CONTRAST    Standing Status:   Future    Expiration Date:   10/16/2024    What is the patient's sedation requirement?:   No Sedation    Does the patient have a pacemaker or implanted devices?:   No    Preferred imaging location?:   [facility] (table limit-350lbs)   No orders of the defined types were placed in this encounter.    Discussed warning signs or symptoms. Please see discharge instructions. Patient expresses understanding.   The above documentation has been reviewed and is accurate and complete Jocelyn Parker, M.D.

## 2023-10-17 ENCOUNTER — Encounter: Payer: Self-pay | Admitting: Family Medicine

## 2023-10-17 ENCOUNTER — Ambulatory Visit (INDEPENDENT_AMBULATORY_CARE_PROVIDER_SITE_OTHER): Payer: Medicare Other | Admitting: Family Medicine

## 2023-10-17 ENCOUNTER — Other Ambulatory Visit: Payer: Self-pay

## 2023-10-17 VITALS — BP 110/72 | HR 74 | Ht 63.0 in

## 2023-10-17 DIAGNOSIS — G8929 Other chronic pain: Secondary | ICD-10-CM | POA: Diagnosis not present

## 2023-10-17 DIAGNOSIS — M25571 Pain in right ankle and joints of right foot: Secondary | ICD-10-CM

## 2023-10-17 NOTE — Patient Instructions (Signed)
Thank you for coming in today.   You should hear from MRI scheduling within 1 week. If you do not hear please let me know.    Check back after we get the MRI results back

## 2023-10-19 ENCOUNTER — Ambulatory Visit (INDEPENDENT_AMBULATORY_CARE_PROVIDER_SITE_OTHER): Payer: Medicare Other

## 2023-10-19 DIAGNOSIS — G8929 Other chronic pain: Secondary | ICD-10-CM | POA: Diagnosis not present

## 2023-10-19 DIAGNOSIS — M25571 Pain in right ankle and joints of right foot: Secondary | ICD-10-CM

## 2023-10-21 ENCOUNTER — Other Ambulatory Visit: Payer: Medicare Other

## 2023-10-23 ENCOUNTER — Other Ambulatory Visit: Payer: Self-pay | Admitting: Endocrinology

## 2023-10-23 DIAGNOSIS — E049 Nontoxic goiter, unspecified: Secondary | ICD-10-CM

## 2023-10-27 ENCOUNTER — Ambulatory Visit
Admission: RE | Admit: 2023-10-27 | Discharge: 2023-10-27 | Disposition: A | Discharge status: Home / Self Care | Payer: Medicare Other | Source: Ambulatory Visit | Attending: Endocrinology | Admitting: Endocrinology

## 2023-10-27 DIAGNOSIS — E049 Nontoxic goiter, unspecified: Secondary | ICD-10-CM

## 2023-10-30 NOTE — Progress Notes (Signed)
 Office Visit Note  Patient: Jocelyn Parker             Date of Birth: Aug 30, 1951           MRN: [id number]             PCP: Rollene Almarie LABOR, MD Referring: Rollene Almarie LABOR, * Visit Date: 11/12/2023 Occupation: @GUAROCC @  Subjective:  Right ankle pain  History of Present Illness: Jocelyn Parker is a 72 y.o. female with positive ANA, osteoarthritis and degenerative disc disease.  She states she had quite a rough year due to increased pain and discomfort.  She had MRI of her cervical and lumbar spine which showed degenerative changes.  She has been going to the neurologist and had been to the pain management in the past.  She uses tizanidine on as needed basis.  She also uses TENS unit on the neck and lower back.  She has been experiencing right ankle joint since September.  Patient recalls that she sprained her right ankle several years ago.  She states she recently started riding bike after that she developed swelling over her right ankle.  She was seen by podiatrist and then by Dr. Janit.  She had MRI of her ankle joint which showed tenosynovitis and osteoarthritis.  She had a cortisone injection to her right ankle but did not notice much improvement.  She denies any discomfort in her knees today.  There is no history of oral ulcers, nasal ulcers, malar rash, photosensitivity,or lymphadenopathy.  She had no recent issues with Raynaud's phenomenon.    Activities of Daily Living:  Patient reports morning stiffness for 0 minute.   Patient Denies nocturnal pain.  Difficulty dressing/grooming: Denies Difficulty climbing stairs: Denies Difficulty getting out of chair: Denies Difficulty using hands for taps, buttons, cutlery, and/or writing: Denies  Review of Systems  Constitutional:  Positive for fatigue.  HENT:  Negative for mouth sores and mouth dryness.   Eyes:  Positive for dryness.  Respiratory:  Negative for shortness of breath.   Cardiovascular:  Negative for chest pain and  palpitations.  Gastrointestinal:  Positive for constipation. Negative for blood in stool and diarrhea.  Endocrine: Negative for increased urination.  Genitourinary:  Negative for involuntary urination.  Musculoskeletal:  Positive for joint pain, joint pain, joint swelling, myalgias and myalgias. Negative for gait problem, muscle weakness, morning stiffness and muscle tenderness.  Skin:  Positive for hair loss and sensitivity to sunlight. Negative for color change and rash.  Allergic/Immunologic: Negative for susceptible to infections.  Neurological:  Positive for dizziness and headaches.  Hematological:  Negative for swollen glands.  Psychiatric/Behavioral:  Positive for sleep disturbance. Negative for depressed mood. The patient is not nervous/anxious.     PMFS History:  Patient Active Problem List   Diagnosis Date Noted   Throat pain 11/04/2023   Acute right ankle pain 07/28/2023   Coronary artery calcification 12/02/2022   Leg pain 11/12/2022   Familial hypercholesterolemia 02/12/2022   Celiac disease 07/17/2021   Impaired fasting glucose 07/17/2021   Vertigo 03/25/2021   Gastritis with intestinal metaplasia of stomach 01/10/2021   S/P partial lobectomy of lung 04/12/2020   Nodule of upper lobe of right lung 04/05/2020   Bilateral breast cysts 12/27/2019   Multiple cysts of breast 12/27/2019   Malignant neoplasm of upper lobe of right lung (HCC) 10/12/2019   Sensorineural hearing loss (SNHL) of both ears 06/20/2019   Adenocarcinoma of lung, stage 1, right (HCC) 05/26/2019   Tinnitus of both  ears 05/12/2019   B12 deficiency 04/11/2019   Cyst of ovary 04/01/2019   Menopause syndrome 04/01/2019   Osteopenia 04/01/2019   Vitamin D  deficiency 05/24/2015   Xanthoma of eyelid 12/21/2014   Cyst of kidney, acquired 12/20/2014   Interstitial cystitis (chronic) without hematuria 03/20/2012   Shortness of breath 08/16/2008   Nontoxic multinodular goiter 08/08/2007    Past Medical  History:  Diagnosis Date   Allergy    sulfa   Chronic interstitial cystitis    Lung cancer (HCC)    per patient    Thyroid  disease     Family History  Problem Relation Age of Onset   Renal cancer Mother    Heart attack Father    Stroke Maternal Grandmother    Heart attack Paternal Grandfather    Multiple sclerosis Sister    Cancer Brother    Past Surgical History:  Procedure Laterality Date   ABDOMINAL HYSTERECTOMY     lumps removed from breast     1970's ,1990's and in 2000's  nono cancer    LUNG SURGERY  04/05/2020   Social History   Social History Narrative   Not on file   Immunization History  Administered Date(s) Administered   Influenza-Unspecified 12/28/2001   PFIZER(Purple Top)SARS-COV-2 Vaccination 12/03/2019, 12/31/2019, 07/05/2020, 08/04/2020, 02/09/2021, 07/21/2021   PNEUMOCOCCAL CONJUGATE-20 01/16/2022   PPD Test 11/27/2015   Pfizer Covid-19 Vaccine Bivalent Booster 4yrs & up 07/17/2021   Pfizer(Comirnaty)Fall Seasonal Vaccine 12 years and older 08/28/2022   Tdap 07/22/2008     Objective: Vital Signs: BP 111/71 (BP Location: Left Arm, Patient Position: Sitting, Cuff Size: Normal)   Pulse 83   Resp 14   Ht 5' 3.5 (1.613 m)   Wt 119 lb (54 kg)   BMI 20.75 kg/m    Physical Exam Vitals and nursing note reviewed.  Constitutional:      Appearance: She is well-developed.  HENT:     Head: Normocephalic and atraumatic.  Eyes:     Conjunctiva/sclera: Conjunctivae normal.  Cardiovascular:     Rate and Rhythm: Normal rate and regular rhythm.     Heart sounds: Normal heart sounds.  Pulmonary:     Effort: Pulmonary effort is normal.     Breath sounds: Normal breath sounds.  Abdominal:     General: Bowel sounds are normal.     Palpations: Abdomen is soft.  Musculoskeletal:     Cervical back: Normal range of motion.  Lymphadenopathy:     Cervical: No cervical adenopathy.  Skin:    General: Skin is warm and dry.     Capillary Refill: Capillary  refill takes less than 2 seconds.  Neurological:     Mental Status: She is alert and oriented to person, place, and time.  Psychiatric:        Behavior: Behavior normal.      Musculoskeletal Exam: Cervical spine was in good range of motion.  She had good range of motion of the lumbar spine.  Shoulders, elbows, wrist joints were in good range of motion.  She had bilateral PIP and DIP thickening with no synovitis.  Hip joints and knee joints in good range of motion.  No warmth swelling or effusion was noted.  She has tenderness over the lateral aspect of her right ankle.  Both ankle joints with good range of motion.  Mild PIP and DIP thickening was noted.  CDAI Exam: CDAI Score: -- Patient Global: --; Provider Global: -- Swollen: --; Tender: -- Joint Exam 11/12/2023  No joint exam has been documented for this visit   There is currently no information documented on the homunculus. Go to the Rheumatology activity and complete the homunculus joint exam.  Investigation: No additional findings.  Imaging: US  LIMITED JOINT SPACE STRUCTURES LOW RIGHT(NO LINKED CHARGES) Result Date: 11/10/2023 Diagnostic Limited MSK Ultrasound of: Right ankle Degeneration visible at anterior lateral ankle joint.  Mild joint effusion is present. Peroneal tendons are normal-appearing Impression: Anterior lateral ankle DJD    US  THYROID  Result Date: 10/27/2023 CLINICAL DATA:  Nontoxic goiter EXAM: THYROID  ULTRASOUND TECHNIQUE: Ultrasound examination of the thyroid  gland and adjacent soft tissues was performed. COMPARISON:  None available. FINDINGS: Parenchymal Echotexture: Moderately heterogenous Isthmus: 0.1 cm Right lobe: 4.0 x 1.1 x 0.9 cm Left lobe: 2.6 x 0.9 x 0.8 cm _________________________________________________________ Estimated total number of nodules >/= 1 cm: 0 Number of spongiform nodules >/=  2 cm not described below (TR1): 0 Number of mixed cystic and solid nodules >/= 1.5 cm not described below (TR2):  0 _________________________________________________________ 0.8 x 0.6 x 0.5 cm solid isoechoic right inferior thyroid  nodule (TI-RADS 3) does not meet criteria for imaging surveillance or FNA. IMPRESSION: 1. Moderate diffuse heterogeneity of the thyroid  parenchyma. Asymmetrically shrunken left thyroid  lobe. 2. Solitary subcentimeter right inferior thyroid  nodule does not meet criteria for imaging surveillance or FNA. The above is in keeping with the ACR TI-RADS recommendations - J Am Coll Radiol 2017;14:587-595. Electronically Signed   By: Aliene Lloyd M.D.   On: 10/27/2023 19:59   MR ANKLE RIGHT WO CONTRAST Result Date: 10/24/2023 CLINICAL DATA:  Chronic right ankle pain. Inflammatory arthritis suspected. EXAM: MRI OF THE RIGHT ANKLE WITHOUT CONTRAST TECHNIQUE: Multiplanar, multisequence MR imaging of the ankle was performed. No intravenous contrast was administered. COMPARISON:  Right ankle radiographs 07/28/2023 FINDINGS: TENDONS Peroneal: Minimal peroneus longus and brevis tenosynovitis. The tendons are intact. Posteromedial: Mild posterior tibial tenosynovitis. The flexor digitorum longus and flexor hallucis longus tendons are intact. Anterior: The tibialis anterior, extensor hallucis longus, and extensor digitorum longus tendons are intact. Achilles: Intact. Plantar Fascia: Intact. LIGAMENTS Lateral: The anterior and posterior talofibular, anterior and posterior tibiofibular, and calcaneofibular ligaments are intact. Medial: The tibiotalar deep deltoid and tibial spring ligaments are intact. CARTILAGE Ankle Joint: Mild surface irregularity of the lateral tibiotalar cartilage. Small tibiotalar joint effusion. Subtalar Joints/Sinus Tarsi: Mild thinning of the middle subtalar joint cartilage.Fat is preserved within the sinus tarsi. Bones: Mild dorsal talonavicular cartilage thinning and peripheral osteophytosis. Other: The tarsal tunnel is unremarkable. The Lisfranc ligament complex is intact. IMPRESSION: 1.  Mild posterior tibial tenosynovitis. 2. Minimal peroneus longus and brevis tenosynovitis. 3. 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 Electronically Signed   By: Tanda Lyons M.D.   On: 10/24/2023 17:45    Recent Labs: Lab Results  Component Value Date   WBC 4.0 07/27/2023   HGB 14.2 07/27/2023   PLT 280 07/27/2023   NA 140 07/27/2023   K 4.1 07/27/2023   CL 101 07/27/2023   CO2 26 07/27/2023   GLUCOSE 63 (L) 07/27/2023   BUN 9 07/27/2023   CREATININE 0.96 07/27/2023   BILITOT 0.5 01/14/2023   ALKPHOS 58 01/14/2023   AST 13 01/14/2023   ALT 8 01/14/2023   PROT 6.3 01/14/2023   ALBUMIN 3.6 01/14/2023   CALCIUM  9.3 07/27/2023   GFRAA >60 02/04/2016   QFTBGOLDPLUS NEGATIVE 05/26/2019    Speciality Comments: No specialty comments available.  Procedures:  No procedures performed Allergies: Sulfa antibiotics, Montelukast sodium, and  Vitamin d  (calciferol)   Assessment / Plan:     Visit Diagnoses: Raynaud's disease without gangrene-patient states that Raynauds is currently not active.  Warm clothing and keeping core temperature warm was discussed.  Positive ANA (antinuclear antibody) - ANA 1: 320NS, ENA negative, complements normal, no clinical features of autoimmune disease.  Positive TPO antibody. -She denies any history of oral ulcers, nasal ulcers, malar rash, photosensitivity, Raynaud's or lymphadenopathy.  Recheck labs today.  Plan: Protein / creatinine ratio, urine, CBC with Differential/Platelet, COMPLETE METABOLIC PANEL WITH GFR, ANA, Anti-DNA antibody, double-stranded, C3 and C4, Sedimentation rate  Primary osteoarthritis of both knees -she denies discomfort in her knee joints today.  She is has intermittent discomfort in her knee joints.  Moderate osteoarthritis bilateral knee joints and bilateral mild chondromalacia patella was noted on the previous x-rays.  Chronic pain of right ankle -patient gives history of right ankle joint sprain several years  ago.  She states recently after riding bike she has developed pain and swelling in the right ankle.  She continues to have discomfort.  October 24, 2023 MRI showed tenosynovitis and osteoarthritis.  She has been followed by Dr. Janit.  Patient states she had a cortisone injection without much relief so far.  I will obtain some labs today.  Will contact her once the lab results are available.- Plan: Rheumatoid factor, Cyclic citrul peptide antibody, IgG, Uric acid  DDD (degenerative disc disease), cervical-she gives history of ongoing pain and discomfort in her cervical spine.  She describes radiculopathy to her left scapular region.  MRI results of cervical spine from September 16, 2023 were reviewed.  Which showed multilevel cervical spondylosis and a small central herniated nucleus pulposus at C6-C7 mildly abutting the ventral spinal cord.  She also had moderate foraminal stenosis from C3-C6.  Patient states she tried physical therapy in the past which was not very helpful.  I gave her handout on cervical spine exercises.  I also advised her to follow-up with the spine specialist if her symptoms get worse.  DDD (degenerative disc disease), thoracic-she had MRI of the thoracic spine on September 16, 2023 which was reviewed.  Which showed multilevel spondylosis and a small central disc protrusion from T1-T4.  Degeneration of intervertebral disc of lumbar region without discogenic back pain or lower extremity pain -she continues to have some lower back pain intermittently.  Mild levoscoliosis, mild spondylosis and facet joint arthropathy noted on MRI July 2023.  Patient uses TENS unit and tizanidine as needed.  Myalgia-she has generalized achiness.  CK was normal in the past.  Vitamin D  deficiency-vitamin D  has been normal.  Osteopenia, unspecified location - DEXA is followed by her PCP.  Need for calcium  and vitamin D  intake and regular exercise was emphasized.  Other medical problems are listed as  follows:  Adenocarcinoma of lung, stage 1, right (HCC)  S/P partial lobectomy of lung  Mixed hyperlipidemia  Nontoxic multinodular goiter  Interstitial cystitis (chronic) without hematuria  Bilateral fibrocystic breast disease (FCBD)  Sensorineural hearing loss (SNHL) of both ears  Cyst of kidney, acquired  B12 deficiency  Orders: Orders Placed This Encounter  Procedures   Protein / creatinine ratio, urine   CBC with Differential/Platelet   COMPLETE METABOLIC PANEL WITH GFR   ANA   Anti-DNA antibody, double-stranded   C3 and C4   Sedimentation rate   Rheumatoid factor   Cyclic citrul peptide antibody, IgG   Uric acid   No orders of the defined types were placed in  this encounter.    Follow-Up Instructions: Return in about 1 year (around 11/11/2024) for Osteoarthritis.   Maya Nash, MD  Note - This record has been created using Animal nutritionist.  Chart creation errors have been sought, but may not always  have been located. Such creation errors do not reflect on  the standard of medical care.

## 2023-11-03 NOTE — Progress Notes (Signed)
Right ankle MRI shows tendinitis on the inside and outside part of the ankle as well as a little bit of arthritis into the ankle and midfoot.  Recommend return to clinic to discuss the results in full detail and discussed treatment plan and options.

## 2023-11-04 ENCOUNTER — Ambulatory Visit (INDEPENDENT_AMBULATORY_CARE_PROVIDER_SITE_OTHER): Payer: Medicare Other | Admitting: Internal Medicine

## 2023-11-04 ENCOUNTER — Encounter: Payer: Self-pay | Admitting: Internal Medicine

## 2023-11-04 VITALS — BP 114/80 | HR 86 | Temp 98.4°F | Ht 63.0 in | Wt 118.0 lb

## 2023-11-04 DIAGNOSIS — R070 Pain in throat: Secondary | ICD-10-CM | POA: Diagnosis not present

## 2023-11-04 DIAGNOSIS — R42 Dizziness and giddiness: Secondary | ICD-10-CM

## 2023-11-04 MED ORDER — VITAMIN D3 125 MCG (5000 UT) PO CAPS: 5000.0000 [IU] | ORAL_CAPSULE | Freq: Every day | ORAL | 6 refills | Status: AC

## 2023-11-04 MED ORDER — MECLIZINE HCL 25 MG PO TABS
25.0000 mg | ORAL_TABLET | Freq: Three times a day (TID) | ORAL | 0 refills | Status: AC | PRN
Start: 2023-11-04 — End: ?

## 2023-11-04 MED ORDER — NYSTATIN 100000 UNIT/ML MT SUSP: 5.0000 mL | Freq: Three times a day (TID) | OROMUCOSAL | 0 refills | Status: DC

## 2023-11-04 NOTE — Progress Notes (Signed)
   Subjective:   Patient ID: Jocelyn Parker, female    DOB: 11-07-1950, 72 y.o.   MRN: [id number]  HPI The patient is a 72 YO female coming in for symptoms of sinuses and throat pain. Did recover mostly from sinus infection in Nov but with ongoing mild drainage. This worsened last week and throat pain. Last night intense. Took ibuprofen  and now today it is gone. She is taking sudafed daily and notes that when she wakes up she does not feel normal until she takes it.   Review of Systems  Constitutional:  Positive for activity change.  HENT:  Positive for congestion, postnasal drip, rhinorrhea and sore throat.   Eyes:  Positive for discharge and itching.  Respiratory:  Negative for cough, chest tightness and shortness of breath.   Cardiovascular:  Negative for chest pain, palpitations and leg swelling.  Gastrointestinal:  Negative for abdominal distention, abdominal pain, constipation, diarrhea, nausea and vomiting.  Musculoskeletal: Negative.   Skin: Negative.   Neurological: Negative.   Psychiatric/Behavioral: Negative.      Objective:  Physical Exam Constitutional:      Appearance: She is well-developed.  HENT:     Head: Normocephalic and atraumatic.     Comments: Oropharynx with redness and clear drainage, nose with swollen turbinates, TMs normal bilaterally.  Neck:     Thyroid : No thyromegaly.  Cardiovascular:     Rate and Rhythm: Normal rate and regular rhythm.  Pulmonary:     Effort: Pulmonary effort is normal. No respiratory distress.     Breath sounds: Normal breath sounds. No wheezing or rales.  Abdominal:     General: Bowel sounds are normal. There is no distension.     Palpations: Abdomen is soft.     Tenderness: There is no abdominal tenderness. There is no rebound.  Musculoskeletal:        General: No tenderness.     Cervical back: Normal range of motion.  Lymphadenopathy:     Cervical: No cervical adenopathy.  Skin:    General: Skin is warm and dry.   Neurological:     Mental Status: She is alert and oriented to person, place, and time.     Coordination: Coordination normal.     Vitals:   11/04/23 0930  BP: 114/80  Pulse: 86  Temp: 98.4 F (36.9 C)  TempSrc: Oral  SpO2: 96%  Weight: 118 lb (53.5 kg)  Height: 5' 3 (1.6 m)   Visit time 25 minutes in face to face communication with patient and coordination of care, additional 5 minutes spent in record review, coordination or care, ordering tests, communicating/referring to other healthcare professionals, documenting in medical records all on the same day of the visit for total time 30 minutes spent on the visit.   Assessment & Plan:

## 2023-11-04 NOTE — Assessment & Plan Note (Signed)
 With new throat pain and recent antibiotics course. Rx nystatin  swish and swallow in case of thrush. She does not have clear signs of sinus infection. She does have chronic allergic drainage. Referral to ENT with history of lung cancer if sore throat not resolving she will need assessment. She will continue flonase  and claritin daily. Advised to back off of sudafed and she may have some withdrawal which likely will not last more than 2 weeks if she wants to stop.

## 2023-11-04 NOTE — Patient Instructions (Addendum)
We have sent in nystatin to use 5 mL swish and swallow 3 times a day for 3-5 days.  We will also get you in with the ear nose and throat doctor to check the throat.

## 2023-11-05 ENCOUNTER — Encounter: Payer: Self-pay | Admitting: Internal Medicine

## 2023-11-11 ENCOUNTER — Encounter (INDEPENDENT_AMBULATORY_CARE_PROVIDER_SITE_OTHER): Payer: Self-pay | Admitting: Otolaryngology

## 2023-11-12 ENCOUNTER — Encounter: Payer: Self-pay | Admitting: Rheumatology

## 2023-11-12 ENCOUNTER — Telehealth: Payer: Self-pay | Admitting: Internal Medicine

## 2023-11-12 ENCOUNTER — Ambulatory Visit: Payer: Medicare Other | Attending: Rheumatology | Admitting: Rheumatology

## 2023-11-12 VITALS — BP 111/71 | HR 83 | Resp 14 | Ht 63.5 in | Wt 119.0 lb

## 2023-11-12 DIAGNOSIS — I7300 Raynaud's syndrome without gangrene: Secondary | ICD-10-CM | POA: Insufficient documentation

## 2023-11-12 DIAGNOSIS — H903 Sensorineural hearing loss, bilateral: Secondary | ICD-10-CM | POA: Insufficient documentation

## 2023-11-12 DIAGNOSIS — M8580 Other specified disorders of bone density and structure, unspecified site: Secondary | ICD-10-CM | POA: Diagnosis present

## 2023-11-12 DIAGNOSIS — M170 Bilateral primary osteoarthritis of knee: Secondary | ICD-10-CM | POA: Insufficient documentation

## 2023-11-12 DIAGNOSIS — N6011 Diffuse cystic mastopathy of right breast: Secondary | ICD-10-CM | POA: Insufficient documentation

## 2023-11-12 DIAGNOSIS — N3010 Interstitial cystitis (chronic) without hematuria: Secondary | ICD-10-CM | POA: Diagnosis present

## 2023-11-12 DIAGNOSIS — Z902 Acquired absence of lung [part of]: Secondary | ICD-10-CM | POA: Insufficient documentation

## 2023-11-12 DIAGNOSIS — N281 Cyst of kidney, acquired: Secondary | ICD-10-CM | POA: Insufficient documentation

## 2023-11-12 DIAGNOSIS — N6012 Diffuse cystic mastopathy of left breast: Secondary | ICD-10-CM | POA: Insufficient documentation

## 2023-11-12 DIAGNOSIS — M5030 Other cervical disc degeneration, unspecified cervical region: Secondary | ICD-10-CM | POA: Insufficient documentation

## 2023-11-12 DIAGNOSIS — E782 Mixed hyperlipidemia: Secondary | ICD-10-CM | POA: Diagnosis present

## 2023-11-12 DIAGNOSIS — R768 Other specified abnormal immunological findings in serum: Secondary | ICD-10-CM | POA: Insufficient documentation

## 2023-11-12 DIAGNOSIS — E042 Nontoxic multinodular goiter: Secondary | ICD-10-CM | POA: Diagnosis present

## 2023-11-12 DIAGNOSIS — C3491 Malignant neoplasm of unspecified part of right bronchus or lung: Secondary | ICD-10-CM | POA: Insufficient documentation

## 2023-11-12 DIAGNOSIS — M5134 Other intervertebral disc degeneration, thoracic region: Secondary | ICD-10-CM | POA: Diagnosis present

## 2023-11-12 DIAGNOSIS — E559 Vitamin D deficiency, unspecified: Secondary | ICD-10-CM | POA: Diagnosis present

## 2023-11-12 DIAGNOSIS — E538 Deficiency of other specified B group vitamins: Secondary | ICD-10-CM | POA: Insufficient documentation

## 2023-11-12 DIAGNOSIS — M25571 Pain in right ankle and joints of right foot: Secondary | ICD-10-CM | POA: Diagnosis not present

## 2023-11-12 DIAGNOSIS — M51369 Other intervertebral disc degeneration, lumbar region without mention of lumbar back pain or lower extremity pain: Secondary | ICD-10-CM | POA: Insufficient documentation

## 2023-11-12 DIAGNOSIS — M791 Myalgia, unspecified site: Secondary | ICD-10-CM | POA: Insufficient documentation

## 2023-11-12 DIAGNOSIS — G8929 Other chronic pain: Secondary | ICD-10-CM | POA: Insufficient documentation

## 2023-11-12 NOTE — Telephone Encounter (Signed)
 Copied from CRM [id number]. Topic: Referral - Question >> Nov 11, 2023  4:45 PM Jocelyn Parker wrote: Reason for CRM: pt called and stated that the ENT office her referral was sent to will not be able to see her until the 31st. She stated that she is in great pain and would like her referral to be sent somewhere else where they can see her sooner. Pt asked for a CB at [phone] regarding the status. Please call and advise.

## 2023-11-12 NOTE — Patient Instructions (Signed)
 Cervical Strain and Sprain Rehab Ask your health care provider which exercises are safe for you. Do exercises exactly as told by your health care provider and adjust them as directed. It is normal to feel mild stretching, pulling, tightness, or discomfort as you do these exercises. Stop right away if you feel sudden pain or your pain gets worse. Do not begin these exercises until told by your health care provider. Stretching and range-of-motion exercises Cervical side bending  Using good posture, sit on a stable chair or stand up. Without moving your shoulders, slowly tilt your left / right ear to your shoulder until you feel a stretch in the neck muscles on the opposite side. You should be looking straight ahead. Hold for __________ seconds. Repeat with the other side of your neck. Repeat __________ times. Complete this exercise __________ times a day. Cervical rotation  Using good posture, sit on a stable chair or stand up. Slowly turn your head to the side as if you are looking over your left / right shoulder. Keep your eyes level with the ground. Stop when you feel a stretch along the side and the back of your neck. Hold for __________ seconds. Repeat this by turning to your other side. Repeat __________ times. Complete this exercise __________ times a day. Thoracic extension and pectoral stretch  Roll a towel or a small blanket so it is about 4 inches (10 cm) in diameter. Lie down on your back on a firm surface. Put the towel in the middle of your back across your spine. It should not be under your shoulder blades. Put your hands behind your head and let your elbows fall out to your sides. Hold for __________ seconds. Repeat __________ times. Complete this exercise __________ times a day. Strengthening exercises Upper cervical flexion  Lie on your back with a thin pillow behind your head or a small, rolled-up towel under your neck. Gently tuck your chin toward your chest and nod  your head down to look toward your feet. Do not lift your head off the pillow. Hold for __________ seconds. Release the tension slowly. Relax your neck muscles completely before you repeat this exercise. Repeat __________ times. Complete this exercise __________ times a day. Cervical extension  Stand about 6 inches (15 cm) away from a wall, with your back facing the wall. Place a soft object, about 6-8 inches (15-20 cm) in diameter, between the back of your head and the wall. A soft object could be a small pillow, a ball, or a folded towel. Gently tilt your head back and press into the soft object. Keep your jaw and forehead relaxed. Hold for __________ seconds. Release the tension slowly. Relax your neck muscles completely before you repeat this exercise. Repeat __________ times. Complete this exercise __________ times a day. Posture and body mechanics Body mechanics refer to the movements and positions of your body while you do your daily activities. Posture is part of body mechanics. Good posture and healthy body mechanics can help to relieve stress in your body's tissues and joints. Good posture means that your spine is in its natural S-curve position (your spine is neutral), your shoulders are pulled back slightly, and your head is not tipped forward. The following are general guidelines for using improved posture and body mechanics in your everyday activities. Sitting  When sitting, keep your spine neutral and keep your feet flat on the floor. Use a footrest, if needed, and keep your thighs parallel to the floor. Avoid rounding  your shoulders. Avoid tilting your head forward. When working at a desk or a computer, keep your desk at a height where your hands are slightly lower than your elbows. Slide your chair under your desk so you are close enough to maintain good posture. When working at a computer, place your monitor at a height where you are looking straight ahead and you do not have to  tilt your head forward or downward to look at the screen. Standing  When standing, keep your spine neutral and keep your feet about hip-width apart. Keep a slight bend in your knees. Your ears, shoulders, and hips should line up. When you do a task in which you stand in one place for a long time, place one foot up on a stable object that is 2-4 inches (5-10 cm) high, such as a footstool. This helps keep your spine neutral. Resting When lying down and resting, avoid positions that are most painful for you. Try to support your neck in a neutral position. You can use a contour pillow or a small rolled-up towel. Your pillow should support your neck but not push on it. This information is not intended to replace advice given to you by your health care provider. Make sure you discuss any questions you have with your health care provider. Document Revised: 02/24/2023 Document Reviewed: 05/13/2022 Elsevier Patient Education  2024 [employer] Low Back Sprain or Strain Rehab Ask your health care provider which exercises are safe for you. Do exercises exactly as told by your health care provider and adjust them as directed. It is normal to feel mild stretching, pulling, tightness, or discomfort as you do these exercises. Stop right away if you feel sudden pain or your pain gets worse. Do not begin these exercises until told by your health care provider. Stretching and range-of-motion exercises These exercises warm up your muscles and joints and improve the movement and flexibility of your back. These exercises also help to relieve pain, numbness, and tingling. Lumbar rotation  Lie on your back on a firm bed or the floor with your knees bent. Straighten your arms out to your sides so each arm forms a 90-degree angle (right angle) with a side of your body. Slowly move (rotate) both of your knees to one side of your body until you feel a stretch in your lower back (lumbar). Try not to let your shoulders lift  off the floor. Hold this position for __________ seconds. Tense your abdominal muscles and slowly move your knees back to the starting position. Repeat this exercise on the other side of your body. Repeat __________ times. Complete this exercise __________ times a day. Single knee to chest  Lie on your back on a firm bed or the floor with both legs straight. Bend one of your knees. Use your hands to move your knee up toward your chest until you feel a gentle stretch in your lower back and buttock. Hold your leg in this position by holding on to the front of your knee. Keep your other leg as straight as possible. Hold this position for __________ seconds. Slowly return to the starting position. Repeat with your other leg. Repeat __________ times. Complete this exercise __________ times a day. Prone extension on elbows  Lie on your abdomen on a firm bed or the floor (prone position). Prop yourself up on your elbows. Use your arms to help lift your chest up until you feel a gentle stretch in your abdomen and your lower back.  This will place some of your body weight on your elbows. If this is uncomfortable, try stacking pillows under your chest. Your hips should stay down, against the surface that you are lying on. Keep your hip and back muscles relaxed. Hold this position for __________ seconds. Slowly relax your upper body and return to the starting position. Repeat __________ times. Complete this exercise __________ times a day. Strengthening exercises These exercises build strength and endurance in your back. Endurance is the ability to use your muscles for a long time, even after they get tired. Pelvic tilt This exercise strengthens the muscles that lie deep in the abdomen. Lie on your back on a firm bed or the floor with your legs extended. Bend your knees so they are pointing toward the ceiling and your feet are flat on the floor. Tighten your lower abdominal muscles to press your  lower back against the floor. This motion will tilt your pelvis so your tailbone points up toward the ceiling instead of pointing to your feet or the floor. To help with this exercise, you may place a small towel under your lower back and try to push your back into the towel. Hold this position for __________ seconds. Let your muscles relax completely before you repeat this exercise. Repeat __________ times. Complete this exercise __________ times a day. Alternating arm and leg raises  Get on your hands and knees on a firm surface. If you are on a hard floor, you may want to use padding, such as an exercise mat, to cushion your knees. Line up your arms and legs. Your hands should be directly below your shoulders, and your knees should be directly below your hips. Lift your left leg behind you. At the same time, raise your right arm and straighten it in front of you. Do not lift your leg higher than your hip. Do not lift your arm higher than your shoulder. Keep your abdominal and back muscles tight. Keep your hips facing the ground. Do not arch your back. Keep your balance carefully, and do not hold your breath. Hold this position for __________ seconds. Slowly return to the starting position. Repeat with your right leg and your left arm. Repeat __________ times. Complete this exercise __________ times a day. Abdominal set with straight leg raise  Lie on your back on a firm bed or the floor. Bend one of your knees and keep your other leg straight. Tense your abdominal muscles and lift your straight leg up, 4-6 inches (10-15 cm) off the ground. Keep your abdominal muscles tight and hold this position for __________ seconds. Do not hold your breath. Do not arch your back. Keep it flat against the ground. Keep your abdominal muscles tense as you slowly lower your leg back to the starting position. Repeat with your other leg. Repeat __________ times. Complete this exercise __________ times a  day. Single leg lower with bent knees Lie on your back on a firm bed or the floor. Tense your abdominal muscles and lift your feet off the floor, one foot at a time, so your knees and hips are bent in 90-degree angles (right angles). Your knees should be over your hips and your lower legs should be parallel to the floor. Keeping your abdominal muscles tense and your knee bent, slowly lower one of your legs so your toe touches the ground. Lift your leg back up to return to the starting position. Do not hold your breath. Do not let your back arch. Keep  your back flat against the ground. Repeat with your other leg. Repeat __________ times. Complete this exercise __________ times a day. Posture and body mechanics Good posture and healthy body mechanics can help to relieve stress in your body's tissues and joints. Body mechanics refers to the movements and positions of your body while you do your daily activities. Posture is part of body mechanics. Good posture means: Your spine is in its natural S-curve position (neutral). Your shoulders are pulled back slightly. Your head is not tipped forward (neutral). Follow these guidelines to improve your posture and body mechanics in your everyday activities. Standing  When standing, keep your spine neutral and your feet about hip-width apart. Keep a slight bend in your knees. Your ears, shoulders, and hips should line up. When you do a task in which you stand in one place for a long time, place one foot up on a stable object that is 2-4 inches (5-10 cm) high, such as a footstool. This helps keep your spine neutral. Sitting  When sitting, keep your spine neutral and keep your feet flat on the floor. Use a footrest, if necessary, and keep your thighs parallel to the floor. Avoid rounding your shoulders, and avoid tilting your head forward. When working at a desk or a computer, keep your desk at a height where your hands are slightly lower than your elbows.  Slide your chair under your desk so you are close enough to maintain good posture. When working at a computer, place your monitor at a height where you are looking straight ahead and you do not have to tilt your head forward or downward to look at the screen. Resting When lying down and resting, avoid positions that are most painful for you. If you have pain with activities such as sitting, bending, stooping, or squatting, lie in a position in which your body does not bend very much. For example, avoid curling up on your side with your arms and knees near your chest (fetal position). If you have pain with activities such as standing for a long time or reaching with your arms, lie with your spine in a neutral position and bend your knees slightly. Try the following positions: Lying on your side with a pillow between your knees. Lying on your back with a pillow under your knees. Lifting  When lifting objects, keep your feet at least shoulder-width apart and tighten your abdominal muscles. Bend your knees and hips and keep your spine neutral. It is important to lift using the strength of your legs, not your back. Do not lock your knees straight out. Always ask for help to lift heavy or awkward objects. This information is not intended to replace advice given to you by your health care provider. Make sure you discuss any questions you have with your health care provider. Document Revised: 02/24/2023 Document Reviewed: 01/08/2021 Elsevier Patient Education  2024 [employer].

## 2023-11-12 NOTE — Telephone Encounter (Signed)
 I have already sent patient a message through my chart informing her that Dr Okey Dupre is out of office and will not return until tomorrow. Patient also want another antibiotic but this can be addressed by Dr Okey Dupre when she returns

## 2023-11-13 NOTE — Telephone Encounter (Signed)
 Patient unsure if she is allergic to Singulair

## 2023-11-14 NOTE — Progress Notes (Signed)
 Urine protein creatinine ratio normal, CBC and CMP normal, double-stranded DNA negative, complements normal, sed rate normal rheumatoid factor negative, uric acid normal, anti-CCP and ANA pending.

## 2023-11-14 NOTE — Telephone Encounter (Signed)
 Please advise for patient

## 2023-11-15 LAB — COMPLETE METABOLIC PANEL WITH GFR
AG Ratio: 1.3 (calc) (ref 1.0–2.5)
ALT: 13 U/L (ref 6–29)
AST: 18 U/L (ref 10–35)
Albumin: 4 g/dL (ref 3.6–5.1)
Alkaline phosphatase (APISO): 62 U/L (ref 37–153)
BUN: 11 mg/dL (ref 7–25)
CO2: 30 mmol/L (ref 20–32)
Calcium: 9.2 mg/dL (ref 8.6–10.4)
Chloride: 102 mmol/L (ref 98–110)
Creat: 0.83 mg/dL (ref 0.60–1.00)
Globulin: 3 g/dL (ref 1.9–3.7)
Glucose, Bld: 75 mg/dL (ref 65–99)
Potassium: 4.1 mmol/L (ref 3.5–5.3)
Sodium: 139 mmol/L (ref 135–146)
Total Bilirubin: 0.4 mg/dL (ref 0.2–1.2)
Total Protein: 7 g/dL (ref 6.1–8.1)
eGFR: 76 mL/min/{1.73_m2} (ref >=60)

## 2023-11-15 LAB — ANTI-NUCLEAR AB-TITER (ANA TITER)
ANA TITER: 1:80 {titer} — ABNORMAL HIGH
ANA Titer 1: 1:40 {titer} — ABNORMAL HIGH

## 2023-11-15 LAB — CBC WITH DIFFERENTIAL/PLATELET
Absolute Lymphocytes: 1348 {cells}/uL (ref 850–3900)
Absolute Monocytes: 368 {cells}/uL (ref 200–950)
Basophils Absolute: 51 {cells}/uL (ref 0–200)
Basophils Relative: 1.1 %
Eosinophils Absolute: 41 {cells}/uL (ref 15–500)
Eosinophils Relative: 0.9 %
HCT: 40.7 % (ref 35.0–45.0)
Hemoglobin: 13.4 g/dL (ref 11.7–15.5)
MCH: 31.1 pg (ref 27.0–33.0)
MCHC: 32.9 g/dL (ref 32.0–36.0)
MCV: 94.4 fL (ref 80.0–100.0)
MPV: 9.9 fL (ref 7.5–12.5)
Monocytes Relative: 8 %
Neutro Abs: 2792 {cells}/uL (ref 1500–7800)
Neutrophils Relative %: 60.7 %
Platelets: 266 10*3/uL (ref 140–400)
RBC: 4.31 10*6/uL (ref 3.80–5.10)
RDW: 12.9 % (ref 11.0–15.0)
Total Lymphocyte: 29.3 %
WBC: 4.6 10*3/uL (ref 3.8–10.8)

## 2023-11-15 LAB — ANTI-DNA ANTIBODY, DOUBLE-STRANDED: ds DNA Ab: 1 [IU]/mL

## 2023-11-15 LAB — PROTEIN / CREATININE RATIO, URINE
Creatinine, Urine: 194 mg/dL (ref 20–275)
Protein/Creat Ratio: 52 mg/g{creat} (ref 24–184)
Protein/Creatinine Ratio: 0.052 mg/mg{creat} (ref 0.024–0.184)
Total Protein, Urine: 10 mg/dL (ref 5–24)

## 2023-11-15 LAB — ANA: Anti Nuclear Antibody (ANA): POSITIVE — AB

## 2023-11-15 LAB — C3 AND C4
C3 Complement: 127 mg/dL (ref 83–193)
C4 Complement: 23 mg/dL (ref 15–57)

## 2023-11-15 LAB — CYCLIC CITRUL PEPTIDE ANTIBODY, IGG: Cyclic Citrullin Peptide Ab: <16 U

## 2023-11-15 LAB — SEDIMENTATION RATE: Sed Rate: 17 mm/h (ref 0–30)

## 2023-11-15 LAB — RHEUMATOID FACTOR: Rheumatoid fact SerPl-aCnc: <10 [IU]/mL (ref <14)

## 2023-11-15 LAB — URIC ACID: Uric Acid, Serum: 4.3 mg/dL (ref 2.5–7.0)

## 2023-11-17 MED ORDER — LIDOCAINE VISCOUS HCL 2 % MT SOLN: 15.0000 mL | Freq: Two times a day (BID) | OROMUCOSAL | 0 refills | Status: DC | PRN

## 2023-11-17 MED ORDER — AZELASTINE HCL 0.1 % NA SOLN: 2.0000 | Freq: Two times a day (BID) | NASAL | 12 refills | Status: DC

## 2023-11-17 NOTE — Addendum Note (Signed)
 Addended by: Hillard Danker A on: 11/17/2023 09:44 AM   Modules accepted: Orders

## 2023-11-17 NOTE — Progress Notes (Signed)
 I returned his call and discussed labs with her.  Patient voiced understanding.

## 2023-11-17 NOTE — Progress Notes (Signed)
 ANA is low titer positive and not a significant value.

## 2023-11-20 NOTE — Progress Notes (Signed)
I, Jocelyn Parker, CMA acting as a scribe for Jocelyn Graham, MD.  Jocelyn Parker is a 73 y.o. female who presents to [facility] today for f/u R ankle pain w/ MRI review. Pt was last seen by Dr. Denyse Amass on 10/17/23 and a MRI was ordered.   Today, pt reports continued ankle pain and swelling. Brought ankle brace in today. Review MRI and discuss activity modifications.   Dx testing: 10/19/23 R ankle MRI 07/28/23 R ankle XR             07/27/23 LE bilat vasc US  Pertinent review of systems: No fevers or chills  Relevant historical information: History of right lung cancer. She is traveling to [place] on February 3 to do some sightseeing and some hiking.  Exam:  BP 110/72   Pulse 80   Ht 5' 3.5" (1.613 m)   Wt 117 lb (53.1 kg)   SpO2 98%   BMI 20.40 kg/m  General: Well Developed, well nourished, and in no acute distress.   MSK: Right ankle: Normal-appearing Normal motion. Tender palpation ATFL region.    Lab and Radiology Results  EXAM: MRI OF THE RIGHT ANKLE WITHOUT CONTRAST   TECHNIQUE: Multiplanar, multisequence MR imaging of the ankle was performed. No intravenous contrast was administered.   COMPARISON:  Right ankle radiographs 07/28/2023   FINDINGS: TENDONS   Peroneal: Minimal peroneus longus and brevis tenosynovitis. The tendons are intact.   Posteromedial: Mild posterior tibial tenosynovitis. The flexor digitorum longus and flexor hallucis longus tendons are intact.   Anterior: The tibialis anterior, extensor hallucis longus, and extensor digitorum longus tendons are intact.   Achilles: Intact.   Plantar Fascia: Intact.   LIGAMENTS   Lateral: The anterior and posterior talofibular, anterior and posterior tibiofibular, and calcaneofibular ligaments are intact.   Medial: The tibiotalar deep deltoid and tibial spring ligaments are intact.   CARTILAGE   Ankle Joint: Mild surface irregularity of the lateral  tibiotalar cartilage. Small tibiotalar joint effusion.   Subtalar Joints/Sinus Tarsi: Mild thinning of the middle subtalar joint cartilage.Fat is preserved within the sinus tarsi.   Bones: Mild dorsal talonavicular cartilage thinning and peripheral osteophytosis.   Other: The tarsal tunnel is unremarkable. The Lisfranc ligament complex is intact.   IMPRESSION: 1. Mild posterior tibial tenosynovitis. 2. Minimal peroneus longus and brevis tenosynovitis. 3. 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     Electronically Signed   By: Neita Garnet M.D.   On: 10/24/2023 17:45 I, Jocelyn Parker, personally (independently) visualized and performed the interpretation of the images attached in this note.      Assessment and Plan: 73 y.o. female with chronic right ankle pain due to DJD based on MRI appearance.  She does have tendinopathy on MRI but that is not located where her primary pain is.  She had a subtalar injection at podiatry with marginal benefits.  She has been doing home exercise program also with some marginal benefit.  Plan for physical therapy.  It is okay for her to start doing more.  If she finds that her pain is worsening prior to this trip that she has going to [place] in about 3 weeks she can return for injection.   PDMP not reviewed this encounter. Orders Placed This Encounter  Procedures   Ambulatory referral to Physical Therapy    Referral Priority:   Routine    Referral Type:   Physical Medicine    Referral Reason:   Specialty Services  Required    Requested Specialty:   Physical Therapy    Number of Visits Requested:   1   No orders of the defined types were placed in this encounter.    Discussed warning signs or symptoms. Please see discharge instructions. Patient expresses understanding.   The above documentation has been reviewed and is accurate and complete Jocelyn Parker, M.D.

## 2023-11-21 ENCOUNTER — Encounter: Payer: Self-pay | Admitting: Family Medicine

## 2023-11-21 ENCOUNTER — Ambulatory Visit (INDEPENDENT_AMBULATORY_CARE_PROVIDER_SITE_OTHER): Payer: Medicare Other | Admitting: Family Medicine

## 2023-11-21 VITALS — BP 110/72 | HR 80 | Ht 63.5 in | Wt 117.0 lb

## 2023-11-21 DIAGNOSIS — G8929 Other chronic pain: Secondary | ICD-10-CM | POA: Diagnosis not present

## 2023-11-21 DIAGNOSIS — M25571 Pain in right ankle and joints of right foot: Secondary | ICD-10-CM

## 2023-11-21 NOTE — Patient Instructions (Signed)
Thank you for coming in today.   I've referred you to Physical Therapy.  Let us know if you don't hear from them in one week.   Recheck in 8 weeks.   If not improved next step could be repeat injection.   Continue the ankle brace especially with activity.   Ok to do a shot before this trip if you need it.

## 2023-12-03 ENCOUNTER — Other Ambulatory Visit: Payer: Self-pay

## 2023-12-03 ENCOUNTER — Ambulatory Visit: Payer: Medicare Other | Attending: Family Medicine

## 2023-12-03 DIAGNOSIS — G8929 Other chronic pain: Secondary | ICD-10-CM | POA: Diagnosis not present

## 2023-12-03 DIAGNOSIS — M7751 Other enthesopathy of right foot: Secondary | ICD-10-CM | POA: Diagnosis present

## 2023-12-03 DIAGNOSIS — M25571 Pain in right ankle and joints of right foot: Secondary | ICD-10-CM | POA: Diagnosis present

## 2023-12-03 DIAGNOSIS — R52 Pain, unspecified: Secondary | ICD-10-CM | POA: Insufficient documentation

## 2023-12-03 NOTE — Therapy (Signed)
OUTPATIENT PHYSICAL THERAPY LOWER EXTREMITY EVALUATION   Patient Name: Jocelyn Parker MRN: [id number] DOB:09/17/1951, 73 y.o., female Today's Date: 12/03/2023  END OF SESSION:  PT End of Session - 12/03/23 1017     Visit Number 1    Date for PT Re-Evaluation 01/28/24    Progress Note Due on Visit 10    PT Start Time 1015    PT Stop Time 1100    PT Time Calculation (min) 45 min    Activity Tolerance Patient tolerated treatment well    Behavior During Therapy [facility] for tasks assessed/performed             Past Medical History:  Diagnosis Date   Allergy    sulfa   Chronic interstitial cystitis    Lung cancer (HCC)    per patient    Thyroid disease    Past Surgical History:  Procedure Laterality Date   ABDOMINAL HYSTERECTOMY     lumps removed from breast     1970's ,1990's and in 2000's  nono cancer    LUNG SURGERY  04/05/2020   Patient Active Problem List   Diagnosis Date Noted   Throat pain 11/04/2023   Acute right ankle pain 07/28/2023   Coronary artery calcification 12/02/2022   Leg pain 11/12/2022   Familial hypercholesterolemia 02/12/2022   Celiac disease 07/17/2021   Impaired fasting glucose 07/17/2021   Vertigo 03/25/2021   Gastritis with intestinal metaplasia of stomach 01/10/2021   S/P partial lobectomy of lung 04/12/2020   Nodule of upper lobe of right lung 04/05/2020   Bilateral breast cysts 12/27/2019   Multiple cysts of breast 12/27/2019   Malignant neoplasm of upper lobe of right lung (HCC) 10/12/2019   Sensorineural hearing loss (SNHL) of both ears 06/20/2019   Adenocarcinoma of lung, stage 1, right (HCC) 05/26/2019   Tinnitus of both ears 05/12/2019   B12 deficiency 04/11/2019   Cyst of ovary 04/01/2019   Menopause syndrome 04/01/2019   Osteopenia 04/01/2019   Vitamin D deficiency 05/24/2015   Xanthoma of eyelid 12/21/2014   Cyst of kidney, acquired 12/20/2014   Interstitial cystitis (chronic) without hematuria 03/20/2012   Shortness of  breath 08/16/2008   Nontoxic multinodular goiter 08/08/2007    PCP: Hillard Danker, MD  REFERRING PROVIDER: Clementeen Graham, MD  REFERRING DIAG: Chronic pain of R ankle  THERAPY DIAG:  Pain in right ankle and joints of right foot  Tendonitis of ankle, right  Pain aggravated by stair climbing  Rationale for Evaluation and Treatment: Rehabilitation  ONSET DATE: October 2024  SUBJECTIVE:   SUBJECTIVE STATEMENT: Reports edema R lat ankle, some pain R ant /L ankle jt, primarily when she rests, much worse at night.  Had injection several months ago without much relief.  Noticed that it started after she rode a bike for 3-4 days in a row.  She used to walk 3 mi several times a week and has stopped walking with this new problem. Wears a figure 8 brace most days which feels good  PERTINENT HISTORY: See above, saw sports medicine MD and had diagnostic ultrasound, referred to PT for eval and treat PAIN:  Are you having pain? Yes: NPRS scale: 0-2 Pain location: R ant lat ankle jt  Pain description: deep ache, almost a numbness Aggravating factors: down steps, bicycling, prolonged walking Relieving factors: meds, brace/external support  PRECAUTIONS: None  RED FLAGS: None   WEIGHT BEARING RESTRICTIONS: No  FALLS:  Has patient fallen in last 6 months? No  LIVING ENVIRONMENT:  Lives with: lives with their family Lives in: House/apartment Stairs: unknown Has following equipment at home: None  OCCUPATION: retired, travelling soon to [place] for 2 weeks   PLOF: Independent  PATIENT GOALS: get rid of the pain and maintain activity level  NEXT MD VISIT: 2 months  OBJECTIVE:  Note: Objective measures were completed at Evaluation unless otherwise noted.  DIAGNOSTIC FINDINGS: IMPRESSION: 1. Mild posterior tibial tenosynovitis. 2. Minimal peroneus longus and brevis tenosynovitis. 3. 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     Electronically Signed    By: Neita Garnet M.D.   On: 10/24/2023 17:45  PATIENT SURVEYS:  FAAM Activities of Daily Living Subscale: 64 / 84 or 76 % function, 24% disability  COGNITION: Overall cognitive status: Within functional limits for tasks assessed     SENSATION: WFL  EDEMA:  Lateral malleolus mod edema  MUSCLE LENGTH: Hamstrings: Right wnl deg; Left wnl deg Maisie Fus test: Right nt deg; Left nt deg  POSTURE: lean, observation of B feet indicates normal less than 1 cm navicular drop B, L slighter greater than R  PALPATION: pt tender R ankle over R ant talocrual jt line Thickened and tender R peroneal musculature  LOWER EXTREMITY ROM: R ankle dorsiflexion 10, L ankle dorsiflexion 14 Plantarflexion, inv, ev all WNL B B hips, knees ROM wnl  LOWER EXTREMITY MMT: gross MMT wnl B LE's Able to heel and toe walk B  FUNCTIONAL TESTS:  Forward heel taps from 2 " step, B over 10 reps each, pain with L heel tap with weight bearing R ankle dorsiflexion  Unilateral standing, over 30 sec on each leg with minimal sway  GAIT: Distance walked: in clinic over 70', no device                                                                                                                          TREATMENT DATE: 12/03/23: Evaluation,  Instructed in mobilization with movement, jt mobs R talonavicular jt with green t band , combined with forward lunges.  Also instructed in and pt was able to replicate self cross friction massage R peroneal muscle belly with a cylinder, advised to use water bottle or rolling pin, etc at home.     PATIENT EDUCATION:  Education details: POC, goals Person educated: Patient Education method: Explanation, Demonstration, and Tactile cues Education comprehension: verbalized understanding, returned demonstration, and verbal cues required  HOME EXERCISE PROGRAM: See above  ASSESSMENT:  CLINICAL IMPRESSION: Patient is a 73  y.o. female who was evaluated today by physical therapy  due to chronic R lateral ankle pain, edema, tendinosis. She presents as very high level, had some loss of R ankle dorsiflexion ROM as compared to L, with some replication of Sx with stepping down L with wbing on R LE.  Also some tenderness R peroneals.  She is going on a 2 1/2 wk vacation so today's visit consisted of evaluation and instructions in techniques to use on her trip to  improve her r ankle Sx.  Also advised her to go to local running shoe store for assessment of appropriate foot wear.  OBJECTIVE IMPAIRMENTS: decreased activity tolerance, decreased ROM, hypomobility, and pain.   ACTIVITY LIMITATIONS: sleeping, stairs, and locomotion level  PARTICIPATION LIMITATIONS: community activity and yard work  PERSONAL FACTORS: Behavior pattern, Time since onset of injury/illness/exacerbation, and 1 comorbidity: Hashimoto  are also affecting patient's functional outcome.   REHAB POTENTIAL: Good  CLINICAL DECISION MAKING: Stable/uncomplicated  EVALUATION COMPLEXITY: Low   GOALS: Goals reviewed with patient? Yes   LONG TERM GOALS: Pt out of town for 3 weeks after evaluation, so lont term goals only: Target date: 01/28/24: 8 weeks   I HEP Baseline: initiated at eval Goal status: INITIAL  2.  Improve R ankle AROM for dorsiflexion to 14 degrees or greater for improved gait pattern , efficiency Baseline:  Goal status: INITIAL  3.  Patient able to complete 10 eccentric L heel taps from 4" step, forward , without R ankle pain Baseline:  Goal status: INITIAL  4.  FAAM Activities of Daily Living Subscale: 64 / 84 or 76 % function, 24% disability improve to 14 % disability for R ankle Baseline:  Goal status: INITIAL    PLAN:  PT FREQUENCY: 1x/week  PT DURATION: 8 weeks  PLANNED INTERVENTIONS: 97110-Therapeutic exercises, 97530- Therapeutic activity, 97112- Neuromuscular re-education, 97535- Self Care, 13086- Manual therapy, and 97033- Ionotophoresis 4mg /ml Dexamethasone  PLAN  FOR NEXT SESSION: assess flexibility, pain, utilize manual techniques, stretching, deep tissue release R peroneals with mass, myofascial release, TPDN? Progress with eccentric training R peroneals   Fiore Detjen L Arali Somera, PT, DPT,OCS 12/03/2023, 2:13 PM

## 2023-12-05 ENCOUNTER — Ambulatory Visit (INDEPENDENT_AMBULATORY_CARE_PROVIDER_SITE_OTHER): Payer: Medicare Other | Admitting: Otolaryngology

## 2023-12-05 ENCOUNTER — Encounter (INDEPENDENT_AMBULATORY_CARE_PROVIDER_SITE_OTHER): Payer: Self-pay | Admitting: Otolaryngology

## 2023-12-05 VITALS — BP 109/69 | HR 78 | Ht 63.0 in | Wt 118.0 lb

## 2023-12-05 DIAGNOSIS — R0982 Postnasal drip: Secondary | ICD-10-CM

## 2023-12-05 DIAGNOSIS — J312 Chronic pharyngitis: Secondary | ICD-10-CM

## 2023-12-05 DIAGNOSIS — J3089 Other allergic rhinitis: Secondary | ICD-10-CM

## 2023-12-05 DIAGNOSIS — R490 Dysphonia: Secondary | ICD-10-CM | POA: Diagnosis not present

## 2023-12-05 DIAGNOSIS — H9313 Tinnitus, bilateral: Secondary | ICD-10-CM | POA: Diagnosis not present

## 2023-12-05 DIAGNOSIS — K219 Gastro-esophageal reflux disease without esophagitis: Secondary | ICD-10-CM | POA: Diagnosis not present

## 2023-12-05 DIAGNOSIS — R0989 Other specified symptoms and signs involving the circulatory and respiratory systems: Secondary | ICD-10-CM

## 2023-12-05 MED ORDER — AZELASTINE HCL 0.1 % NA SOLN: 2.0000 | Freq: Every day | NASAL | 12 refills | Status: DC

## 2023-12-05 MED ORDER — FLUTICASONE PROPIONATE 50 MCG/ACT NA SUSP: 2.0000 | Freq: Every day | NASAL | 6 refills | Status: AC

## 2023-12-05 MED ORDER — FAMOTIDINE 20 MG PO TABS: 20.0000 mg | ORAL_TABLET | Freq: Two times a day (BID) | ORAL | 3 refills | Status: DC

## 2023-12-05 MED ORDER — DESLORATADINE 5 MG PO TABS: 5.0000 mg | ORAL_TABLET | Freq: Every day | ORAL | 3 refills | Status: DC

## 2023-12-05 NOTE — Patient Instructions (Signed)

## 2023-12-05 NOTE — Progress Notes (Signed)
ENT CONSULT:  Reason for Consult: throat pain x 4 months hoarseness   HPI: Discussed the use of AI scribe software for clinical note transcription with the patient, who gave verbal consent to proceed.  History of Present Illness   The patient is a 73 yoF, with hx pf Hashimoto's thyroiditis, presents with throat pain and hoarseness. They were referred by Dr. Hillard Danker for evaluation of persistent throat pain and hoarseness.  Throat pain and hoarseness have been present for approximately three to four months, beginning in late October or early November, 2024. The pain is variable and sometimes occurs when the throat is touched along her neck especially on the right side. A history of laryngitis in early January was treated with antibiotics, providing some relief but not complete resolution of symptoms. No current pain when swallowing is reported, but mild hoarseness persists.  When she had laryngitis mucus initially clear became infected, primarily in the nose, and was treated with antibiotics. Nasal sprays such as Flonase and azelastine have been used for postnasal drip, though not regularly. A special mouthwash has helped ease throat soreness (Nystatin).  She reports dryness in the mouth is especially noted upon waking, with uncertainty about sleeping with the mouth open. No history of smoking or heavy alcohol use is reported.  She reports ringing in the ears, with no recent hearing test conducted. A history of Hashimoto's thyroiditis, reports normal thyroid U/S recently.  Records Reviewed:  Office visit 11/04/23 73 YO female coming in for symptoms of sinuses and throat pain. Did recover mostly from sinus infection in Nov but with ongoing mild drainage. This worsened last week and throat pain. Last night intense. Took ibuprofen and now today it is gone. She is taking sudafed daily and notes that when she wakes up she does not feel normal until she takes it.   With new throat pain and  recent antibiotics course. Rx nystatin swish and swallow in case of thrush. She does not have clear signs of sinus infection. She does have chronic allergic drainage. Referral to ENT with history of lung cancer if sore throat not resolving she will need assessment. She will continue flonase and claritin daily. Advised to back off of sudafed and she may have some withdrawal which likely will not last more than 2 weeks if she wants to stop.    Past Medical History:  Diagnosis Date   Allergy    sulfa   Chronic interstitial cystitis    Lung cancer (HCC)    per patient    Thyroid disease     Past Surgical History:  Procedure Laterality Date   ABDOMINAL HYSTERECTOMY     lumps removed from breast     1970's ,1990's and in 2000's  nono cancer    LUNG SURGERY  04/05/2020    Family History  Problem Relation Age of Onset   Renal cancer Mother    Heart attack Father    Stroke Maternal Grandmother    Heart attack Paternal Grandfather    Multiple sclerosis Sister    Cancer Brother     Social History:  reports that she has never smoked. She has never been exposed to tobacco smoke. She has never used smokeless tobacco. She reports current alcohol use. She reports that she does not use drugs.  Allergies:  Allergies  Allergen Reactions   Sulfa Antibiotics Hives   Montelukast Sodium     dizziness   Vitamin D (Calciferol) Other (See Comments)    Pt stated can  only take prescription     Medications: I have reviewed the patient's current medications.  The PMH, PSH, Medications, Allergies, and SH were reviewed and updated.  ROS: Constitutional: Negative for fever, weight loss and weight gain. Cardiovascular: Negative for chest pain and dyspnea on exertion. Respiratory: Is not experiencing shortness of breath at rest. Gastrointestinal: Negative for nausea and vomiting. Neurological: Negative for headaches. Psychiatric: The patient is not nervous/anxious  Blood pressure 109/69, pulse 78,  height 5\' 3"  (1.6 m), weight 118 lb (53.5 kg), SpO2 97%.  PHYSICAL EXAM:  Exam: General: Well-developed, well-nourished Communication and Voice: slightly raspy Respiratory Respiratory effort: Equal inspiration and expiration without stridor Cardiovascular Peripheral Vascular: Warm extremities with equal color/perfusion Eyes: No nystagmus with equal extraocular motion bilaterally Neuro/Psych/Balance: Patient oriented to person, place, and time; Appropriate mood and affect; Gait is intact with no imbalance; Cranial nerves I-XII are intact Head and Face Inspection: Normocephalic and atraumatic without mass or lesion Palpation: Facial skeleton intact without bony stepoffs Salivary Glands: No mass or tenderness Facial Strength: Facial motility symmetric and full bilaterally ENT Pinna: External ear intact and fully developed External canal: Canal is patent with intact skin Tympanic Membrane: Clear and mobile External Nose: No scar or anatomic deformity Internal Nose: Septum is relatively straight. No polyp, or purulence. Mucosal edema and erythema present.  Bilateral inferior turbinate hypertrophy.  Lips, Teeth, and gums: Mucosa and teeth intact and viable TMJ: No pain to palpation with full mobility Oral cavity/oropharynx: No erythema or exudate, no lesions present Nasopharynx: No mass or lesion with intact mucosa Hypopharynx: Intact mucosa without pooling of secretions Larynx Glottic: Full true vocal cord mobility without lesion or mass mild VF atrophy  Supraglottic: Normal appearing epiglottis and AE folds Interarytenoid Space: Moderate pachydermia&edema Subglottic Space: Patent without lesion or edema Neck Neck and Trachea: Midline trachea without mass or lesion Thyroid: No mass or nodularity Lymphatics: No lymphadenopathy  Procedure:  Preoperative diagnosis: chronic throat discomfort  Postoperative diagnosis:   Same + GERD LPR  Procedure: Flexible fiberoptic  laryngoscopy  Surgeon: Ashok Croon, MD  Anesthesia: Topical lidocaine and Afrin Complications: None Condition is stable throughout exam  Indications and consent:  The patient presents to the clinic with Indirect laryngoscopy view was incomplete. Thus it was recommended that they undergo a flexible fiberoptic laryngoscopy. All of the risks, benefits, and potential complications were reviewed with the patient preoperatively and verbal informed consent was obtained.  Procedure: The patient was seated upright in the clinic. Topical lidocaine and Afrin were applied to the nasal cavity. After adequate anesthesia had occurred, I then proceeded to pass the flexible telescope into the nasal cavity. The nasal cavity was patent without rhinorrhea or polyp. The nasopharynx was also patent without mass or lesion. The base of tongue was visualized and was normal. There were no signs of pooling of secretions in the piriform sinuses. The true vocal folds were mobile bilaterally. There were no signs of glottic or supraglottic mucosal lesion or mass. There was moderate interarytenoid pachydermia and post cricoid edema. The telescope was then slowly withdrawn and the patient tolerated the procedure throughout.  Studies Reviewed:10/27/23 FINDINGS: Parenchymal Echotexture: Moderately heterogenous   Isthmus: 0.1 cm   Right lobe: 4.0 x 1.1 x 0.9 cm   Left lobe: 2.6 x 0.9 x 0.8 cm   _________________________________________________________   Estimated total number of nodules >/= 1 cm: 0   Number of spongiform nodules >/=  2 cm not described below (TR1): 0   Number of mixed cystic  and solid nodules >/= 1.5 cm not described below (TR2): 0   _________________________________________________________   0.8 x 0.6 x 0.5 cm solid isoechoic right inferior thyroid nodule (TI-RADS 3) does not meet criteria for imaging surveillance or FNA.   IMPRESSION: 1. Moderate diffuse heterogeneity of the thyroid  parenchyma. Asymmetrically shrunken left thyroid lobe. 2. Solitary subcentimeter right inferior thyroid nodule does not meet criteria for imaging surveillance or FNA.  Assessment/Plan: Encounter Diagnoses  Name Primary?   Chronic sore throat Yes   Dysphonia    Chronic GERD    Chronic throat clearing    Environmental and seasonal allergies    Post-nasal drip    Tinnitus of both ears     Assessment and Plan    Chronic Throat Pain x 3-4 months  Chronic throat pain for 3-4 months, exacerbated by touching the neck especially on the left side near level of thyroid and swallowing. No evidence of mass or tumor on examination, no evidence of thrush, flexible laryngoscopy today with mild VF atrophy, age related, and clear post-nasal drainage/changes c/w GERD LPR. Likely related to postnasal drainage and silent reflux throat irritation. Differential includes muscle soreness from throat clearing and low-grade inflammation from Hashimoto's thyroiditis causing pain when she palpates along the neck (near thyroid gland). Discussed that postnasal drainage and silent reflux can irritate the throat, and that throat clearing or straining when talking can also contribute to discomfort. Treating reflux and postnasal drainage daily is necessary for improvement. She was treated with Nystatin by PCP for presumed thrush, no evidence of thrush or laryngitis on exam today - Gargle with salt water - Prescribe Pepcid 20 mg BID for reflux - diet and lifestyle changes to minimize reflux  - medical management of post-nasal drainage - Advise avoiding throat clearing; take small sips of water when the urge arises   Chronic Postnasal Drip Environmental Allergies  Postnasal drip contributing to throat irritation. Patient has been using Flonase and azelastine nasal sprays intermittently. No evidence of sinus infection on examination. Consistent daily use of nasal sprays is necessary for managing postnasal drip and reducing  throat irritation. - Refill Flonase and azelastine, use each daily 2 puffs b/l nares  - Use Flonase in the morning and azelastine at night - Prescribe Clarinex 5 mg QHS for presumed environmental allergies   Tinnitus Ringing in the ears, bilateral, buzzing sound. No recent hearing test. Tinnitus is often a sign of hearing changes. A hearing test can help determine if there are any changes in hearing that need to be addressed. - Order hearing test  Dry Mouth Complaints of dry mouth, especially in the morning. No evidence of dry mouth on examination. Advised to discuss with primary care provider for further evaluation and management. Certain medications might have a drying effect and should be reviewed with primary care provider. - Recommend lemon cough drops and increased water intake - Discuss potential medication side effects with primary care provider  Hashimoto's Thyroiditis - Continue current management with endocrinologist  Follow-up - Follow up with primary care provider for dry mouth and medication review - Schedule hearing test - will call with results     Thank you for allowing me to participate in the care of this patient. Please do not hesitate to contact me with any questions or concerns.   Ashok Croon, MD Otolaryngology [facility] Phone: [phone] Fax: [phone]    12/05/2023, 8:47 AM

## 2023-12-23 ENCOUNTER — Ambulatory Visit: Payer: Medicare Other | Admitting: Audiologist

## 2023-12-24 ENCOUNTER — Ambulatory Visit: Payer: Medicare Other

## 2023-12-31 ENCOUNTER — Ambulatory Visit: Payer: Medicare Other | Attending: Family Medicine

## 2023-12-31 ENCOUNTER — Other Ambulatory Visit: Payer: Self-pay

## 2023-12-31 DIAGNOSIS — M7751 Other enthesopathy of right foot: Secondary | ICD-10-CM | POA: Insufficient documentation

## 2023-12-31 DIAGNOSIS — R52 Pain, unspecified: Secondary | ICD-10-CM | POA: Diagnosis present

## 2023-12-31 DIAGNOSIS — M25571 Pain in right ankle and joints of right foot: Secondary | ICD-10-CM | POA: Insufficient documentation

## 2023-12-31 NOTE — Therapy (Signed)
 OUTPATIENT PHYSICAL THERAPY LOWER EXTREMITY EVALUATION   Patient Name: Jocelyn Parker MRN: [id number] DOB:May 28, 1951, 73 y.o., female Today's Date: 12/31/2023  END OF SESSION:    Past Medical History:  Diagnosis Date   Allergy    sulfa   Chronic interstitial cystitis    Lung cancer (HCC)    per patient    Thyroid disease    Past Surgical History:  Procedure Laterality Date   ABDOMINAL HYSTERECTOMY     lumps removed from breast     1970's ,1990's and in 2000's  nono cancer    LUNG SURGERY  04/05/2020   Patient Active Problem List   Diagnosis Date Noted   Throat pain 11/04/2023   Acute right ankle pain 07/28/2023   Coronary artery calcification 12/02/2022   Leg pain 11/12/2022   Familial hypercholesterolemia 02/12/2022   Celiac disease 07/17/2021   Impaired fasting glucose 07/17/2021   Vertigo 03/25/2021   Gastritis with intestinal metaplasia of stomach 01/10/2021   S/P partial lobectomy of lung 04/12/2020   Nodule of upper lobe of right lung 04/05/2020   Bilateral breast cysts 12/27/2019   Multiple cysts of breast 12/27/2019   Malignant neoplasm of upper lobe of right lung (HCC) 10/12/2019   Sensorineural hearing loss (SNHL) of both ears 06/20/2019   Adenocarcinoma of lung, stage 1, right (HCC) 05/26/2019   Tinnitus of both ears 05/12/2019   B12 deficiency 04/11/2019   Cyst of ovary 04/01/2019   Menopause syndrome 04/01/2019   Osteopenia 04/01/2019   Vitamin D deficiency 05/24/2015   Xanthoma of eyelid 12/21/2014   Cyst of kidney, acquired 12/20/2014   Interstitial cystitis (chronic) without hematuria 03/20/2012   Shortness of breath 08/16/2008   Nontoxic multinodular goiter 08/08/2007    PCP: Hillard Danker, MD  REFERRING PROVIDER: Clementeen Graham, MD  REFERRING DIAG: Chronic pain of R ankle  THERAPY DIAG:  No diagnosis found.  Rationale for Evaluation and Treatment: Rehabilitation  ONSET DATE: October 2024  SUBJECTIVE:   SUBJECTIVE  STATEMENT:[place]  Notes wore tennis shoes the entire time and did not have any problems with her ankle.  Noted since she has returned, wore shoes with heels once which caused her R ankle pain.  Also noted that she went for a walk for about 2 miles 2 days ago with the warmer weather and had her R ankle pain. If whe wears her figure 8 splint for walking, her ankle doesn't hurt. EVAL:Reports edema R lat ankle, some pain R ant /L ankle jt, primarily when she rests, much worse at night.  Had injection several months ago without much relief.  Noticed that it started after she rode a bike for 3-4 days in a row.  She used to walk 3 mi several times a week and has stopped walking with this new problem. Wears a figure 8 brace most days which feels good  PERTINENT HISTORY: See above, saw sports medicine MD and had diagnostic ultrasound, referred to PT for eval and treat PAIN:  Are you having pain? Yes: NPRS scale: 0-2 Pain location: R ant lat ankle jt  Pain description: deep ache, almost a numbness Aggravating factors: down steps, bicycling, prolonged walking Relieving factors: meds, brace/external support  PRECAUTIONS: None  RED FLAGS: None   WEIGHT BEARING RESTRICTIONS: No  FALLS:  Has patient fallen in last 6 months? No  LIVING ENVIRONMENT: Lives with: lives with their family Lives in: House/apartment Stairs: unknown Has following equipment at home: None  OCCUPATION: retired, travelling soon to [place] for 2 weeks   PLOF: Independent  PATIENT GOALS: get rid of the pain and maintain activity level  NEXT MD VISIT: 2 months  OBJECTIVE:  Note: Objective measures were completed at Evaluation unless otherwise noted.  DIAGNOSTIC FINDINGS: IMPRESSION: 1. Mild posterior tibial tenosynovitis. 2. Minimal peroneus longus and brevis tenosynovitis. 3. 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     Electronically Signed   By:  Neita Garnet M.D.   On: 10/24/2023 17:45  PATIENT SURVEYS:  FAAM Activities of Daily Living Subscale: 64 / 84 or 76 % function, 24% disability  COGNITION: Overall cognitive status: Within functional limits for tasks assessed     SENSATION: WFL  EDEMA:  Lateral malleolus mod edema  MUSCLE LENGTH: Hamstrings: Right wnl deg; Left wnl deg Maisie Fus test: Right nt deg; Left nt deg  POSTURE: lean, observation of B feet indicates normal less than 1 cm navicular drop B, L slighter greater than R  PALPATION: pt tender R ankle over R ant talocrual jt line Thickened and tender R peroneal musculature  LOWER EXTREMITY ROM: R ankle dorsiflexion 10, L ankle dorsiflexion 14 Plantarflexion, inv, ev all WNL B B hips, knees ROM wnl  LOWER EXTREMITY MMT: gross MMT wnl B LE's Able to heel and toe walk B  FUNCTIONAL TESTS:  Forward heel taps from 2 " step, B over 10 reps each, pain with L heel tap with weight bearing R ankle dorsiflexion  Unilateral standing, over 30 sec on each leg with minimal sway  GAIT: Distance walked: in clinic over 70', no device                                                                                                                          TREATMENT DATE:  12/31/23: R ankle dorsiflexion measured 11 degrees  Palpation hypersensitive over R ant talocrual jt/ ligaments. Also tender over R peroneals, brevis and longus in the muscle belly.  Rx of deep cross friction massage, myofascial release over mid portion peroneals.   Then instructed/ advanced therex to include eccentric eversion R ankle with yellow t band, advised to perform every other day, and for no pain R ankle jt during ex  Instructed as well in isometrics for R ankle eversion but dc'd as increased ant ankle/jt pain Also instructed in seated B heel squeezes of ball between heels   12/03/23: Evaluation,  Instructed in mobilization with movement, jt mobs R talonavicular jt with green t band , combined  with forward lunges.  Also instructed in and pt was able to replicate self cross friction massage R peroneal muscle belly with a cylinder, advised to use water bottle or rolling pin, etc at home.     PATIENT EDUCATION:  Education details: POC, goals Person educated: Patient Education method: Explanation, Demonstration, and Tactile cues Education comprehension: verbalized understanding, returned demonstration, and verbal cues required  HOME EXERCISE PROGRAM: See above  ASSESSMENT:  CLINICAL IMPRESSION:12/31/23: patient returns  today with ongoing sx but has a better idea of which activities contribute to her sx.  Advanced to eccentric training today of R peroneals.  Also education that she may benefit from consistently wearing her R ankle splint for 2 weeks to determine whether a rest period may provide some relief of this chronic problem. Will continue to assess and progress therex, other techniques as is appropriate.may benefit from TPDN R peroneals. Eval:Patient is a 73  y.o. female who was evaluated today by physical therapy due to chronic R lateral ankle pain, edema, tendinosis. She presents as very high level, had some loss of R ankle dorsiflexion ROM as compared to L, with some replication of Sx with stepping down L with wbing on R LE.  Also some tenderness R peroneals.  She is going on a 2 1/2 wk vacation so today's visit consisted of evaluation and instructions in techniques to use on her trip to improve her r ankle Sx.  Also advised her to go to local running shoe store for assessment of appropriate foot wear.  OBJECTIVE IMPAIRMENTS: decreased activity tolerance, decreased ROM, hypomobility, and pain.   ACTIVITY LIMITATIONS: sleeping, stairs, and locomotion level  PARTICIPATION LIMITATIONS: community activity and yard work  PERSONAL FACTORS: Behavior pattern, Time since onset of injury/illness/exacerbation, and 1 comorbidity: Hashimoto  are also affecting patient's functional  outcome.   REHAB POTENTIAL: Good  CLINICAL DECISION MAKING: Stable/uncomplicated  EVALUATION COMPLEXITY: Low   GOALS: Goals reviewed with patient? Yes   LONG TERM GOALS: Pt out of town for 3 weeks after evaluation, so lont term goals only: Target date: 01/28/24: 8 weeks   I HEP Baseline: initiated at eval Goal status: INITIAL  2.  Improve R ankle AROM for dorsiflexion to 14 degrees or greater for improved gait pattern , efficiency Baseline:  Goal status: INITIAL  3.  Patient able to complete 10 eccentric L heel taps from 4" step, forward , without R ankle pain Baseline:  Goal status: INITIAL  4.  FAAM Activities of Daily Living Subscale: 64 / 84 or 76 % function, 24% disability improve to 14 % disability for R ankle Baseline:  Goal status: INITIAL    PLAN:  PT FREQUENCY: 1x/week  PT DURATION: 8 weeks  PLANNED INTERVENTIONS: 97110-Therapeutic exercises, 97530- Therapeutic activity, [id number]- Neuromuscular re-education, 97535- Self Care, [address]- Manual therapy, and 97033- Ionotophoresis 4mg /ml Dexamethasone  PLAN FOR NEXT SESSION: assess flexibility, pain, utilize manual techniques, stretching, deep tissue release R peroneals with mass, myofascial release, TPDN? Progress with eccentric training R peroneals   Halley Shepheard L Serge Main, PT, DPT,OCS 12/31/2023, 2:42 PM
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
## 2024-01-07 ENCOUNTER — Ambulatory Visit: Payer: Medicare Other

## 2024-01-12 ENCOUNTER — Encounter: Payer: Self-pay | Admitting: Internal Medicine

## 2024-01-12 ENCOUNTER — Ambulatory Visit: Payer: Medicare Other | Attending: Internal Medicine | Admitting: Internal Medicine

## 2024-01-12 VITALS — BP 122/60 | HR 69 | Ht 63.5 in | Wt 119.0 lb

## 2024-01-12 DIAGNOSIS — I700 Atherosclerosis of aorta: Secondary | ICD-10-CM | POA: Insufficient documentation

## 2024-01-12 DIAGNOSIS — N182 Chronic kidney disease, stage 2 (mild): Secondary | ICD-10-CM | POA: Insufficient documentation

## 2024-01-12 DIAGNOSIS — E7801 Familial hypercholesterolemia: Secondary | ICD-10-CM | POA: Diagnosis not present

## 2024-01-12 DIAGNOSIS — I2510 Atherosclerotic heart disease of native coronary artery without angina pectoris: Secondary | ICD-10-CM | POA: Insufficient documentation

## 2024-01-12 DIAGNOSIS — I739 Peripheral vascular disease, unspecified: Secondary | ICD-10-CM | POA: Insufficient documentation

## 2024-01-12 MED ORDER — ASPIRIN 81 MG PO TBEC: 81.0000 mg | DELAYED_RELEASE_TABLET | Freq: Every day | ORAL | Status: AC

## 2024-01-12 NOTE — Patient Instructions (Signed)
 Medication Instructions:  Your physician has recommended you make the following change in your medication:   1) START aspirin 81 mg daily (you can purchase this over the counter)  *If you need a refill on your cardiac medications before your next appointment, please call your pharmacy*  Follow-Up: At [facility], you and your health needs are our priority.  As part of our continuing mission to provide you with exceptional heart care, we have created designated Provider Care Teams.  These Care Teams include your primary Cardiologist (physician) and Advanced Practice Providers (APPs -  Physician Assistants and Nurse Practitioners) who all work together to provide you with the care you need, when you need it.  Your next appointment:   6 month(s)  The format for your next appointment:   In Person  Provider:   Tereso Newcomer, PA-C    Other Instructions You have been referred to our Lipid Clinic, a scheduler will call you to schedule an appointment to meet with our Pharmacist.    1st Floor: - Lobby - Registration  - Pharmacy  - Lab - Cafe  2nd Floor: - PV Lab - Diagnostic Testing (echo, CT, nuclear med)  3rd Floor: - Vacant  4th Floor: - TCTS (cardiothoracic surgery) - AFib Clinic - Structural Heart Clinic - Vascular Surgery  - Vascular Ultrasound  5th Floor: - HeartCare Cardiology (general and EP) - Clinical Pharmacy for coumadin, hypertension, lipid, weight-loss medications, and med management appointments    Valet parking services will be available as well.

## 2024-01-14 ENCOUNTER — Other Ambulatory Visit: Payer: Self-pay

## 2024-01-14 ENCOUNTER — Ambulatory Visit: Payer: Medicare Other | Attending: Family Medicine

## 2024-01-14 DIAGNOSIS — H903 Sensorineural hearing loss, bilateral: Secondary | ICD-10-CM | POA: Insufficient documentation

## 2024-01-14 DIAGNOSIS — R52 Pain, unspecified: Secondary | ICD-10-CM | POA: Insufficient documentation

## 2024-01-14 DIAGNOSIS — M7751 Other enthesopathy of right foot: Secondary | ICD-10-CM | POA: Diagnosis present

## 2024-01-14 DIAGNOSIS — M25571 Pain in right ankle and joints of right foot: Secondary | ICD-10-CM | POA: Diagnosis present

## 2024-01-14 NOTE — Therapy (Signed)
 OUTPATIENT PHYSICAL THERAPY LOWER EXTREMITY TREATMENT   Patient Name: Jocelyn Parker MRN: [id number] DOB:08/11/1951, 73 y.o., female Today's Date: 01/14/2024  END OF SESSION:  PT End of Session - 01/14/24 1538     Visit Number 3    Date for PT Re-Evaluation 01/28/24    Progress Note Due on Visit 10    PT Start Time 1100    PT Stop Time 1140    PT Time Calculation (min) 40 min    Activity Tolerance Patient tolerated treatment well    Behavior During Therapy [facility] for tasks assessed/performed              Past Medical History:  Diagnosis Date   Allergy    sulfa   Chronic interstitial cystitis    Lung cancer (HCC)    per patient    Thyroid disease    Past Surgical History:  Procedure Laterality Date   ABDOMINAL HYSTERECTOMY     lumps removed from breast     1970's ,1990's and in 2000's  nono cancer    LUNG SURGERY  04/05/2020   Patient Active Problem List   Diagnosis Date Noted   Throat pain 11/04/2023   Acute right ankle pain 07/28/2023   Coronary artery calcification 12/02/2022   Leg pain 11/12/2022   Familial hypercholesterolemia 02/12/2022   Celiac disease 07/17/2021   Impaired fasting glucose 07/17/2021   Vertigo 03/25/2021   Gastritis with intestinal metaplasia of stomach 01/10/2021   S/P partial lobectomy of lung 04/12/2020   Nodule of upper lobe of right lung 04/05/2020   Bilateral breast cysts 12/27/2019   Multiple cysts of breast 12/27/2019   Malignant neoplasm of upper lobe of right lung (HCC) 10/12/2019   Sensorineural hearing loss (SNHL) of both ears 06/20/2019   Adenocarcinoma of lung, stage 1, right (HCC) 05/26/2019   Tinnitus of both ears 05/12/2019   B12 deficiency 04/11/2019   Cyst of ovary 04/01/2019   Menopause syndrome 04/01/2019   Osteopenia 04/01/2019   Vitamin D deficiency 05/24/2015   Xanthoma of eyelid 12/21/2014   Cyst of kidney, acquired 12/20/2014   Interstitial cystitis (chronic) without hematuria 03/20/2012   Shortness of  breath 08/16/2008   Nontoxic multinodular goiter 08/08/2007    PCP: Hillard Danker, MD  REFERRING PROVIDER: Clementeen Graham, MD  REFERRING DIAG: Chronic pain of R ankle  THERAPY DIAG:  Pain in right ankle and joints of right foot  Tendonitis of ankle, right  Pain aggravated by stair climbing  Rationale for Evaluation and Treatment: Rehabilitation  ONSET DATE: October 2024  SUBJECTIVE:   SUBJECTIVE STATEMENT:01/14/24: reports her husband has been assisting with deep massage R peroneals, also she is wearing R anle brace when walking.  Wore high heels this weekend for about 3-4 hrs and did not have her typical pain.  Is using the yellow band and performing the eversion strengthening every other day .  Not sure if she is feeling better as she is able to manage her pain better or if she is actually getting physically better.   EVAL:Reports edema R lat ankle, some pain R ant /L ankle jt, primarily when she rests, much worse at night.  Had injection several months ago without much relief.  Noticed that it started after she rode a bike for 3-4 days in a row.  She used to walk 3 mi several times a week and has stopped walking with this new problem. Wears a figure 8 brace most days which feels good  PERTINENT HISTORY: See  above, saw sports medicine MD and had diagnostic ultrasound, referred to PT for eval and treat PAIN:  Are you having pain? Yes: NPRS scale: 0-2 Pain location: R ant lat ankle jt  Pain description: deep ache, almost a numbness Aggravating factors: down steps, bicycling, prolonged walking Relieving factors: meds, brace/external support  PRECAUTIONS: None  RED FLAGS: None   WEIGHT BEARING RESTRICTIONS: No  FALLS:  Has patient fallen in last 6 months? No  LIVING ENVIRONMENT: Lives with: lives with their family Lives in: House/apartment Stairs: unknown Has following equipment at home: None  OCCUPATION: retired, travelling soon to [place] for 2 weeks   PLOF:  Independent  PATIENT GOALS: get rid of the pain and maintain activity level  NEXT MD VISIT: 2 months  OBJECTIVE:  Note: Objective measures were completed at Evaluation unless otherwise noted.  DIAGNOSTIC FINDINGS: IMPRESSION: 1. Mild posterior tibial tenosynovitis. 2. Minimal peroneus longus and brevis tenosynovitis. 3. 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     Electronically Signed   By: Neita Garnet M.D.   On: 10/24/2023 17:45  PATIENT SURVEYS:  FAAM Activities of Daily Living Subscale: 64 / 84 or 76 % function, 24% disability  COGNITION: Overall cognitive status: Within functional limits for tasks assessed     SENSATION: WFL  EDEMA:  Lateral malleolus mod edema  MUSCLE LENGTH: Hamstrings: Right wnl deg; Left wnl deg Maisie Fus test: Right nt deg; Left nt deg  POSTURE: lean, observation of B feet indicates normal less than 1 cm navicular drop B, L slighter greater than R  PALPATION: pt tender R ankle over R ant talocrual jt line Thickened and tender R peroneal musculature  LOWER EXTREMITY ROM: R ankle dorsiflexion 10, L ankle dorsiflexion 14 Plantarflexion, inv, ev all WNL B B hips, knees ROM wnl  LOWER EXTREMITY MMT: gross MMT wnl B LE's Able to heel and toe walk B  FUNCTIONAL TESTS:  Forward heel taps from 2 " step, B over 10 reps each, pain with L heel tap with weight bearing R ankle dorsiflexion  Unilateral standing, over 30 sec on each leg with minimal sway  GAIT: Distance walked: in clinic over 70', no device                                                                                                                          TREATMENT DATE:  01/14/24:  Palpation of R distal lat malleolus is still quite point tender.  No pain with resisted burst testing R ankle inv, ev, pflex, doflex Side lying on L with R lower leg on foam roller, for cross friction massage and myofascial release R peroneal musculature  L side lying for R  ankle eversion with yellow t band, , able to perform 10 reps easily, advanced to red t band and pt performed 10 reps, then developed pain R ant/lat ankle, therefore recommended continuing with yellow Seated for ball squeezes between heels for seated B heel raises to engage peroneals for  stabilization  12/31/23: R ankle dorsiflexion measured 11 degrees  Palpation hypersensitive over R ant talocrual jt/ ligaments. Also tender over R peroneals, brevis and longus in the muscle belly.  Rx of deep cross friction massage, myofascial release over mid portion peroneals.   Then instructed/ advanced therex to include eccentric eversion R ankle with yellow t band, advised to perform every other day, and for no pain R ankle jt during ex  Instructed as well in isometrics for R ankle eversion but dc'd as increased ant ankle/jt pain Also instructed in seated B heel squeezes of ball between heels   12/03/23: Evaluation,  Instructed in mobilization with movement, jt mobs R talonavicular jt with green t band , combined with forward lunges.  Also instructed in and pt was able to replicate self cross friction massage R peroneal muscle belly with a cylinder, advised to use water bottle or rolling pin, etc at home.     PATIENT EDUCATION:  Education details: POC, goals Person educated: Patient Education method: Explanation, Demonstration, and Tactile cues Education comprehension: verbalized understanding, returned demonstration, and verbal cues required  HOME EXERCISE PROGRAM: See above  ASSESSMENT:  CLINICAL IMPRESSION:3/12//25: patient returns today with ongoing sx R lateral ankle but improved, unsure whether improved as she is more diligent about wearing theankle brace, and husband massaging R peroneals, or if she is getting stronger.  We discussed TPDn for peroneals and she had a negative experience , lots of pain with TPDN of her neck a few years ago so did not pursue today.  We scheduled one more visit  in which we may try TPDN again , and for pt to continue with the therex, and use of splint to determine whther she continues to improve.  Will continue to assess and progress therex, other techniques as is appropriate.may benefit from TPDN R peroneals. Eval:Patient is a 73  y.o. female who was evaluated today by physical therapy due to chronic R lateral ankle pain, edema, tendinosis. She presents as very high level, had some loss of R ankle dorsiflexion ROM as compared to L, with some replication of Sx with stepping down L with wbing on R LE.  Also some tenderness R peroneals.  She is going on a 2 1/2 wk vacation so today's visit consisted of evaluation and instructions in techniques to use on her trip to improve her r ankle Sx.  Also advised her to go to local running shoe store for assessment of appropriate foot wear.  OBJECTIVE IMPAIRMENTS: decreased activity tolerance, decreased ROM, hypomobility, and pain.   ACTIVITY LIMITATIONS: sleeping, stairs, and locomotion level  PARTICIPATION LIMITATIONS: community activity and yard work  PERSONAL FACTORS: Behavior pattern, Time since onset of injury/illness/exacerbation, and 1 comorbidity: Hashimoto  are also affecting patient's functional outcome.   REHAB POTENTIAL: Good  CLINICAL DECISION MAKING: Stable/uncomplicated  EVALUATION COMPLEXITY: Low   GOALS: Goals reviewed with patient? Yes   LONG TERM GOALS: Pt out of town for 3 weeks after evaluation, so lont term goals only: Target date: 01/28/24: 8 weeks   I HEP Baseline: initiated at eval Goal status: INITIAL  2.  Improve R ankle AROM for dorsiflexion to 14 degrees or greater for improved gait pattern , efficiency Baseline:  Goal status: INITIAL  3.  Patient able to complete 10 eccentric L heel taps from 4" step, forward , without R ankle pain Baseline:  Goal status: INITIAL  4.  FAAM Activities of Daily Living Subscale: 64 / 84 or 76 % function, 24% disability  improve to 14 %  disability for R ankle Baseline:  Goal status: INITIAL    PLAN:  PT FREQUENCY: 1x/week  PT DURATION: 8 weeks  PLANNED INTERVENTIONS: 97110-Therapeutic exercises, 97530- Therapeutic activity, [id number]- Neuromuscular re-education, 97535- Self Care, [id number]- Manual therapy, and 97033- Ionotophoresis 4mg /ml Dexamethasone  PLAN FOR NEXT SESSION: assess flexibility, pain, utilize manual techniques, stretching, deep tissue release R peroneals with mass, myofascial release, TPDN? Progress with eccentric training R peroneals   Selestino Nila L Michaelanthony Kempton, PT, DPT,OCS 01/14/2024, 3:39 PM

## 2024-01-21 ENCOUNTER — Ambulatory Visit: Payer: Medicare Other | Admitting: Audiologist

## 2024-01-21 DIAGNOSIS — H903 Sensorineural hearing loss, bilateral: Secondary | ICD-10-CM

## 2024-01-21 DIAGNOSIS — M25571 Pain in right ankle and joints of right foot: Secondary | ICD-10-CM | POA: Diagnosis not present

## 2024-01-21 NOTE — Procedures (Signed)
  Outpatient Audiology and [facility] [address] [place], [place]  [address] [phone]  AUDIOLOGICAL  EVALUATION  NAME: Jocelyn Parker     DOB:   January 21, 1951      MRN: [id number]                                                                                     DATE: 01/21/2024     REFERENT: Myrlene Broker, MD STATUS: Outpatient DIAGNOSIS: Tinnitus, Sensorineural Hearing Loss   History: Ludie was seen for an audiological evaluation due to ringing in her ears. The ringing has been present for many years. At first it was bothersome but she has learned to live with it. She cannot hear the TV as clearly as she used to, but otherwise denies any difficulty hearing.  Nevae denies pain or pressure in either ear. Anayansi no history of hazardous noise exposure.  Medical history shows no additional risk for hearing loss.    Evaluation:  Otoscopy showed a clear view of the tympanic membranes, bilaterally Tympanometry results were consistent with normal middle ear function, bilaterally   Audiometric testing was completed using Conventional Audiometry techniques with insert earphones and supraural headphones. Test results are consistent with mild sloping to moderate sensorineural hearing loss. Speech Recognition Thresholds were obtained at 35 dB HL in the right ear and at 30 dB HL in the left ear. Word Recognition Testing was completed at  40dB SL and Adonna scored 96% in the right ear and 100% in the left ear.    Results:  The test results were reviewed with Malachi Bonds. She has mild sensorineural hearing loss in both ears. The last pitch drops into a moderate loss bilaterally. The tinnitus is most likely from the hearing loss. Management strategies discussed, but over all she does not find it bothersome. She is not a hearing aid candidate at this time.  Audiogram printed and provided to [place].    Recommendations: Annual audiometric testing recommended to monitor hearing  loss for progression.   28 minutes spent testing and counseling on results.   If you have any questions please feel free to contact me at [phone].  Ammie Ferrier Stalnaker Au.D.  Audiologist   01/21/2024  10:10 AM  Cc: Myrlene Broker, MD

## 2024-01-28 ENCOUNTER — Ambulatory Visit

## 2024-01-28 ENCOUNTER — Other Ambulatory Visit: Payer: Self-pay

## 2024-01-28 DIAGNOSIS — M7751 Other enthesopathy of right foot: Secondary | ICD-10-CM

## 2024-01-28 DIAGNOSIS — R52 Pain, unspecified: Secondary | ICD-10-CM

## 2024-01-28 DIAGNOSIS — M25571 Pain in right ankle and joints of right foot: Secondary | ICD-10-CM

## 2024-01-28 NOTE — Therapy (Unsigned)
 OUTPATIENT PHYSICAL THERAPY LOWER EXTREMITY TREATMENT   Patient Name: Jocelyn Parker MRN: [id number] DOB:[birth year], 73 y.o., female Today's Date: 01/29/2024  END OF SESSION:  PT End of Session - 01/28/24 1435     Visit Number 4    Date for PT Re-Evaluation 01/28/24    Progress Note Due on Visit 10    PT Start Time 1435    PT Stop Time 1515    PT Time Calculation (min) 40 min    Activity Tolerance Patient tolerated treatment well    Behavior During Therapy WFL for tasks assessed/performed               Past Medical History:  Diagnosis Date   Allergy    sulfa   Chronic interstitial cystitis    Lung cancer (HCC)    per patient    Thyroid disease    Past Surgical History:  Procedure Laterality Date   ABDOMINAL HYSTERECTOMY     lumps removed from breast     1970's ,1990's and in 2000's  nono cancer    LUNG SURGERY  04/05/2020   Patient Active Problem List   Diagnosis Date Noted   Throat pain 11/04/2023   Acute right ankle pain 07/28/2023   Coronary artery calcification 12/02/2022   Leg pain 11/12/2022   Familial hypercholesterolemia 02/12/2022   Celiac disease 07/17/2021   Impaired fasting glucose 07/17/2021   Vertigo 03/25/2021   Gastritis with intestinal metaplasia of stomach 01/10/2021   S/P partial lobectomy of lung 04/12/2020   Nodule of upper lobe of right lung 04/05/2020   Bilateral breast cysts 12/27/2019   Multiple cysts of breast 12/27/2019   Malignant neoplasm of upper lobe of right lung (HCC) 10/12/2019   Sensorineural hearing loss (SNHL) of both ears 06/20/2019   Adenocarcinoma of lung, stage 1, right (HCC) 05/26/2019   Tinnitus of both ears 05/12/2019   B12 deficiency 04/11/2019   Cyst of ovary 04/01/2019   Menopause syndrome 04/01/2019   Osteopenia 04/01/2019   Vitamin D deficiency 05/24/2015   Xanthoma of eyelid 12/21/2014   Cyst of kidney, acquired 12/20/2014   Interstitial cystitis (chronic) without hematuria 03/20/2012   Shortness of  breath 08/16/2008   Nontoxic multinodular goiter 08/08/2007    PCP: Hillard Danker, MD  REFERRING PROVIDER: Clementeen Graham, MD  REFERRING DIAG: Chronic pain of R ankle  THERAPY DIAG:  Pain in right ankle and joints of right foot  Tendonitis of ankle, right  Pain aggravated by stair climbing  Rationale for Evaluation and Treatment: Rehabilitation  ONSET DATE: October 2024  SUBJECTIVE:   SUBJECTIVE STATEMENT:01/28/24: walking 2-3 x week, about 2 miles, wears brace to walk, takes brace off during the day after walking.  Notices a burning sensation R lateral lower leg , no tingling or numbness, just notices when she is still , sitting and lying down 01/14/24: reports her husband has been assisting with deep massage R peroneals, also she is wearing R anle brace when walking.  Wore high heels this weekend for about 3-4 hrs and did not have her typical pain.  Is using the yellow band and performing the eversion strengthening every other day .  Not sure if she is feeling better as she is able to manage her pain better or if she is actually getting physically better.   EVAL:Reports edema R lat ankle, some pain R ant /L ankle jt, primarily when she rests, much worse at night.  Had injection several months ago without much relief.  Noticed that  it started after she rode a bike for 3-4 days in a row.  She used to walk 3 mi several times a week and has stopped walking with this new problem. Wears a figure 8 brace most days which feels good  PERTINENT HISTORY: See above, saw sports medicine MD and had diagnostic ultrasound, referred to PT for eval and treat PAIN:  Are you having pain? Yes: NPRS scale: 0-2 Pain location: R ant lat ankle jt  Pain description: deep ache, almost a numbness Aggravating factors: down steps, bicycling, prolonged walking Relieving factors: meds, brace/external support  PRECAUTIONS: None  RED FLAGS: None   WEIGHT BEARING RESTRICTIONS: No  FALLS:  Has patient  fallen in last 6 months? No  LIVING ENVIRONMENT: Lives with: lives with their family Lives in: House/apartment Stairs: unknown Has following equipment at home: None  OCCUPATION: retired, travelling soon to [place] for 2 weeks   PLOF: Independent  PATIENT GOALS: get rid of the pain and maintain activity level  NEXT MD VISIT: 2 months  OBJECTIVE:  Note: Objective measures were completed at Evaluation unless otherwise noted.  DIAGNOSTIC FINDINGS: IMPRESSION: 1. Mild posterior tibial tenosynovitis. 2. Minimal peroneus longus and brevis tenosynovitis. 3. 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     Electronically Signed   By: Neita Garnet M.D.   On: 10/24/2023 17:45  PATIENT SURVEYS:  FAAM Activities of Daily Living Subscale: 64 / 84 or 76 % function, 24% disability  COGNITION: Overall cognitive status: Within functional limits for tasks assessed     SENSATION: WFL  EDEMA:  Lateral malleolus mod edema  MUSCLE LENGTH: Hamstrings: Right wnl deg; Left wnl deg Maisie Fus test: Right nt deg; Left nt deg  POSTURE: lean, observation of B feet indicates normal less than 1 cm navicular drop B, L slighter greater than R  PALPATION: pt tender R ankle over R ant talocrual jt line Thickened and tender R peroneal musculature  LOWER EXTREMITY ROM: R ankle dorsiflexion 10, L ankle dorsiflexion 14 Plantarflexion, inv, ev all WNL B B hips, knees ROM wnl  LOWER EXTREMITY MMT: gross MMT wnl B LE's Able to heel and toe walk B  FUNCTIONAL TESTS:  Forward heel taps from 2 " step, B over 10 reps each, pain with L heel tap with weight bearing R ankle dorsiflexion  Unilateral standing, over 30 sec on each leg with minimal sway  GAIT: Distance walked: in clinic over 70', no device                                                                                                                          TREATMENT DATE:  01/29/24: Self care/ management:  Palpation  of R lateral lower leg with pt tenderness specifically over R peroneus brevis tendon, did not improve today with cross friction massage Added kinesiotaping R ankle, 2 pieces extending from R plantar foot over tarsal bones, parallel to one another, extending laterally to fibular head, applied to provide additional support  to peroneal tendons.  Spent time with app showing the pt the anatomy of bone and musculature R lat lower leg, specifically peroneals,also pictoral references for compression sleeves for lower leg, which may benefit her if the kinesiotaping is relieving.   Discussed utilizing TPDN but pt deferred, fearful of being too sore for her traveling this weekend.   01/14/24:  Palpation of R distal lat malleolus is still quite point tender.  No pain with resisted burst testing R ankle inv, ev, pflex, doflex Side lying on L with R lower leg on foam roller, for cross friction massage and myofascial release R peroneal musculature  L side lying for R ankle eversion with yellow t band, , able to perform 10 reps easily, advanced to red t band and pt performed 10 reps, then developed pain R ant/lat ankle, therefore recommended continuing with yellow Seated for ball squeezes between heels for seated B heel raises to engage peroneals for stabilization  12/31/23: R ankle dorsiflexion measured 11 degrees  Palpation hypersensitive over R ant talocrual jt/ ligaments. Also tender over R peroneals, brevis and longus in the muscle belly.  Rx of deep cross friction massage, myofascial release over mid portion peroneals.   Then instructed/ advanced therex to include eccentric eversion R ankle with yellow t band, advised to perform every other day, and for no pain R ankle jt during ex  Instructed as well in isometrics for R ankle eversion but dc'd as increased ant ankle/jt pain Also instructed in seated B heel squeezes of ball between heels   12/03/23: Evaluation,  Instructed in mobilization with movement,  jt mobs R talonavicular jt with green t band , combined with forward lunges.  Also instructed in and pt was able to replicate self cross friction massage R peroneal muscle belly with a cylinder, advised to use water bottle or rolling pin, etc at home.     PATIENT EDUCATION:  Education details: POC, goals Person educated: Patient Education method: Explanation, Demonstration, and Tactile cues Education comprehension: verbalized understanding, returned demonstration, and verbal cues required  HOME EXERCISE PROGRAM: See above  ASSESSMENT:  CLINICAL IMPRESSION:01/28/24: patient returns today with ongoing sx R lateral lower leg over peroneus brevis muscle,  but improved R lat ankle jt.  Today was primarily spent in education with pt regarding further self care/ management of ongoing peroneal pain.  Recommended that if sx persist that she should return to Dr Denyse Amass.  She may try a lower leg sleeve if the kinesiotaping helps.  Will place on hol at this time.   Eval:Patient is a 73  y.o. female who was evaluated today by physical therapy due to chronic R lateral ankle pain, edema, tendinosis. She presents as very high level, had some loss of R ankle dorsiflexion ROM as compared to L, with some replication of Sx with stepping down L with wbing on R LE.  Also some tenderness R peroneals.  She is going on a 2 1/2 wk vacation so today's visit consisted of evaluation and instructions in techniques to use on her trip to improve her r ankle Sx.  Also advised her to go to local running shoe store for assessment of appropriate foot wear.  OBJECTIVE IMPAIRMENTS: decreased activity tolerance, decreased ROM, hypomobility, and pain.   ACTIVITY LIMITATIONS: sleeping, stairs, and locomotion level  PARTICIPATION LIMITATIONS: community activity and yard work  PERSONAL FACTORS: Behavior pattern, Time since onset of injury/illness/exacerbation, and 1 comorbidity: Hashimoto  are also affecting patient's functional  outcome.   REHAB POTENTIAL:  Good  CLINICAL DECISION MAKING: Stable/uncomplicated  EVALUATION COMPLEXITY: Low   GOALS: Goals reviewed with patient? Yes   LONG TERM GOALS: Pt out of town for 3 weeks after evaluation, so lont term goals only: Target date: 01/28/24: 8 weeks   I HEP Baseline: initiated at eval Goal status: met  2.  Improve R ankle AROM for dorsiflexion to 14 degrees or greater for improved gait pattern , efficiency Baseline:  Goal status: met  3.  Patient able to complete 10 eccentric L heel taps from 4" step, forward , without R ankle pain Baseline:  Goal status: INITIAL  4.  FAAM Activities of Daily Living Subscale: 64 / 84 or 76 % function, 24% disability improve to 14 % disability for R ankle Baseline:  Goal status: 01/28/24: 15% improved    PLAN:  PT FREQUENCY: 1x/week  PT DURATION: 8 weeks  PLANNED INTERVENTIONS: 97110-Therapeutic exercises, 97530- Therapeutic activity, 97112- Neuromuscular re-education, 97535- Self Care, 78469- Manual therapy, and 97033- Ionotophoresis 4mg /ml Dexamethasone  PLAN FOR NEXT SESSION: assess flexibility, pain, utilize manual techniques, stretching, deep tissue release R peroneals with mass, myofascial release, TPDN? Progress with eccentric training R peroneals   Facundo Allemand L Byard Carranza, PT, DPT,OCS 01/29/2024, 3:00 PM

## 2024-02-06 ENCOUNTER — Ambulatory Visit: Attending: Family Medicine | Admitting: Physical Therapy

## 2024-02-06 DIAGNOSIS — M25571 Pain in right ankle and joints of right foot: Secondary | ICD-10-CM | POA: Insufficient documentation

## 2024-02-06 DIAGNOSIS — M7751 Other enthesopathy of right foot: Secondary | ICD-10-CM | POA: Diagnosis present

## 2024-02-06 DIAGNOSIS — R52 Pain, unspecified: Secondary | ICD-10-CM | POA: Insufficient documentation

## 2024-02-06 NOTE — Therapy (Signed)
 OUTPATIENT PHYSICAL THERAPY LOWER EXTREMITY TREATMENT   Patient Name: Jocelyn Parker MRN: [id number] DOB:October 18, 1951, 73 y.o., female Today's Date: 02/06/2024  END OF SESSION:  PT End of Session - 02/06/24 1019     Visit Number 5    Date for PT Re-Evaluation 01/28/24    Progress Note Due on Visit 10    PT Start Time 1019    PT Stop Time 1050    PT Time Calculation (min) 31 min    Activity Tolerance Patient tolerated treatment well    Behavior During Therapy WFL for tasks assessed/performed              Past Medical History:  Diagnosis Date   Allergy    sulfa   Chronic interstitial cystitis    Lung cancer (HCC)    per patient    Thyroid disease    Past Surgical History:  Procedure Laterality Date   ABDOMINAL HYSTERECTOMY     lumps removed from breast     1970's ,1990's and in 2000's  nono cancer    LUNG SURGERY  04/05/2020   Patient Active Problem List   Diagnosis Date Noted   Throat pain 11/04/2023   Acute right ankle pain 07/28/2023   Coronary artery calcification 12/02/2022   Leg pain 11/12/2022   Familial hypercholesterolemia 02/12/2022   Celiac disease 07/17/2021   Impaired fasting glucose 07/17/2021   Vertigo 03/25/2021   Gastritis with intestinal metaplasia of stomach 01/10/2021   S/P partial lobectomy of lung 04/12/2020   Nodule of upper lobe of right lung 04/05/2020   Bilateral breast cysts 12/27/2019   Multiple cysts of breast 12/27/2019   Malignant neoplasm of upper lobe of right lung (HCC) 10/12/2019   Sensorineural hearing loss (SNHL) of both ears 06/20/2019   Adenocarcinoma of lung, stage 1, right (HCC) 05/26/2019   Tinnitus of both ears 05/12/2019   B12 deficiency 04/11/2019   Cyst of ovary 04/01/2019   Menopause syndrome 04/01/2019   Osteopenia 04/01/2019   Vitamin D deficiency 05/24/2015   Xanthoma of eyelid 12/21/2014   Cyst of kidney, acquired 12/20/2014   Interstitial cystitis (chronic) without hematuria 03/20/2012   Shortness of  breath 08/16/2008   Nontoxic multinodular goiter 08/08/2007    PCP: Hillard Danker, MD  REFERRING PROVIDER: Clementeen Graham, MD  REFERRING DIAG: Chronic pain of R ankle  THERAPY DIAG:  Pain in right ankle and joints of right foot  Tendonitis of ankle, right  Pain aggravated by stair climbing  Rationale for Evaluation and Treatment: Rehabilitation  ONSET DATE: October 2024  SUBJECTIVE:   SUBJECTIVE STATEMENT: 02/06/24: Pt states she is interested in more taping. Does feel the manual massage has been helping. Pt feels that the pain has been well controlled  01/28/24:walking 2-3 x week, about 2 miles, wears brace to walk, takes brace off during the day after walking.  Notices a burning sensation R lateral lower leg , no tingling or numbness, just notices when she is still , sitting and lying down 01/14/24: reports her husband has been assisting with deep massage R peroneals, also she is wearing R anle brace when walking.  Wore high heels this weekend for about 3-4 hrs and did not have her typical pain.  Is using the yellow band and performing the eversion strengthening every other day .  Not sure if she is feeling better as she is able to manage her pain better or if she is actually getting physically better.   EVAL:Reports edema R lat ankle, some  pain R ant /L ankle jt, primarily when she rests, much worse at night.  Had injection several months ago without much relief.  Noticed that it started after she rode a bike for 3-4 days in a row.  She used to walk 3 mi several times a week and has stopped walking with this new problem. Wears a figure 8 brace most days which feels good  PERTINENT HISTORY: See above, saw sports medicine MD and had diagnostic ultrasound, referred to PT for eval and treat PAIN:  Are you having pain? Yes: NPRS scale: 0-2 Pain location: R ant lat ankle jt  Pain description: deep ache, almost a numbness Aggravating factors: down steps, bicycling, prolonged  walking Relieving factors: meds, brace/external support  PRECAUTIONS: None  RED FLAGS: None   WEIGHT BEARING RESTRICTIONS: No  FALLS:  Has patient fallen in last 6 months? No  LIVING ENVIRONMENT: Lives with: lives with their family Lives in: House/apartment Stairs: unknown Has following equipment at home: None  OCCUPATION: retired, travelling soon to [place] for 2 weeks   PLOF: Independent  PATIENT GOALS: get rid of the pain and maintain activity level  NEXT MD VISIT: 2 months  OBJECTIVE:  Note: Objective measures were completed at Evaluation unless otherwise noted.  DIAGNOSTIC FINDINGS: IMPRESSION: 1. Mild posterior tibial tenosynovitis. 2. Minimal peroneus longus and brevis tenosynovitis. 3. 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     Electronically Signed   By: Neita Garnet M.D.   On: 10/24/2023 17:45  PATIENT SURVEYS:  FAAM Activities of Daily Living Subscale: 64 / 84 or 76 % function, 24% disability  COGNITION: Overall cognitive status: Within functional limits for tasks assessed     SENSATION: WFL  EDEMA:  Lateral malleolus mod edema  MUSCLE LENGTH: Hamstrings: Right wnl deg; Left wnl deg Maisie Fus test: Right nt deg; Left nt deg  POSTURE: lean, observation of B feet indicates normal less than 1 cm navicular drop B, L slighter greater than R  PALPATION: pt tender R ankle over R ant talocrual jt line Thickened and tender R peroneal musculature  LOWER EXTREMITY ROM: R ankle dorsiflexion 10, L ankle dorsiflexion 14 Plantarflexion, inv, ev all WNL B B hips, knees ROM wnl  LOWER EXTREMITY MMT: gross MMT wnl B LE's Able to heel and toe walk B  FUNCTIONAL TESTS:  Forward heel taps from 2 " step, B over 10 reps each, pain with L heel tap with weight bearing R ankle dorsiflexion  Unilateral standing, over 30 sec on each leg with minimal sway  GAIT: Distance walked: in clinic over 70', no device                                                                                                                           TREATMENT DATE:  02/06/24 Manual therapy STM & TPR peroneals, soleus, gastroc  Neuromuscular re-ed kinesiotaping R ankle, 2 pieces extending from R plantar foot over tarsal bones, parallel to one another, extending  laterally to fibular head, applied to provide additional support to peroneal tendons   01/29/24: Self care/ management:  Palpation of R lateral lower leg with pt tenderness specifically over R peroneus brevis tendon, did not improve today with cross friction massage Added kinesiotaping R ankle, 2 pieces extending from R plantar foot over tarsal bones, parallel to one another, extending laterally to fibular head, applied to provide additional support to peroneal tendons.  Spent time with app showing the pt the anatomy of bone and musculature R lat lower leg, specifically peroneals,also pictoral references for compression sleeves for lower leg, which may benefit her if the kinesiotaping is relieving.   Discussed utilizing TPDN but pt deferred, fearful of being too sore for her traveling this weekend.   01/14/24:  Palpation of R distal lat malleolus is still quite point tender.  No pain with resisted burst testing R ankle inv, ev, pflex, doflex Side lying on L with R lower leg on foam roller, for cross friction massage and myofascial release R peroneal musculature  L side lying for R ankle eversion with yellow t band, , able to perform 10 reps easily, advanced to red t band and pt performed 10 reps, then developed pain R ant/lat ankle, therefore recommended continuing with yellow Seated for ball squeezes between heels for seated B heel raises to engage peroneals for stabilization  12/31/23: R ankle dorsiflexion measured 11 degrees  Palpation hypersensitive over R ant talocrual jt/ ligaments. Also tender over R peroneals, brevis and longus in the muscle belly.  Rx of deep cross friction massage,  myofascial release over mid portion peroneals.   Then instructed/ advanced therex to include eccentric eversion R ankle with yellow t band, advised to perform every other day, and for no pain R ankle jt during ex  Instructed as well in isometrics for R ankle eversion but dc'd as increased ant ankle/jt pain Also instructed in seated B heel squeezes of ball between heels   12/03/23: Evaluation,  Instructed in mobilization with movement, jt mobs R talonavicular jt with green t band , combined with forward lunges.  Also instructed in and pt was able to replicate self cross friction massage R peroneal muscle belly with a cylinder, advised to use water bottle or rolling pin, etc at home.     PATIENT EDUCATION:  Education details: POC, goals Person educated: Patient Education method: Explanation, Demonstration, and Tactile cues Education comprehension: verbalized understanding, returned demonstration, and verbal cues required  HOME EXERCISE PROGRAM: See above  ASSESSMENT:  CLINICAL IMPRESSION: 02/06/24: Pt interested in continued manual work and k-tape. Provided support for R peroneals.   Eval:Patient is a 73  y.o. female who was evaluated today by physical therapy due to chronic R lateral ankle pain, edema, tendinosis. She presents as very high level, had some loss of R ankle dorsiflexion ROM as compared to L, with some replication of Sx with stepping down L with wbing on R LE.  Also some tenderness R peroneals.  She is going on a 2 1/2 wk vacation so today's visit consisted of evaluation and instructions in techniques to use on her trip to improve her r ankle Sx.  Also advised her to go to local running shoe store for assessment of appropriate foot wear.  OBJECTIVE IMPAIRMENTS: decreased activity tolerance, decreased ROM, hypomobility, and pain.   ACTIVITY LIMITATIONS: sleeping, stairs, and locomotion level  PARTICIPATION LIMITATIONS: community activity and yard work  PERSONAL FACTORS:  Behavior pattern, Time since onset of injury/illness/exacerbation, and 1 comorbidity:  Hashimoto  are also affecting patient's functional outcome.   REHAB POTENTIAL: Good  CLINICAL DECISION MAKING: Stable/uncomplicated  EVALUATION COMPLEXITY: Low   GOALS: Goals reviewed with patient? Yes   LONG TERM GOALS: Pt out of town for 3 weeks after evaluation, so lont term goals only: Target date: 01/28/24: 8 weeks   I HEP Baseline: initiated at eval Goal status: met  2.  Improve R ankle AROM for dorsiflexion to 14 degrees or greater for improved gait pattern , efficiency Baseline:  Goal status: met  3.  Patient able to complete 10 eccentric L heel taps from 4" step, forward , without R ankle pain Baseline:  Goal status: INITIAL  4.  FAAM Activities of Daily Living Subscale: 64 / 84 or 76 % function, 24% disability improve to 14 % disability for R ankle Baseline:  Goal status: 01/28/24: 15% improved    PLAN:  PT FREQUENCY: 1x/week  PT DURATION: 8 weeks  PLANNED INTERVENTIONS: 97110-Therapeutic exercises, 97530- Therapeutic activity, 97112- Neuromuscular re-education, 97535- Self Care, 16109- Manual therapy, and 97033- Ionotophoresis 4mg /ml Dexamethasone  PLAN FOR NEXT SESSION: assess flexibility, pain, utilize manual techniques, stretching, deep tissue release R peroneals with mass, myofascial release, TPDN? Progress with eccentric training R peroneals   Nollie Shiflett April Ma L Beechwood, PT, DPT 02/06/2024, 10:53 AM

## 2024-02-11 ENCOUNTER — Ambulatory Visit: Payer: Medicare Other | Admitting: Podiatry

## 2024-02-11 ENCOUNTER — Telehealth (INDEPENDENT_AMBULATORY_CARE_PROVIDER_SITE_OTHER): Admitting: Family Medicine

## 2024-02-11 ENCOUNTER — Encounter: Payer: Self-pay | Admitting: Family Medicine

## 2024-02-11 DIAGNOSIS — B9689 Other specified bacterial agents as the cause of diseases classified elsewhere: Secondary | ICD-10-CM

## 2024-02-11 DIAGNOSIS — J329 Chronic sinusitis, unspecified: Secondary | ICD-10-CM

## 2024-02-11 DIAGNOSIS — R0981 Nasal congestion: Secondary | ICD-10-CM | POA: Diagnosis not present

## 2024-02-11 MED ORDER — AZELASTINE HCL 0.1 % NA SOLN
2.0000 | Freq: Every day | NASAL | 12 refills | Status: AC
Start: 2024-02-11 — End: ?

## 2024-02-11 MED ORDER — AMOXICILLIN-POT CLAVULANATE 875-125 MG PO TABS: 1.0000 | ORAL_TABLET | Freq: Two times a day (BID) | ORAL | 0 refills | Status: DC

## 2024-02-11 NOTE — Progress Notes (Signed)
 Virtual Visit via Video Note  I connected with Jocelyn Parker on 02/11/24 at  9:20 AM EDT by a video enabled telemedicine application and verified that I am speaking with the correct person using two identifiers.  Patient Location: Home Provider Location: Office/Clinic  I discussed the limitations, risks, security, and privacy concerns of performing an evaluation and management service by video and the availability of in person appointments. I also discussed with the patient that there may be a patient responsible charge related to this service. The patient expressed understanding and agreed to proceed.  Subjective: PCP: Myrlene Broker, MD  No chief complaint on file.  HPI  73 year old female presents for A/V visit for evaluation of nasal congestion for the last 10 days with purulent nasal discharge, maxillary sinus pain and pressure. Has been using Claritin, nasal spray, Mucinex with mild temporary relief. Denies known sick contacts. Denies abdominal pain, nausea, vomiting, diarrhea, rash, fever, chills, other symptoms. Denies other concerns today. Medical history as outlined below.  ROS: Per HPI  Current Outpatient Medications:    amoxicillin-clavulanate (AUGMENTIN) 875-125 MG tablet, Take 1 tablet by mouth 2 (two) times daily., Disp: 20 tablet, Rfl: 0   aspirin EC 81 MG tablet, Take 1 tablet (81 mg total) by mouth daily., Disp: , Rfl:    azelastine (ASTELIN) 0.1 % nasal spray, Place 2 sprays into both nostrils daily. Use in each nostril as directed, Disp: 30 mL, Rfl: 12   calcium carbonate (TUMS - DOSED IN MG ELEMENTAL CALCIUM) 500 MG chewable tablet, 1 tablet, Disp: , Rfl:    Cholecalciferol (VITAMIN D3) 125 MCG (5000 UT) CAPS, Take 1 capsule (5,000 Units total) by mouth daily., Disp: 30 capsule, Rfl: 6   cyanocobalamin (VITAMIN B12) 1000 MCG/ML injection, Inject 1 mL (1,000 mcg total) into the muscle every 21 ( twenty-one) days., Disp: 10 mL, Rfl: 3   desloratadine  (CLARINEX) 5 MG tablet, Take 1 tablet (5 mg total) by mouth daily., Disp: 90 tablet, Rfl: 3   estradiol (VIVELLE-DOT) 0.1 MG/24HR, Place 1 patch (0.1 mg total) onto the skin 2 (two) times a week., Disp: 8 patch, Rfl: 12   famotidine (PEPCID) 20 MG tablet, Take 1 tablet (20 mg total) by mouth 2 (two) times daily., Disp: 30 tablet, Rfl: 3   fluticasone (FLONASE) 50 MCG/ACT nasal spray, Place 2 sprays into both nostrils daily., Disp: 16 g, Rfl: 6   ibuprofen (ADVIL) 800 MG tablet, Take 1 tablet (800 mg total) by mouth every 8 (eight) hours as needed. ibuprofen 800 mg tablet, Disp: 30 tablet, Rfl: 1   levothyroxine (SYNTHROID) 50 MCG tablet, Take 50 mcg by mouth daily before breakfast., Disp: , Rfl:    levothyroxine (SYNTHROID, LEVOTHROID) 75 MCG tablet, Take 75 mcg by mouth daily., Disp: , Rfl:    meclizine (ANTIVERT) 25 MG tablet, Take 1 tablet (25 mg total) by mouth 3 (three) times daily as needed for dizziness. Take 1-2 tablets as needed every 8 hours for dizziness., Disp: 30 tablet, Rfl: 0   Syringe/Needle, Disp, (SYRINGE 3CC/25GX1") 25G X 1" 3 ML MISC, One injection monthly, Disp: 12 each, Rfl: 11   UNABLE TO FIND, Med Name: CBD Cream, Disp: , Rfl:    Vitamin D, Ergocalciferol, (DRISDOL) 1.25 MG (50000 UNIT) CAPS capsule, TAKE 1 CAPSULE BY MOUTH EVERY 30 DAYS, Disp: 6 capsule, Rfl: 1  Observations/Objective: There were no vitals filed for this visit. Physical Exam Vitals and nursing note reviewed.  Constitutional:      General:  She is not in acute distress. HENT:     Head: Normocephalic and atraumatic.     Nose:     Right Sinus: Maxillary sinus tenderness present.     Left Sinus: Maxillary sinus tenderness present.  Eyes:     Extraocular Movements: Extraocular movements intact.  Pulmonary:     Effort: Pulmonary effort is normal.  Musculoskeletal:     Cervical back: Normal range of motion.  Neurological:     General: No focal deficit present.     Mental Status: She is alert and  oriented to person, place, and time.  Psychiatric:        Mood and Affect: Mood normal.        Behavior: Behavior normal.    Assessment and Plan: Nasal congestion -     Azelastine HCl; Place 2 sprays into both nostrils daily. Use in each nostril as directed  Dispense: 30 mL; Refill: 12  Bacterial sinusitis -     Amoxicillin-Pot Clavulanate; Take 1 tablet by mouth 2 (two) times daily.  Dispense: 20 tablet; Refill: 0    Follow Up Instructions: Return if symptoms worsen or fail to improve.   I discussed the assessment and treatment plan with the patient. The patient was provided an opportunity to ask questions, and all were answered. The patient agreed with the plan and demonstrated an understanding of the instructions.   The patient was advised to call back or seek an in-person evaluation if the symptoms worsen or if the condition fails to improve as anticipated.  The above assessment and management plan was discussed with the patient. The patient verbalized understanding of and has agreed to the management plan.   I spent 12 minutes on this patient encounter including counseling, diagnosis, treatment options, documentation, face-to-face time.   Sherald Barge, FNP

## 2024-02-11 NOTE — Patient Instructions (Signed)
 I have sent in Augmentin for you to take twice a day for 10 days.  This medication can upset your stomach, so I tell everyone to take it with a meal.  Refilled azelastine.  Recommend taking claritin through the end of April.  Follow-up with me for new or worsening symptoms.

## 2024-02-17 ENCOUNTER — Ambulatory Visit (INDEPENDENT_AMBULATORY_CARE_PROVIDER_SITE_OTHER): Admitting: Internal Medicine

## 2024-02-17 ENCOUNTER — Encounter: Payer: Self-pay | Admitting: Internal Medicine

## 2024-02-17 ENCOUNTER — Ambulatory Visit (INDEPENDENT_AMBULATORY_CARE_PROVIDER_SITE_OTHER): Admitting: Podiatry

## 2024-02-17 ENCOUNTER — Encounter: Payer: Self-pay | Admitting: Podiatry

## 2024-02-17 ENCOUNTER — Ambulatory Visit: Admitting: Internal Medicine

## 2024-02-17 VITALS — BP 116/68 | HR 72 | Temp 98.4°F | Ht 63.5 in | Wt 117.0 lb

## 2024-02-17 DIAGNOSIS — E7801 Familial hypercholesterolemia: Secondary | ICD-10-CM

## 2024-02-17 DIAGNOSIS — R7301 Impaired fasting glucose: Secondary | ICD-10-CM

## 2024-02-17 DIAGNOSIS — M7751 Other enthesopathy of right foot: Secondary | ICD-10-CM

## 2024-02-17 DIAGNOSIS — N951 Menopausal and female climacteric states: Secondary | ICD-10-CM

## 2024-02-17 DIAGNOSIS — R070 Pain in throat: Secondary | ICD-10-CM | POA: Diagnosis not present

## 2024-02-17 LAB — COMPREHENSIVE METABOLIC PANEL WITH GFR
ALT: 7 U/L (ref 0–35)
AST: 18 U/L (ref 0–37)
Albumin: 4.1 g/dL (ref 3.5–5.2)
Alkaline Phosphatase: 62 U/L (ref 39–117)
BUN: 12 mg/dL (ref 6–23)
CO2: 31 meq/L (ref 19–32)
Calcium: 9.1 mg/dL (ref 8.4–10.5)
Chloride: 103 meq/L (ref 96–112)
Creatinine, Ser: 0.76 mg/dL (ref 0.40–1.20)
GFR: 79.03 mL/min (ref >60.00)
Glucose, Bld: 86 mg/dL (ref 70–99)
Potassium: 4.1 meq/L (ref 3.5–5.1)
Sodium: 138 meq/L (ref 135–145)
Total Bilirubin: 0.4 mg/dL (ref 0.2–1.2)
Total Protein: 7 g/dL (ref 6.0–8.3)

## 2024-02-17 LAB — LIPID PANEL
Cholesterol: 213 mg/dL — ABNORMAL HIGH (ref 0–200)
HDL: 49.3 mg/dL (ref >39.00)
LDL Cholesterol: 150 mg/dL — ABNORMAL HIGH (ref 0–99)
NonHDL: 163.5
Total CHOL/HDL Ratio: 4
Triglycerides: 67 mg/dL (ref 0.0–149.0)
VLDL: 13.4 mg/dL (ref 0.0–40.0)

## 2024-02-17 LAB — CBC
HCT: 38.9 % (ref 36.0–46.0)
Hemoglobin: 12.9 g/dL (ref 12.0–15.0)
MCHC: 33.3 g/dL (ref 30.0–36.0)
MCV: 93.3 fl (ref 78.0–100.0)
Platelets: 314 10*3/uL (ref 150.0–400.0)
RBC: 4.17 Mil/uL (ref 3.87–5.11)
RDW: 13.3 % (ref 11.5–15.5)
WBC: 3.6 10*3/uL — ABNORMAL LOW (ref 4.0–10.5)

## 2024-02-17 LAB — HEMOGLOBIN A1C: Hgb A1c MFr Bld: 5.5 % (ref 4.6–6.5)

## 2024-02-17 NOTE — Assessment & Plan Note (Signed)
 Chronic post nasal drip and using flonase at night time (makes her drowsy) and this is helping some. Just finishing up an antibiotic for possible infection.

## 2024-02-17 NOTE — Assessment & Plan Note (Signed)
 She had recent change in dosing of estrogen from gyn and she feels off since then with weight gain and decreased energy. She will call them to resume prior dosing and advised it can take up to 3 months for dosing change to adjust.

## 2024-02-17 NOTE — Assessment & Plan Note (Signed)
 Checking lipid panel and adjust as needed.

## 2024-02-17 NOTE — Patient Instructions (Signed)
 We will have you go back to the higher dose of the estrogen and wait a couple of months.

## 2024-02-17 NOTE — Assessment & Plan Note (Signed)
 Checking HgA1c and adjust as needed.

## 2024-02-17 NOTE — Progress Notes (Signed)
   Subjective:   Patient ID: Jocelyn Parker, female    DOB: Mar 27, 1951, 73 y.o.   MRN: [id number]  HPI The patient is a 73 YO female coming in for issues with hormones thyroid and estrogen. Dosing   Review of Systems  Constitutional:  Positive for unexpected weight change.  HENT:  Positive for postnasal drip.   Eyes: Negative.   Respiratory:  Negative for cough, chest tightness and shortness of breath.   Cardiovascular:  Negative for chest pain, palpitations and leg swelling.  Gastrointestinal:  Negative for abdominal distention, abdominal pain, constipation, diarrhea, nausea and vomiting.  Musculoskeletal: Negative.   Skin: Negative.   Neurological: Negative.   Psychiatric/Behavioral: Negative.      Objective:  Physical Exam Constitutional:      Appearance: She is well-developed.  HENT:     Head: Normocephalic and atraumatic.  Cardiovascular:     Rate and Rhythm: Normal rate and regular rhythm.  Pulmonary:     Effort: Pulmonary effort is normal. No respiratory distress.     Breath sounds: Normal breath sounds. No wheezing or rales.  Abdominal:     General: Bowel sounds are normal. There is no distension.     Palpations: Abdomen is soft.     Tenderness: There is no abdominal tenderness. There is no rebound.  Musculoskeletal:     Cervical back: Normal range of motion.  Skin:    General: Skin is warm and dry.  Neurological:     Mental Status: She is alert and oriented to person, place, and time.     Coordination: Coordination normal.     Vitals:   02/17/24 1258  BP: 116/68  Pulse: 72  Temp: 98.4 F (36.9 C)  TempSrc: Oral  SpO2: 99%  Weight: 117 lb (53.1 kg)  Height: 5' 3.5" (1.613 m)    Assessment & Plan:  Visit time 25 minutes in face to face communication with patient and coordination of care, additional 5 minutes spent in record review, coordination or care, ordering tests, communicating/referring to other healthcare professionals, documenting in medical  records all on the same day of the visit for total time 30 minutes spent on the visit.

## 2024-02-21 NOTE — Progress Notes (Signed)
  Subjective:  Patient ID: Jocelyn Parker, female    DOB: 1951-10-06,  MRN: [id number]  Chief Complaint  Patient presents with   Ankle Pain    RM#13 Follow up on right ankle pain patient states has done therapy doesn't think it was effective at all condition hasn't gotten worse but still present. Patient states has a spot on big right toe would like looked at. Has ankle brace that still uses when walking.    History of Present Illness          73 year old female presents to the office with above concerns.  She states that although her ankle is doing somewhat better she still having discomfort.  She also had an MRI performed since I saw her last.  She been doing physical therapy.  She wears the ankle brace and she also had a taping performed by physical therapy.  She does not report any new injuries or other concerns.   Objective:    Physical Exam         General: AAO x3, NAD  Dermatological: Skin is warm, dry and supple bilateral.  There are no open sores, no preulcerative lesions, no rash or signs of infection present.  Vascular: Dorsalis Pedis artery and Posterior Tibial artery pedal pulses are 2/4 bilateral with immedate capillary fill time.  There is no pain with calf compression, swelling, warmth, erythema.   Neruologic: Grossly intact via light touch bilateral.  Musculoskeletal: Tenderness palpation along the lateral aspect the following area the sinus tarsi and localized edema to this area.  Tenderness along the anterior lateral aspect of the ankle joint as well.  Today this is where she has majority of her discomfort is across the sinus tarsi.  Also, extensor tendons.  Intact.  MMT 5/5.   Gait: Unassisted, Nonantalgic.    No images are attached to the encounter.        Assessment:   Capsulitis right ankle, leg pain  Plan:  Patient was evaluated and treated and all questions answered.  Assessment and Plan           Right Ankle Pain and Swelling Chronic issue  with recent exacerbation. No recent injury. Pain localized to subtalar joint with some tenderness along peroneal tendon. X-rays and ultrasound for DVT were negative. -I have independently reviewed the MRI that was performed in December.  She also has an ultrasound performed.  We discussed injection to the anterior lateral aspect ankle joint for both hopefully diagnostic as well as therapeutic reasons but she would hold off on this for now.  She has been performing physical therapy.  We discussed trying to wean off the brace to help increase the stability.  We discussed shoes, good support to help take the pressure off of the lateral aspect of the foot, ankle as well.  She is to hold off on injections today but if symptoms persist she will likely return for this.  Charity Conch DPM

## 2024-02-25 ENCOUNTER — Encounter: Payer: Self-pay | Admitting: Internal Medicine

## 2024-03-12 ENCOUNTER — Other Ambulatory Visit: Payer: Self-pay | Admitting: Internal Medicine

## 2024-03-12 NOTE — Telephone Encounter (Unsigned)
 Copied from CRM [id number]. Topic: Clinical - Medication Refill >> Mar 12, 2024  9:35 AM [place] M wrote: Medication: cyanocobalamin  (VITAMIN B12) 1000 MCG/ML injection  Has the patient contacted their pharmacy? Yes (Agent: If no, request that the patient contact the pharmacy for the refill. If patient does not wish to contact the pharmacy document the reason why and proceed with request.) (Agent: If yes, when and what did the pharmacy advise?)  This is the patient's preferred pharmacy:  [id number] - [place], [place] [address] AT [address] [place]  [address] Phone: [phone] Fax: [phone]  Is this the correct pharmacy for this prescription? Yes If no, delete pharmacy and type the correct one.   Has the prescription been filled recently? Yes  Is the patient out of the medication? Yes  Has the patient been seen for an appointment in the last year OR does the patient have an upcoming appointment? Yes  Can we respond through MyChart? Yes  Agent: Please be advised that Rx refills may take up to 3 business days. We ask that you follow-up with your pharmacy.

## 2024-03-16 ENCOUNTER — Ambulatory Visit: Payer: Self-pay

## 2024-03-16 ENCOUNTER — Ambulatory Visit: Admitting: Pharmacist

## 2024-03-16 NOTE — Telephone Encounter (Signed)
 Copied from CRM [id number]. Topic: Clinical - Prescription Issue >> Mar 16, 2024 10:38 AM Kita Perish H wrote: Reason for CRM: [place] with [facility] following up on medication refill for patients cyanocobalamin  (VITAMIN B12) 1000 MCG/ML injection, shows pending since 5/9  [place] [phone]

## 2024-03-17 ENCOUNTER — Other Ambulatory Visit: Payer: Self-pay

## 2024-03-17 DIAGNOSIS — E538 Deficiency of other specified B group vitamins: Secondary | ICD-10-CM

## 2024-03-17 MED ORDER — CYANOCOBALAMIN 1000 MCG/ML IJ SOLN: 1000.0000 ug | INTRAMUSCULAR | 3 refills | Status: AC

## 2024-03-17 MED ORDER — SYRINGE 25G X 1" 3 ML MISC: 11 refills | Status: AC

## 2024-03-17 NOTE — Telephone Encounter (Signed)
 This looks like it was denied by zack. Im not sure if this was just a b12 injection done in office or did you prescribe for the patient

## 2024-03-17 NOTE — Telephone Encounter (Signed)
 Done

## 2024-03-17 NOTE — Telephone Encounter (Signed)
 Ok to refill monthly B12 injections she does at home and syringes

## 2024-03-30 ENCOUNTER — Encounter: Payer: Self-pay | Admitting: Podiatry

## 2024-03-30 ENCOUNTER — Ambulatory Visit (INDEPENDENT_AMBULATORY_CARE_PROVIDER_SITE_OTHER): Admitting: Podiatry

## 2024-03-30 DIAGNOSIS — M7751 Other enthesopathy of right foot: Secondary | ICD-10-CM | POA: Diagnosis not present

## 2024-03-31 NOTE — Progress Notes (Signed)
  Subjective:  Patient ID: Jocelyn Parker, female    DOB: Jan 16, 1951,  MRN: [id number] Chief Complaint  Patient presents with   Ankle Pain    RM#12 Follow up on right ankle pain patient states she feels like it is the muscle going into the leg ankle feels good at this time.    History of Present Illness          73 year old female presents to the office with above concerns.  Said that she is still doing therapy.  She states that she is doing better but she still having some discomfort of the right ankle.  We did a steroid injection of the subtalar joint which gave her some relief last year however she points along the anterolateral aspect ankle joint where she has majority discomfort at this time.    Objective:    Physical Exam         General: AAO x3, NAD  Dermatological: Skin is warm, dry and supple bilateral.  There are no open sores, no preulcerative lesions, no rash or signs of infection present.  Vascular: Dorsalis Pedis artery and Posterior Tibial artery pedal pulses are 2/4 bilateral with immedate capillary fill time.  There is no pain with calf compression, swelling, warmth, erythema.   Neruologic: Grossly intact via light touch bilateral.  Musculoskeletal: Tenderness palpation along the lateral aspect the following area the sinus tarsi but today mostly on the anterolateral aspect of the ankle joint.  This is localized edema there is no erythema or warmth.  There is no pain or crepitation with ankle joint range of motion and there is no restriction with ankle range of motion.  She does get some tenderness along the course the peroneal tendon but also along the extensor tendons laterally.  As well but clinically the tendon appears to be intact.   MMT 5/5.   Gait: Unassisted, Nonantalgic.       Assessment:   Capsulitis right ankle, leg pain  Plan:  Patient was evaluated and treated and all questions answered.  Assessment and Plan           Right Ankle Pain and  Swelling -We discussed steroid injection to the anterolateral ankle joint today however initially agreed to hold off on this today as she is be scheduled for surgery soon.  She is in continue with range of motion, rehab exercises.  We had discussed weaning off the ankle brace last appointment she has been doing but she is been trying to increase her walking to help get prepared for surgery.  Discussed that she needs to wear the ankle brace short-term with the extended walking but otherwise I try to wean off the brace and only use the brace as needed.  In the meantime we discussed topical medications and icing. 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  Abnormal TBI.  Charity Conch DPM

## 2024-05-04 HISTORY — PX: LUNG SURGERY: SHX703

## 2024-05-13 ENCOUNTER — Ambulatory Visit: Admitting: Pharmacist

## 2024-05-13 NOTE — Progress Notes (Deleted)
 Patient ID: Garyn Waguespack                 DOB: [birth year]                    MRN: [id number]      HPI: Abril Cappiello is a 73 y.o. female patient referred to lipid clinic by Dr. Wendel. PMH is significant for PAD, CKD, lung cancer, hashimoto's thyroiditis, coronary artery calcification Left main 0, LAD 2, circumflex 64, RCA 0, PDA 0.  Has previously seen Dr. Mona and pharmacy for choleserol. Has declined PCKS9i, ezetimibe and Nexlizet  in the past. LDL-C in April 2025 was 150 which is improved from 179 last year.    Reviewed options for lowering LDL cholesterol, including ezetimibe, PCSK-9 inhibitors, bempedoic acid and inclisiran.  Discussed mechanisms of action, dosing, side effects and potential decreases in LDL cholesterol.  Also reviewed cost information and potential options for patient assistance.   Current Medications:  Intolerances:  atorvastatin , rosuvastatin, simvastatin (myalgias), red yeast rice (per patient, it aggravated her interstitial cystitis)  Risk Factors: PAD, CAD, fx hx LDL-C goal: <70 ApoB goal:   Diet:   Exercise:   Family History: Heart attack in father (died of heart attack, age 3), two aunts and five uncles (ages ranging from 69s-80s), and paternal grandfather (died of heart attack, age 66s); stroke in maternal grandmother   Social History:   Labs: Lipid Panel     Component Value Date/Time   CHOL 213 (H) 02/17/2024 1438   CHOL 235 (H) 04/30/2022 1556   TRIG 67.0 02/17/2024 1438   HDL 49.30 02/17/2024 1438   HDL 57 04/30/2022 1556   CHOLHDL 4 02/17/2024 1438   VLDL 13.4 02/17/2024 1438   LDLCALC 150 (H) 02/17/2024 1438   LDLCALC 168 (H) 04/30/2022 1556   LABVLDL 10 04/30/2022 1556    Past Medical History:  Diagnosis Date   Allergy    sulfa   Chronic interstitial cystitis    Lung cancer (HCC)    per patient    Thyroid  disease     Current Outpatient Medications on File Prior to Visit  Medication Sig Dispense Refill   aspirin  EC 81 MG  tablet Take 1 tablet (81 mg total) by mouth daily.     azelastine  (ASTELIN ) 0.1 % nasal spray Place 2 sprays into both nostrils daily. Use in each nostril as directed 30 mL 12   calcium  carbonate (TUMS - DOSED IN MG ELEMENTAL CALCIUM ) 500 MG chewable tablet 1 tablet     Cholecalciferol (VITAMIN D3) 125 MCG (5000 UT) CAPS Take 1 capsule (5,000 Units total) by mouth daily. 30 capsule 6   cyanocobalamin  (VITAMIN B12) 1000 MCG/ML injection Inject 1 mL (1,000 mcg total) into the muscle every 30 (thirty) days. 10 mL 3   desloratadine  (CLARINEX ) 5 MG tablet Take 1 tablet (5 mg total) by mouth daily. 90 tablet 3   estradiol  (VIVELLE -DOT) 0.1 MG/24HR Place 1 patch (0.1 mg total) onto the skin 2 (two) times a week. 8 patch 12   famotidine  (PEPCID ) 20 MG tablet Take 1 tablet (20 mg total) by mouth 2 (two) times daily. 30 tablet 3   fluticasone  (FLONASE ) 50 MCG/ACT nasal spray Place 2 sprays into both nostrils daily. 16 g 6   ibuprofen  (ADVIL ) 800 MG tablet Take 1 tablet (800 mg total) by mouth every 8 (eight) hours as needed. ibuprofen  800 mg tablet 30 tablet 1   levothyroxine (SYNTHROID) 50 MCG tablet Take 50 mcg  by mouth daily before breakfast.     levothyroxine (SYNTHROID, LEVOTHROID) 75 MCG tablet Take 75 mcg by mouth daily.     meclizine  (ANTIVERT ) 25 MG tablet Take 1 tablet (25 mg total) by mouth 3 (three) times daily as needed for dizziness. Take 1-2 tablets as needed every 8 hours for dizziness. 30 tablet 0   Syringe/Needle, Disp, (SYRINGE 3CC/25GX1) 25G X 1 3 ML MISC One injection monthly 12 each 11   UNABLE TO FIND Med Name: CBD Cream     Vitamin D , Ergocalciferol , (DRISDOL ) 1.25 MG (50000 UNIT) CAPS capsule TAKE 1 CAPSULE BY MOUTH EVERY 30 DAYS 6 capsule 1   No current facility-administered medications on file prior to visit.    Allergies  Allergen Reactions   Sulfa Antibiotics Hives   Montelukast Sodium     dizziness   Vitamin D  (Calciferol) Other (See Comments)    Pt stated can only  take prescription     Assessment/Plan:  1. Hyperlipidemia -  No problem-specific Assessment & Plan notes found for this encounter.    Thank you,  Armilda Vanderlinden D Timera Windt, Pharm.JONETTA SARAN, CPP [facility] [address], [place], [place] [address]  Phone: [phone]; Fax: [phone]

## 2024-05-17 ENCOUNTER — Telehealth: Payer: Self-pay

## 2024-05-17 NOTE — Transitions of Care (Post Inpatient/ED Visit) (Signed)
   05/17/2024  Name: Jocelyn Parker MRN: [id number] DOB: 1950/12/02  Today's TOC FU Call Status: Today's TOC FU Call Status:: Unsuccessful Call (1st Attempt) Unsuccessful Call (1st Attempt) Date: 05/17/24  Attempted to reach the patient regarding the most recent Inpatient/ED visit.  Follow Up Plan: Additional outreach attempts will be made to reach the patient to complete the Transitions of Care (Post Inpatient/ED visit) call.   Medford Balboa, BSN, RN [place]  VBCI - [facility] RN Care Manager [phone]

## 2024-05-17 NOTE — Transitions of Care (Post Inpatient/ED Visit) (Signed)
 05/17/2024  Name: Jocelyn Parker MRN: [id number] DOB: 12/08/1950  Today's TOC FU Call Status: Today's TOC FU Call Status:: Successful TOC FU Call Completed TOC FU Call Complete Date: 05/17/24 Patient's Name and Date of Birth confirmed.  Transition Care Management Follow-up Telephone Call Date of Discharge: 05/16/24 Discharge Facility: Other [facility]) Name of Other ([facility]) Discharge Facility: [facility] Type of Discharge: Inpatient Admission Primary Inpatient Discharge Diagnosis:: Right Lung Lobectomy How have you been since you were released from the hospital?: Better Any questions or concerns?: No  Items Reviewed: Did you receive and understand the discharge instructions provided?: Yes Medications obtained,verified, and reconciled?: Yes (Medications Reviewed) Any new allergies since your discharge?: No Dietary orders reviewed?: Yes Type of Diet Ordered:: Regular Do you have support at home?: Yes People in Home [RPT]: spouse Name of Support/Comfort Primary Source: Jocelyn Parker  Medications Reviewed Today: Medications Reviewed Today     Reviewed by Moises Reusing, RN (Case Manager) on 05/17/24 at 1432  Med List Status: <None>   Medication Order Taking? Sig Documenting Provider Last Dose Status Informant  aspirin  EC 81 MG tablet [id number]  Take 1 tablet (81 mg total) by mouth daily.  Patient not taking: Reported on 05/17/2024   Thukkani, Arun K, MD  Active   azelastine  (ASTELIN ) 0.1 % nasal spray [id number]  Place 2 sprays into both nostrils daily. Use in each nostril as directed  Patient not taking: Reported on 05/17/2024   Alvia Corean CROME, FNP  Active   calcium  carbonate (TUMS - DOSED IN MG ELEMENTAL CALCIUM ) 500 MG chewable tablet [id number] Yes 1 tablet [provider]  Active   Cholecalciferol (VITAMIN D3) 125 MCG (5000 UT) CAPS [id number]  Take 1 capsule (5,000 Units total) by mouth daily.  Patient not taking: Reported on 05/17/2024    Rollene Almarie LABOR, MD  Active   cyanocobalamin  (VITAMIN B12) 1000 MCG/ML injection [id number] Yes Inject 1 mL (1,000 mcg total) into the muscle every 30 (thirty) days. Rollene Almarie LABOR, MD  Active   desloratadine  (CLARINEX ) 5 MG tablet [id number]  Take 1 tablet (5 mg total) by mouth daily. Soldatova, Liuba, MD  Active   estradiol  (VIVELLE -DOT) 0.1 MG/24HR [id number] Yes Place 1 patch (0.1 mg total) onto the skin 2 (two) times a week. Haygood, Vanessa P, MD  Active   famotidine  (PEPCID ) 20 MG tablet [id number]  Take 1 tablet (20 mg total) by mouth 2 (two) times daily.  Patient not taking: Reported on 05/17/2024   Soldatova, Liuba, MD  Active   fluticasone  (FLONASE ) 50 MCG/ACT nasal spray [id number] Yes Place 2 sprays into both nostrils daily. Soldatova, Liuba, MD  Active   ibuprofen  (ADVIL ) 800 MG tablet [id number] Yes Take 1 tablet (800 mg total) by mouth every 8 (eight) hours as needed. ibuprofen  800 mg tablet Waddell Rake, MD  Active   levothyroxine (SYNTHROID) 50 MCG tablet [id number] Yes Take 50 mcg by mouth daily before breakfast. [provider]  Active            Med Note ALVIA, MARISSA C   Thu Oct 19, 2020  8:53 AM) 4 times weekly   levothyroxine (SYNTHROID, LEVOTHROID) 75 MCG tablet [id number] Yes Take 75 mcg by mouth daily. [provider]  Active            Med Note ALVIA, DAVED BROCKS   Thu Oct 19, 2020  8:53 AM) 3 times weekly   meclizine  (ANTIVERT ) 25 MG tablet [id number] Yes Take 1 tablet (25 mg total)  by mouth 3 (three) times daily as needed for dizziness. Take 1-2 tablets as needed every 8 hours for dizziness. Rollene Almarie LABOR, MD  Active   mometasone (NASONEX) 50 MCG/ACT nasal spray [id number] Yes Place 2 sprays into the nose. [provider]  Active   Polyethyl Glyc-Propyl Glyc PF (SYSTANE PRESERVATIVE FREE) 0.4-0.3 % SOLN [id number] Yes Apply 1 drop to eye as needed. [provider]  Active   Probiotic Product (ALIGN PO) [id number] Yes Take 4  mg by mouth daily. [provider]  Active   Syringe/Needle, Disp, (SYRINGE 3CC/25GX1) 25G X 1 3 ML MISC [id number]  One injection monthly Rollene Almarie LABOR, MD  Active   UNABLE TO FIND [id number]  Med Name: CBD Cream [provider]  Active   Vitamin D , Ergocalciferol , (DRISDOL ) 1.25 MG (50000 UNIT) CAPS capsule [id number] Yes TAKE 1 CAPSULE BY MOUTH EVERY 30 DAYS Rollene Almarie LABOR, MD  Active             Home Care and Equipment/Supplies: Were Home Health Services Ordered?: NA Any new equipment or medical supplies ordered?: NA  Functional Questionnaire: Do you need assistance with bathing/showering or dressing?: No Do you need assistance with meal preparation?: No Do you need assistance with eating?: No Do you have difficulty maintaining continence: No Do you need assistance with getting out of bed/getting out of a chair/moving?: No Do you have difficulty managing or taking your medications?: No  Follow up appointments reviewed: PCP Follow-up appointment confirmed?: No (The patient declines PCP appointment) MD Provider Line Number:[phone] Given: No Specialist Hospital Follow-up appointment confirmed?: Yes Date of Specialist follow-up appointment?: 06/01/24 Follow-Up Specialty Provider:: [facility] Do you need transportation to your follow-up appointment?: No Do you understand care options if your condition(s) worsen?: Yes-patient verbalized understanding  SDOH Interventions Today    Flowsheet Row Most Recent Value  SDOH Interventions   Food Insecurity Interventions Intervention Not Indicated  Housing Interventions Intervention Not Indicated  Transportation Interventions Intervention Not Indicated  Utilities Interventions Intervention Not Indicated    Medford Balboa, BSN, RN [place]  VBCI - [facility] RN Care Manager [phone]

## 2024-06-15 ENCOUNTER — Ambulatory Visit (INDEPENDENT_AMBULATORY_CARE_PROVIDER_SITE_OTHER): Admitting: Internal Medicine

## 2024-06-15 VITALS — BP 118/60 | HR 84 | Temp 97.9°F | Ht 63.5 in | Wt 112.0 lb

## 2024-06-15 DIAGNOSIS — R42 Dizziness and giddiness: Secondary | ICD-10-CM

## 2024-06-15 DIAGNOSIS — H9203 Otalgia, bilateral: Secondary | ICD-10-CM

## 2024-06-15 DIAGNOSIS — R0602 Shortness of breath: Secondary | ICD-10-CM

## 2024-06-15 MED ORDER — CIPROFLOXACIN-DEXAMETHASONE 0.3-0.1 % OT SUSP: 4.0000 [drp] | Freq: Two times a day (BID) | OTIC | 0 refills | Status: DC

## 2024-06-15 NOTE — Progress Notes (Signed)
 Subjective:   Patient ID: Jocelyn Parker, female    DOB: 11/12/1950, 73 y.o.   MRN: [id number]  Discussed the use of AI scribe software for clinical note transcription with the patient, who gave verbal consent to proceed. History of Present Illness Jocelyn Parker is a 73 year old female who presents with ear symptoms and dizziness.  She experiences dizziness, particularly when standing up quickly, with episodes lasting a few minutes. She has a history of vertigo, which she believes might be contributing to her current symptoms.  She underwent lung surgery approximately a month ago, and the pathology report indicated that the removed tissue was not malignant. Post-surgery, she experiences nerve pain and numbness around the incision sites, with occasional stabbing pain. Her breathing remains unaffected, and she attributes her good lung condition to regular walking prior to the surgery. This was her second lung surgery, with the first one being in the early stages of disease.  She reports bilateral ear symptoms, including itching and ringing, particularly in the left ear, which worsened after her recent surgery. She had a hearing test and discussed the issue with a Careers adviser at [facility]. The ringing is persistent, and she sometimes has to mentally block it out. She also experiences fullness in her ears, which she associates with sinus issues, although she denies significant sinus pressure or drainage. She reports some drainage and itching in the ears.  Regarding her weight, she mentions a discrepancy in her weight measurements, initially showing a five-pound loss since April, but she believes her weight is stable around 117 pounds. Her appetite has been normal, and she resumed walking shortly after surgery, initially for ten minutes three times a day, and later increased with approval.  Review of Systems  Constitutional:  Positive for fatigue.  HENT:  Positive for ear pain.   Eyes:  Negative.   Respiratory:  Positive for shortness of breath. Negative for cough and chest tightness.   Cardiovascular:  Negative for chest pain, palpitations and leg swelling.  Gastrointestinal:  Negative for abdominal distention, abdominal pain, constipation, diarrhea, nausea and vomiting.  Musculoskeletal: Negative.   Skin: Negative.   Neurological:  Positive for dizziness and light-headedness.  Psychiatric/Behavioral: Negative.      Objective:  Physical Exam Constitutional:      Appearance: She is well-developed.  HENT:     Head: Normocephalic and atraumatic.     Ears:     Comments: Bilateral TM bulging clear fluid. Cardiovascular:     Rate and Rhythm: Normal rate and regular rhythm.  Pulmonary:     Effort: Pulmonary effort is normal. No respiratory distress.     Breath sounds: Normal breath sounds. No wheezing or rales.  Abdominal:     General: Bowel sounds are normal. There is no distension.     Palpations: Abdomen is soft.     Tenderness: There is no abdominal tenderness. There is no rebound.  Musculoskeletal:     Cervical back: Normal range of motion.  Skin:    General: Skin is warm and dry.  Neurological:     Mental Status: She is alert and oriented to person, place, and time.     Coordination: Coordination normal.     Vitals:   06/15/24 0912  BP: 118/60  Pulse: 84  Temp: 97.9 F (36.6 C)  SpO2: 97%  Weight: 112 lb (50.8 kg)  Height: 5' 3.5 (1.613 m)    Assessment and Plan Assessment & Plan Eustachian tube dysfunction with bilateral serous otitis  media   Fluid accumulation is present in both ears, more pronounced in the left, causing a bulging and poppy sensation. There is no infection, but she experiences itching and ringing in the left ear, possibly exacerbated by recent surgery and anesthesia. Prescribe ear drops: 4 drops twice a day for 3-4 days.  Postoperative neuropathic pain after lung surgery   She experiences persistent nerve pain and numbness  around incision sites following recent lung surgery, with stabbing pain episodes consistent with nerve pain. Breathing is unaffected, and overall recovery is progressing well.  History of benign lung neoplasm, status post resection   Following the resection of a benign lung neoplasm, she has been released from surgical care with no malignancy detected. Follow-up CT scans are planned at six months and one year to monitor for recurrence.  Recurrent sinus congestion   She experiences intermittent sinus congestion with some drainage but no significant pressure or cough.  Vertigo   She reports intermittent dizziness, particularly when standing quickly. There is a history of vertigo, but no acute exacerbation is reported. Weight and appetite remain normal post-surgery.

## 2024-06-15 NOTE — Patient Instructions (Signed)
 For the ear drops use 4 drops in each ear twice a day for 4 days.

## 2024-06-17 DIAGNOSIS — H9209 Otalgia, unspecified ear: Secondary | ICD-10-CM | POA: Insufficient documentation

## 2024-06-17 NOTE — Assessment & Plan Note (Signed)
 Overall stable and not significantly changed after recent lung resection. She is exercising daily and encouraged to continue.

## 2024-06-17 NOTE — Assessment & Plan Note (Signed)
 She describes symptoms more of vertigo but has orthostatic hypotension today. She is asked to add electrolytes and fluids to diet. She did have recent lung surgery which can contribute to changes in fluid balance.

## 2024-06-17 NOTE — Assessment & Plan Note (Signed)
 Rx ciprodex  due to bulging TMs bilaterally.

## 2024-06-18 ENCOUNTER — Ambulatory Visit: Payer: Self-pay

## 2024-06-18 NOTE — Telephone Encounter (Signed)
 FYI Only or Action Required?: Action required by provider: Patient requesting additional medication.  Patient was last seen in primary care on 06/15/2024 by Rollene Almarie LABOR, MD.  Called Nurse Triage reporting Sinus Problem.  Symptoms began several days ago.  Interventions attempted: OTC medications: eye drops, Claritin.  Symptoms are: gradually worsening.  Triage Disposition: See PCP When Office is Open (Within 3 Days)  Patient/caregiver understands and will follow disposition?: Yes       Patient was prescribed medication for sinus (ear drops) and was told to call back in 3 days if the problem still occurred. The patient is currently having symptoms regarding sinus believes it can be a sinus infection, but she is not certain. Headaches occur and eye irration   Reason for Disposition  [1] Sinus congestion (pressure, fullness) AND [2] present > 10 days    Patient states she could call the provider with updates regarding symptoms.  Answer Assessment - Initial Assessment Questions Sinus drainage, bilateral ear pain. Used otc eye drops and Claritin for symptoms. Had throat pain, since resolved. Patient states she feels a boost of energy when taking Claritin but states she feels groggy when she does not take it. Patient states provider told her to update her of continuing symptoms. Patient requesting additional medication for symptoms and would like office to reach out to her.     1. LOCATION: Where does it hurt?      Ear and eye pressure with burning.  2. ONSET: When did the sinus pain start?  (e.g., hours, days)      1 weeks ago 3. SEVERITY: How bad is the pain?   (Scale 0-10; or none, mild, moderate or severe)     Mild-ear. Moderate- eye pressure 4. RECURRENT SYMPTOM: Have you ever had sinus problems before? If Yes, ask: When was the last time? and What happened that time?      Yes, during last year fall took antibiotics for symptoms  5. NASAL CONGESTION: Is  the nose blocked? If Yes, ask: Can you open it or must you breathe through your mouth?     No 6. NASAL DISCHARGE: Do you have discharge from your nose? If so ask, What color?     Clear  7. FEVER: Do you have a fever? If Yes, ask: What is it, how was it measured, and when did it start?      No 8. OTHER SYMPTOMS: Do you have any other symptoms? (e.g., sore throat, cough, earache, difficulty breathing)     Fatigue.  Protocols used: Sinus Pain or Congestion-A-AH

## 2024-06-18 NOTE — Telephone Encounter (Signed)
        Patient was prescribed medication for sinus (ear drops) and was told to call back in 3 days if the problem still occurred. The patient is currently having symptoms regarding sinus believes it can be a sinus infection, but she is not certain. Headaches occur and eye irration

## 2024-06-21 ENCOUNTER — Encounter: Payer: Self-pay | Admitting: Internal Medicine

## 2024-06-21 MED ORDER — FLUCONAZOLE 150 MG PO TABS: 150.0000 mg | ORAL_TABLET | Freq: Once | ORAL | 0 refills | Status: AC

## 2024-06-21 MED ORDER — AMOXICILLIN-POT CLAVULANATE 875-125 MG PO TABS: 1.0000 | ORAL_TABLET | Freq: Two times a day (BID) | ORAL | 0 refills | Status: AC

## 2024-06-21 NOTE — Telephone Encounter (Signed)
**Note De-identified  [place] Obfuscation** Please advise 

## 2024-06-21 NOTE — Telephone Encounter (Addendum)
 Patient states that the ear drops has helped them improved but having slight itching and now her eyes burning she also stated to please go off on the information in the my chart message as she mentioned about the ear drops working and if she can get an antibiotic she also stated which one she would prefers due to others if makes her drowsy

## 2024-06-21 NOTE — Telephone Encounter (Signed)
 I don't actually see in note easily if ear symptoms have improved with ear drops or worsened or same

## 2024-06-21 NOTE — Telephone Encounter (Signed)
 This was routed to you first through my chart regarding this. Please advise either through my chart message or patient calls

## 2024-06-22 NOTE — Telephone Encounter (Signed)
 Addressed via mychart

## 2024-08-10 LAB — OPHTHALMOLOGY REPORT-SCANNED

## 2024-08-17 ENCOUNTER — Ambulatory Visit: Payer: Medicare Other

## 2024-09-06 ENCOUNTER — Encounter: Payer: Self-pay | Admitting: Radiology

## 2024-09-07 ENCOUNTER — Ambulatory Visit: Admitting: Internal Medicine

## 2024-09-07 ENCOUNTER — Encounter: Payer: Self-pay | Admitting: Internal Medicine

## 2024-09-07 VITALS — BP 100/60 | HR 72 | Temp 98.0°F | Ht 63.5 in | Wt 118.6 lb

## 2024-09-07 DIAGNOSIS — R7301 Impaired fasting glucose: Secondary | ICD-10-CM | POA: Diagnosis not present

## 2024-09-07 DIAGNOSIS — E78019 Familial hypercholesterolemia, unspecified: Secondary | ICD-10-CM

## 2024-09-07 DIAGNOSIS — E559 Vitamin D deficiency, unspecified: Secondary | ICD-10-CM

## 2024-09-07 DIAGNOSIS — E538 Deficiency of other specified B group vitamins: Secondary | ICD-10-CM

## 2024-09-07 DIAGNOSIS — E042 Nontoxic multinodular goiter: Secondary | ICD-10-CM

## 2024-09-07 DIAGNOSIS — R1011 Right upper quadrant pain: Secondary | ICD-10-CM | POA: Diagnosis not present

## 2024-09-07 LAB — COMPREHENSIVE METABOLIC PANEL WITH GFR
ALT: 13 U/L (ref 0–35)
AST: 17 U/L (ref 0–37)
Albumin: 4.2 g/dL (ref 3.5–5.2)
Alkaline Phosphatase: 58 U/L (ref 39–117)
BUN: 12 mg/dL (ref 6–23)
CO2: 31 meq/L (ref 19–32)
Calcium: 9.2 mg/dL (ref 8.4–10.5)
Chloride: 102 meq/L (ref 96–112)
Creatinine, Ser: 0.83 mg/dL (ref 0.40–1.20)
GFR: 70.83 mL/min (ref >60.00)
Glucose, Bld: 81 mg/dL (ref 70–99)
Potassium: 4.3 meq/L (ref 3.5–5.1)
Sodium: 139 meq/L (ref 135–145)
Total Bilirubin: 0.5 mg/dL (ref 0.2–1.2)
Total Protein: 7.2 g/dL (ref 6.0–8.3)

## 2024-09-07 LAB — CBC
HCT: 39.9 % (ref 36.0–46.0)
Hemoglobin: 13.4 g/dL (ref 12.0–15.0)
MCHC: 33.5 g/dL (ref 30.0–36.0)
MCV: 91.3 fl (ref 78.0–100.0)
Platelets: 244 K/uL (ref 150.0–400.0)
RBC: 4.37 Mil/uL (ref 3.87–5.11)
RDW: 15.5 % (ref 11.5–15.5)
WBC: 3.3 K/uL — ABNORMAL LOW (ref 4.0–10.5)

## 2024-09-07 LAB — LIPID PANEL
Cholesterol: 274 mg/dL — ABNORMAL HIGH (ref 0–200)
HDL: 61 mg/dL (ref >39.00)
LDL Cholesterol: 195 mg/dL — ABNORMAL HIGH (ref 0–99)
NonHDL: 213.23
Total CHOL/HDL Ratio: 4
Triglycerides: 90 mg/dL (ref 0.0–149.0)
VLDL: 18 mg/dL (ref 0.0–40.0)

## 2024-09-07 LAB — HEMOGLOBIN A1C: Hgb A1c MFr Bld: 5.6 % (ref 4.6–6.5)

## 2024-09-07 LAB — LIPASE: Lipase: 18 U/L (ref 11.0–59.0)

## 2024-09-07 MED ORDER — POLYETHYLENE GLYCOL 3350 17 GM/SCOOP PO POWD: 17.0000 g | Freq: Two times a day (BID) | ORAL | 1 refills | Status: DC | PRN

## 2024-09-07 NOTE — Patient Instructions (Addendum)
 Let me know when the CT scan is done so I can look at the results.  I would recommend to try miralax daily I have sent in rx for this.

## 2024-09-07 NOTE — Progress Notes (Unsigned)
   Subjective:   Patient ID: Jocelyn Parker, female    DOB: 07/26/1951, 73 y.o.   MRN: [id number]  Discussed the use of AI scribe software for clinical note transcription with the patient, who gave verbal consent to proceed.  History of Present Illness Jocelyn Parker is a 73 year old female who presents with abdominal bloating and discomfort following recent surgery.  She experiences abdominal bloating and tightness, particularly on the right side, extending from the rib area around to the back. This sensation worsens after eating, even small amounts, such as a scoop of chicken salad. She notes a significant decrease in her ability to consume a full meal, which is unusual for her.  She describes a change in bowel habits, with bowel movements occurring every other day or every two days, whereas previously they were more regular. She experiences a flushed feeling during bowel movements, but it does not alleviate the tightness in her abdomen. No diarrhea, blood in stools, nausea, vomiting, or heartburn.  She mentions a history of a recent trip with a six-hour time change, which she feels may have disrupted her eating and bowel habits. Since returning, she has been unable to return to her previous routine, and her symptoms have progressively worsened over the past month or two.  She underwent robotic lung surgery in July and wonders if her current symptoms could be related to the surgery, such as nerves 'waking up.' She also had an endoscopy earlier this year, which revealed some polyps that were not removed as they were not deemed necessary. She reports having had a colonoscopy and a mammogram, including an ultrasound of the left breast in early October.  She has been taking a product called Align, but notes that the formulation has changed and it does not seem to work as well as before. She is gluten-free and is mindful of this in her dietary choices.  Review of Systems  Objective:   Physical Exam  Vitals:   09/07/24 1106  BP: 100/60  Pulse: 72  Temp: 98 F (36.7 C)  TempSrc: Oral  SpO2: 95%  Weight: 118 lb 9.6 oz (53.8 kg)  Height: 5' 3.5 (1.613 m)    Assessment and Plan Assessment & Plan Right upper quadrant abdominal pain and bloating   Pain and bloating are likely due to postoperative changes, nerve reactivation, or gastrointestinal issues. Endoscopy showed polyps but no significant findings. A CT scan of the chest is ordered to evaluate the upper abdomen, including the liver and gallbladder. Blood work is also ordered to assess for any underlying issues. CT scan results will be reviewed to determine if further imaging or intervention is needed.  Constipation   Constipation may contribute to abdominal bloating and discomfort. Fiber supplementation is considered to improve bowel regularity, ensuring it is gluten-free. A gluten-free diet was discussed.  Postoperative neuropathic pain after lung surgery   Pain is localized to the right upper quadrant, likely due to nerve reactivation post-surgery. Resolution is expected in 6 to 12 months. Pain will be monitored and symptoms assessed for changes.

## 2024-09-08 LAB — TSH: TSH: 3.66 u[IU]/mL (ref 0.35–5.50)

## 2024-09-08 LAB — VITAMIN D 25 HYDROXY (VIT D DEFICIENCY, FRACTURES): VITD: 52.47 ng/mL (ref 30.00–100.00)

## 2024-09-08 LAB — VITAMIN B12: Vitamin B-12: >1500 pg/mL — ABNORMAL HIGH (ref 211–911)

## 2024-09-10 DIAGNOSIS — R1011 Right upper quadrant pain: Secondary | ICD-10-CM | POA: Insufficient documentation

## 2024-09-10 NOTE — Assessment & Plan Note (Signed)
 Checking HgA1c and adjust as needed.

## 2024-09-10 NOTE — Assessment & Plan Note (Signed)
Checking levels and adjust as needed.

## 2024-09-10 NOTE — Assessment & Plan Note (Signed)
 Pain and bloating are likely due to postoperative changes, nerve reactivation, or gastrointestinal issues. Endoscopy showed polyps but no significant findings. A CT scan of the chest is ordered to evaluate the upper abdomen, including the liver and gallbladder. Blood work is also ordered to assess for any underlying issues. CT scan results will be reviewed to determine if further imaging or intervention is needed.

## 2024-09-10 NOTE — Assessment & Plan Note (Signed)
 Checking lipid panel and adjust as needed.

## 2024-09-13 ENCOUNTER — Ambulatory Visit (INDEPENDENT_AMBULATORY_CARE_PROVIDER_SITE_OTHER): Admitting: Podiatry

## 2024-09-13 ENCOUNTER — Encounter: Payer: Self-pay | Admitting: Podiatry

## 2024-09-13 ENCOUNTER — Telehealth: Payer: Self-pay | Admitting: Lab

## 2024-09-13 ENCOUNTER — Ambulatory Visit: Payer: Self-pay | Admitting: Internal Medicine

## 2024-09-13 DIAGNOSIS — S90221A Contusion of right lesser toe(s) with damage to nail, initial encounter: Secondary | ICD-10-CM

## 2024-09-13 DIAGNOSIS — M7751 Other enthesopathy of right foot: Secondary | ICD-10-CM | POA: Diagnosis not present

## 2024-09-13 DIAGNOSIS — M21619 Bunion of unspecified foot: Secondary | ICD-10-CM | POA: Diagnosis not present

## 2024-09-13 NOTE — Telephone Encounter (Signed)
 Patient is calling to find out if there is brand of inserts she should be using any recommendations.

## 2024-09-13 NOTE — Progress Notes (Signed)
 Subjective:  Patient ID: Jocelyn Parker, female    DOB: 1951/06/24,  MRN: [id number]  Chief Complaint  Patient presents with   Ankle Pain    Rm12 Right ankle swollen and tender for 3 weeks no treatment.    Discussed the use of AI scribe software for clinical note transcription with the patient, who gave verbal consent to proceed.  History of Present Illness Jocelyn Parker is a 73 year old female who presents with recurrent right ankle pain and toenail discoloration.  Three weeks ago, she experienced a recurrence of right ankle pain without specific injury or change in activity. The pain, initially intense, has decreased but persists as a pulling sensation. She wears a brace when walking, which she was not consistently using before. There is no recent swelling, although occasional swelling subsides with rest.  Discoloration of the right second toenail was noticed at the end of August and remains unchanged. She recalls a similar past issue with another toenail that resolved as the nail grew out. She attributes the current discoloration to increased walking before her surgery.  Her big toe is leaning, and she is interested in preventive measures.       Objective:  There were no vitals filed for this visit.  Physical Exam General: AAO x3, NAD  Dermatological: There is dried blood present near the right second digit toenail.  There is no extension of hyperpigmentation in the surrounding skin.  Has more of a brown, red discoloration as opposed to black.  No open lesions.  Vascular: Dorsalis Pedis artery and Posterior Tibial artery pedal pulses are 2/4 bilateral with immedate capillary fill time. There is no pain with calf compression, swelling, warmth, erythema.   Neruologic: Grossly intact via light touch bilateral.   Musculoskeletal: Minimal hallux abductus noted on the left side.  The majority tenderness and the reason for her visit today is mostly to the right ankle.   She has tenderness palpation of the sinus tarsi and the right ankle.  There is a mild discomfort in the extensor tendon on the anterior lateral aspect of the ankle joint.  There is no pain or crepitation with ankle joint range of motion.  There is no area of pinpoint tenderness otherwise.  MMT 5/5.    No images are attached to the encounter.    Results     Assessment:   1. Capsulitis of ankle, right   2. Bunion   3. Subungual hematoma of toe of right foot, initial encounter      Plan:  Patient was evaluated and treated and all questions answered.  Assessment and Plan Assessment & Plan Right subtalar joint inflammation Recurrent inflammation with pain and swelling, exacerbated by improper footwear and walking patterns. Muscle strain may contribute. - Administered steroid injection to the right subtalar joint.  Verbal consent obtained.  Cleaned skin with Betadine, alcohol.  Mixture 1 cc Kenalog 10, 0.5 cc of Marcaine plain, 0.5 cc lidocaine  plain was infiltrated into the sinus tarsi without complications.  Postinjection care discussed.  Tolerated well. - Advised icing the joint post-injection. - Recommended wearing a brace during walking. - Provided exercises to strengthen the ankle joint - Suggested over-the-counter shoe inserts for better arch support. - Advised against exercise walking on the day of injection.  Subungual hematoma, right second toenail Subungual hematoma likely due to microtrauma from increased walking and hammer toes. No signs of infection or worsening. - Monitor the toenail for changes or worsening. - Advised that the hematoma  will grow out over several months.  If no improvement consider biopsy  Early bunion formation, right foot Early signs of bunion formation likely due to muscle imbalance and improper footwear. - Recommended shoes with good arch support. - Provided a toe spacer to help prevent progression.  Foot pronation, right foot Mild pronation  contributing to biomechanical issues. - Recommended over-the-counter shoe inserts to provide arch support.    Return if symptoms worsen or fail to improve.   Donnice JONELLE Fees Parker

## 2024-09-13 NOTE — Patient Instructions (Signed)
For inserts I like POWERSTEPS, SUPERFEET, AETREX  ---    While at your visit today you received a steroid injection in your foot or ankle to help with your pain. Along with having the steroid medication there is some "numbing" medication in the shot that you received. Due to this you may notice some numbness to the area for the next couple of hours.   I would recommend limiting activity for the next few days to help the steroid injection take affect.    The actually benefit from the steroid injection may take up to 2-7 days to see a difference. You may actually experience a small (as in 10%) INCREASE in pain in the first 24 hours---that is common. It would be best if you can ice the area today and take anti-inflammatory medications (such as Ibuprofen, Motrin, or Aleve) if you are able to take these medications. If you were prescribed another medication to help with the pain go ahead and start that medication today    Things to watch out for that you should contact us or a health care provider urgently would include: 1. Unusual (as in more than 10%) increase in pain 2. New fever > 101.5 3. New swelling or redness of the injected area.  4. Streaking of red lines around the area injected.  If you have any questions or concerns about this, please give our office a call at [phone].    ---   Achilles Tendinitis  with Rehab Achilles tendinitis is a disorder of the Achilles tendon. The Achilles tendon connects the large calf muscles (Gastrocnemius and Soleus) to the heel bone (calcaneus). This tendon is sometimes called the heel cord. It is important for pushing-off and standing on your toes and is important for walking, running, or jumping. Tendinitis is often caused by overuse and repetitive microtrauma. SYMPTOMS Pain, tenderness, swelling, warmth, and redness may occur over the Achilles tendon even at rest. Pain with pushing off, or flexing or extending the ankle. Pain that is worsened  after or during activity. CAUSES  Overuse sometimes seen with rapid increase in exercise programs or in sports requiring running and jumping. Poor physical conditioning (strength and flexibility or endurance). Running sports, especially training running down hills. Inadequate warm-up before practice or play or failure to stretch before participation. Injury to the tendon. PREVENTION  Warm up and stretch before practice or competition. Allow time for adequate rest and recovery between practices and competition. Keep up conditioning. Keep up ankle and leg flexibility. Improve or keep muscle strength and endurance. Improve cardiovascular fitness. Use proper technique. Use proper equipment (shoes, skates). To help prevent recurrence, taping, protective strapping, or an adhesive bandage may be recommended for several weeks after healing is complete. PROGNOSIS  Recovery may take weeks to several months to heal. Longer recovery is expected if symptoms have been prolonged. Recovery is usually quicker if the inflammation is due to a direct blow as compared with overuse or sudden strain. RELATED COMPLICATIONS  Healing time will be prolonged if the condition is not correctly treated. The injury must be given plenty of time to heal. Symptoms can reoccur if activity is resumed too soon. Untreated, tendinitis may increase the risk of tendon rupture requiring additional time for recovery and possibly surgery. TREATMENT  The first treatment consists of rest anti-inflammatory medication, and ice to relieve the pain. Stretching and strengthening exercises after resolution of pain will likely help reduce the risk of recurrence. Referral to a physical therapist or athletic trainer for  further evaluation and treatment may be helpful. A walking boot or cast may be recommended to rest the Achilles tendon. This can help break the cycle of inflammation and microtrauma. Arch supports (orthotics) may be prescribed  or recommended by your caregiver as an adjunct to therapy and rest. Surgery to remove the inflamed tendon lining or degenerated tendon tissue is rarely necessary and has shown less than predictable results. MEDICATION  Nonsteroidal anti-inflammatory medications, such as aspirin and ibuprofen, may be used for pain and inflammation relief. Do not take within 7 days before surgery. Take these as directed by your caregiver. Contact your caregiver immediately if any bleeding, stomach upset, or signs of allergic reaction occur. Other minor pain relievers, such as acetaminophen, may also be used. Pain relievers may be prescribed as necessary by your caregiver. Do not take prescription pain medication for longer than 4 to 7 days. Use only as directed and only as much as you need. Cortisone injections are rarely indicated. Cortisone injections may weaken tendons and predispose to rupture. It is better to give the condition more time to heal than to use them. HEAT AND COLD Cold is used to relieve pain and reduce inflammation for acute and chronic Achilles tendinitis. Cold should be applied for 10 to 15 minutes every 2 to 3 hours for inflammation and pain and immediately after any activity that aggravates your symptoms. Use ice packs or an ice massage. Heat may be used before performing stretching and strengthening activities prescribed by your caregiver. Use a heat pack or a warm soak. SEEK MEDICAL CARE IF: Symptoms get worse or do not improve in 2 weeks despite treatment. New, unexplained symptoms develop. Drugs used in treatment may produce side effects.  EXERCISES:  RANGE OF MOTION (ROM) AND STRETCHING EXERCISES - Achilles Tendinitis  These exercises may help you when beginning to rehabilitate your injury. Your symptoms may resolve with or without further involvement from your physician, physical therapist or athletic trainer. While completing these exercises, remember:  Restoring tissue flexibility helps  normal motion to return to the joints. This allows healthier, less painful movement and activity. An effective stretch should be held for at least 30 seconds. A stretch should never be painful. You should only feel a gentle lengthening or release in the stretched tissue.  STRETCH  Gastroc, Standing  Place hands on wall. Extend right / left leg, keeping the front knee somewhat bent. Slightly point your toes inward on your back foot. Keeping your right / left heel on the floor and your knee straight, shift your weight toward the wall, not allowing your back to arch. You should feel a gentle stretch in the right / left calf. Hold this position for 10 seconds. Repeat 3 times. Complete this stretch 2 times per day.  STRETCH  Soleus, Standing  Place hands on wall. Extend right / left leg, keeping the other knee somewhat bent. Slightly point your toes inward on your back foot. Keep your right / left heel on the floor, bend your back knee, and slightly shift your weight over the back leg so that you feel a gentle stretch deep in your back calf. Hold this position for 10 seconds. Repeat 3 times. Complete this stretch 2 times per day.  STRETCH  Gastrocsoleus, Standing  Note: This exercise can place a lot of stress on your foot and ankle. Please complete this exercise only if specifically instructed by your caregiver.  Place the ball of your right / left foot on a step, keeping  your other foot firmly on the same step. Hold on to the wall or a rail for balance. Slowly lift your other foot, allowing your body weight to press your heel down over the edge of the step. You should feel a stretch in your right / left calf. Hold this position for 10 seconds. Repeat this exercise with a slight bend in your knee. Repeat 3 times. Complete this stretch 2 times per day.   STRENGTHENING EXERCISES - Achilles Tendinitis These exercises may help you when beginning to rehabilitate your injury. They may resolve  your symptoms with or without further involvement from your physician, physical therapist or athletic trainer. While completing these exercises, remember:  Muscles can gain both the endurance and the strength needed for everyday activities through controlled exercises. Complete these exercises as instructed by your physician, physical therapist or athletic trainer. Progress the resistance and repetitions only as guided. You may experience muscle soreness or fatigue, but the pain or discomfort you are trying to eliminate should never worsen during these exercises. If this pain does worsen, stop and make certain you are following the directions exactly. If the pain is still present after adjustments, discontinue the exercise until you can discuss the trouble with your clinician.  STRENGTH - Plantar-flexors  Sit with your right / left leg extended. Holding onto both ends of a rubber exercise band/tubing, loop it around the ball of your foot. Keep a slight tension in the band. Slowly push your toes away from you, pointing them downward. Hold this position for 10 seconds. Return slowly, controlling the tension in the band/tubing. Repeat 3 times. Complete this exercise 2 times per day.   STRENGTH - Plantar-flexors  Stand with your feet shoulder width apart. Steady yourself with a wall or table using as little support as needed. Keeping your weight evenly spread over the width of your feet, rise up on your toes.* Hold this position for 10 seconds. Repeat 3 times. Complete this exercise 2 times per day.  *If this is too easy, shift your weight toward your right / left leg until you feel challenged. Ultimately, you may be asked to do this exercise with your right / left foot only.  STRENGTH  Plantar-flexors, Eccentric  Note: This exercise can place a lot of stress on your foot and ankle. Please complete this exercise only if specifically instructed by your caregiver.  Place the balls of your feet on a  step. With your hands, use only enough support from a wall or rail to keep your balance. Keep your knees straight and rise up on your toes. Slowly shift your weight entirely to your right / left toes and pick up your opposite foot. Gently and with controlled movement, lower your weight through your right / left foot so that your heel drops below the level of the step. You will feel a slight stretch in the back of your calf at the end position. Use the healthy leg to help rise up onto the balls of both feet, then lower weight only on the right / left leg again. Build up to 15 repetitions. Then progress to 3 consecutive sets of 15 repetitions.* After completing the above exercise, complete the same exercise with a slight knee bend (about 30 degrees). Again, build up to 15 repetitions. Then progress to 3 consecutive sets of 15 repetitions.* Perform this exercise 2 times per day.  *When you easily complete 3 sets of 15, your physician, physical therapist or athletic trainer may advise you  to add resistance by wearing a backpack filled with additional weight.  STRENGTH - Plantar Flexors, Seated  Sit on a chair that allows your feet to rest flat on the ground. If necessary, sit at the edge of the chair. Keeping your toes firmly on the ground, lift your right / left heel as far as you can without increasing any discomfort in your ankle. Repeat 3 times. Complete this exercise 2 times a day.

## 2024-09-14 ENCOUNTER — Ambulatory Visit: Admitting: Internal Medicine

## 2024-09-14 NOTE — Telephone Encounter (Signed)
 Spoke to patient address her concerns.

## 2024-09-16 ENCOUNTER — Ambulatory Visit: Admitting: Podiatry

## 2024-09-16 NOTE — Telephone Encounter (Signed)
 Spoke with patient today I explained and apologized for the miscommunication and that powersteps and ppwk for home therapy exercises will be left at front desk for her to Pick up  No charge  Jocelyn Parker

## 2024-09-23 ENCOUNTER — Ambulatory Visit: Admitting: Podiatry

## 2024-09-27 ENCOUNTER — Other Ambulatory Visit

## 2024-09-27 ENCOUNTER — Ambulatory Visit: Admitting: Podiatry

## 2024-10-04 ENCOUNTER — Other Ambulatory Visit: Payer: Self-pay | Admitting: Gastroenterology

## 2024-10-04 ENCOUNTER — Ambulatory Visit: Admitting: Podiatry

## 2024-10-04 DIAGNOSIS — R1084 Generalized abdominal pain: Secondary | ICD-10-CM

## 2024-10-05 ENCOUNTER — Encounter: Payer: Self-pay | Admitting: Gastroenterology

## 2024-10-06 ENCOUNTER — Inpatient Hospital Stay
Admission: RE | Admit: 2024-10-06 | Discharge: 2024-10-06 | Disposition: A | Source: Ambulatory Visit | Attending: Gastroenterology | Admitting: Gastroenterology

## 2024-10-06 DIAGNOSIS — R1084 Generalized abdominal pain: Secondary | ICD-10-CM

## 2024-10-06 MED ORDER — IOPAMIDOL (ISOVUE-300) INJECTION 61%
100.0000 mL | Freq: Once | INTRAVENOUS | Status: AC | PRN
  Administered 2024-10-06: 100 mL via INTRAVENOUS

## 2024-10-26 ENCOUNTER — Telehealth: Payer: Self-pay

## 2024-10-26 NOTE — Telephone Encounter (Signed)
 Copied from CRM #[id number]. Topic: Clinical - Medication Question >> Oct 26, 2024  2:21 PM Lauren C wrote: Reason for CRM: Pt feels like she has the same thing she has last year, sinusitis, and requesting antibiotics to be sent in before christmas because she will be traveling. I set an acute visit for another location for tomorrow due to Dr. Rollene being out of office, but pt wants to know if either antibiotics can just be called in for her or if anyone at [facility] could fit her in tomorrow because the location is much closer.   [id number] - [place], [place] - [address] AT [facility] [address] [place] [address] Phone: [phone] Fax: [phone]

## 2024-10-27 ENCOUNTER — Ambulatory Visit (INDEPENDENT_AMBULATORY_CARE_PROVIDER_SITE_OTHER): Admitting: Family Medicine

## 2024-10-27 ENCOUNTER — Telehealth: Payer: Self-pay

## 2024-10-27 VITALS — BP 98/58 | HR 76 | Temp 98.3°F | Resp 12 | Ht 63.5 in | Wt 118.0 lb

## 2024-10-27 DIAGNOSIS — B9689 Other specified bacterial agents as the cause of diseases classified elsewhere: Secondary | ICD-10-CM | POA: Diagnosis not present

## 2024-10-27 DIAGNOSIS — J329 Chronic sinusitis, unspecified: Secondary | ICD-10-CM | POA: Diagnosis not present

## 2024-10-27 MED ORDER — AMOXICILLIN 875 MG PO TABS: 875.0000 mg | ORAL_TABLET | Freq: Two times a day (BID) | ORAL | 0 refills | Status: AC

## 2024-10-27 NOTE — Progress Notes (Signed)
" ° °  Subjective:    Patient ID: Jocelyn Parker, female    DOB: [birth year], 73 y.o.   MRN: [id number]  HPI URI- sxs started last week.  'i think it's a sinus infection'.  + HA, hoarseness, PND drainage, dry cough.  No fever.  + frontal and maxillary sinus pain.  Bilateral ear fullness.   Review of Systems For ROS see HPI     Objective:   Physical Exam Vitals reviewed.  Constitutional:      General: She is not in acute distress.    Appearance: She is well-developed. She is not ill-appearing.  HENT:     Head: Normocephalic and atraumatic.     Right Ear: Tympanic membrane normal.     Left Ear: Tympanic membrane normal.     Nose: Mucosal edema and congestion present. No rhinorrhea.     Right Sinus: Maxillary sinus tenderness and frontal sinus tenderness present.     Left Sinus: Maxillary sinus tenderness and frontal sinus tenderness present.     Mouth/Throat:     Pharynx: Uvula midline. No oropharyngeal exudate or posterior oropharyngeal erythema.  Eyes:     Conjunctiva/sclera: Conjunctivae normal.     Pupils: Pupils are equal, round, and reactive to light.  Cardiovascular:     Rate and Rhythm: Normal rate and regular rhythm.     Heart sounds: Normal heart sounds.  Pulmonary:     Effort: Pulmonary effort is normal. No respiratory distress.     Breath sounds: Normal breath sounds. No wheezing.  Musculoskeletal:     Cervical back: Normal range of motion and neck supple.  Lymphadenopathy:     Cervical: No cervical adenopathy.  Skin:    General: Skin is warm and dry.  Neurological:     General: No focal deficit present.     Mental Status: She is alert and oriented to person, place, and time.     Cranial Nerves: No cranial nerve deficit.     Motor: No weakness.     Coordination: Coordination normal.  Psychiatric:        Mood and Affect: Mood normal.        Behavior: Behavior normal.        Thought Content: Thought content normal.           Assessment & Plan:  Bacterial  sinusitis- new.  Pt reports this feels similar to previous sinus infxns.  She is worried sxs will worsen over the holiday weekend.  Start Amoxicillin  BID.  Reviewed supportive care and red flags that should prompt return.  Pt expressed understanding and is in agreement w/ plan.   "

## 2024-10-27 NOTE — Telephone Encounter (Signed)
 Copied from CRM #[id number]. Topic: Clinical - Medication Question >> Oct 26, 2024  2:21 PM Lauren C wrote: Reason for CRM: Pt feels like she has the same thing she has last year, sinusitis, and requesting antibiotics to be sent in before christmas because she will be traveling. I set an acute visit for another location for tomorrow due to Dr. Rollene being out of office, but pt wants to know if either antibiotics can just be called in for her or if anyone at [facility] could fit her in tomorrow because the location is much closer.   [id number] - [place], [place] - [address] AT [facility] [address] [place] [address] Phone: [phone] Fax: [phone] >> Oct 26, 2024  4:56 PM Drema MATSU wrote: Patient is calling for an update on request.

## 2024-10-27 NOTE — Patient Instructions (Signed)
 Follow up as needed or as scheduled START the Amoxicillin  twice daily- take w/ food Drink LOTS of fluids REST! Claritin as needed for congestion/pressure Call with any questions or concerns Hang in there! Happy Holidays!

## 2024-11-01 NOTE — Progress Notes (Unsigned)
 "  Office Visit Note  Patient: Jocelyn Parker             Date of Birth: 17-Nov-1950           MRN: [id number]             PCP: Rollene Almarie LABOR, MD Referring: Rollene Almarie LABOR, MD Visit Date: 11/11/2024 Occupation: Data Unavailable  Subjective:  No chief complaint on file.   History of Present Illness: Jocelyn Parker is a 73 y.o. female ***     Activities of Daily Living:  Patient reports morning stiffness for *** {minute/hour:19697}.   Patient {ACTIONS;DENIES/REPORTS:21021675::Denies} nocturnal pain.  Difficulty dressing/grooming: {ACTIONS;DENIES/REPORTS:21021675::Denies} Difficulty climbing stairs: {ACTIONS;DENIES/REPORTS:21021675::Denies} Difficulty getting out of chair: {ACTIONS;DENIES/REPORTS:21021675::Denies} Difficulty using hands for taps, buttons, cutlery, and/or writing: {ACTIONS;DENIES/REPORTS:21021675::Denies}  No Rheumatology ROS completed.   PMFS History:  Patient Active Problem List   Diagnosis Date Noted   RUQ pain 09/10/2024   Ear pain 06/17/2024   Throat pain 11/04/2023   Acute right ankle pain 07/28/2023   Coronary artery calcification 12/02/2022   Leg pain 11/12/2022   Familial hypercholesterolemia 02/12/2022   Celiac disease 07/17/2021   Impaired fasting glucose 07/17/2021   Vertigo 03/25/2021   Gastritis with intestinal metaplasia of stomach 01/10/2021   S/P partial lobectomy of lung 04/12/2020   Nodule of upper lobe of right lung 04/05/2020   Bilateral breast cysts 12/27/2019   Multiple cysts of breast 12/27/2019   Malignant neoplasm of upper lobe of right lung (HCC) 10/12/2019   Sensorineural hearing loss (SNHL) of both ears 06/20/2019   Adenocarcinoma of lung, stage 1, right (HCC) 05/26/2019   Tinnitus of both ears 05/12/2019   B12 deficiency 04/11/2019   Cyst of ovary 04/01/2019   Menopause syndrome 04/01/2019   Osteopenia 04/01/2019   Vitamin D  deficiency 05/24/2015   Xanthoma of eyelid 12/21/2014   Cyst of kidney,  acquired 12/20/2014   Interstitial cystitis (chronic) without hematuria 03/20/2012   Shortness of breath 08/16/2008   Nontoxic multinodular goiter 08/08/2007    Past Medical History:  Diagnosis Date   Allergy    sulfa   Chronic interstitial cystitis    GERD (gastroesophageal reflux disease) Unsure   Lung cancer (HCC)    per patient    Thyroid  disease     Family History  Problem Relation Age of Onset   Renal cancer Mother    Arthritis Mother    Cancer Mother    Kidney disease Mother    Heart attack Father    Heart disease Father    Stroke Maternal Grandmother    Heart attack Paternal Grandfather    Multiple sclerosis Sister    Cancer Brother    Diabetes Brother    Arthritis Sister    Diabetes Sister    Past Surgical History:  Procedure Laterality Date   ABDOMINAL HYSTERECTOMY     BREAST SURGERY  05/2019   Non-cancer-- history of non-cancer lumps   lumps removed from breast     1970's ,1990's and in 2000's  nono cancer    LUNG SURGERY  04/05/2020   Social History[1] Social History   Social History Narrative   Not on file     Immunization History  Administered Date(s) Administered   Influenza-Unspecified 12/28/2001   PFIZER(Purple Top)SARS-COV-2 Vaccination 12/03/2019, 12/31/2019, 07/05/2020, 08/04/2020, 02/09/2021, 07/21/2021   PNEUMOCOCCAL CONJUGATE-20 01/16/2022   PPD Test 11/27/2015   Pfizer Covid-19 Vaccine Bivalent Booster 54yrs & up 07/17/2021   Pfizer(Comirnaty)Fall Seasonal Vaccine 12 years and older 08/28/2022  Tdap 07/22/2008     Objective: Vital Signs: There were no vitals taken for this visit.   Physical Exam   Musculoskeletal Exam: ***  CDAI Exam: CDAI Score: -- Patient Global: --; Provider Global: -- Swollen: --; Tender: -- Joint Exam 11/11/2024   No joint exam has been documented for this visit   There is currently no information documented on the homunculus. Go to the Rheumatology activity and complete the homunculus joint  exam.  Investigation: No additional findings.  Imaging: CT ABDOMEN PELVIS W CONTRAST Result Date: 10/11/2024 CLINICAL DATA:  Abdominal pain, worst in right lower quadrant. EXAM: CT ABDOMEN AND PELVIS WITH CONTRAST TECHNIQUE: Multidetector CT imaging of the abdomen and pelvis was performed using the standard protocol following bolus administration of intravenous contrast. RADIATION DOSE REDUCTION: This exam was performed according to the departmental dose-optimization program which includes automated exposure control, adjustment of the mA and/or kV according to patient size and/or use of iterative reconstruction technique. CONTRAST:  100mL ISOVUE -300 IOPAMIDOL  (ISOVUE -300) INJECTION 61% COMPARISON:  01/06/2013 FINDINGS: Lower Chest: No acute findings. Hepatobiliary: No suspicious hepatic masses identified. Gallbladder is unremarkable. No evidence of biliary ductal dilatation. Pancreas:  No mass or inflammatory changes. Spleen: Within normal limits in size. A few tiny sub-centimeter low-attenuation lesions are too small to characterize, but most likely represent benign cysts or hemangiomas. Adrenals/Urinary Tract: No suspicious masses identified. Tiny benign-appearing right renal cysts are noted (No followup imaging is recommended). No evidence of ureteral calculi or hydronephrosis. Stomach/Bowel: No evidence of obstruction, inflammatory process or abnormal fluid collections. Normal appendix visualized. Vascular/Lymphatic: No pathologically enlarged lymph nodes. No acute vascular findings. Reproductive: Prior hysterectomy noted. Adnexal regions are unremarkable in appearance. Other:  None. Musculoskeletal:  No suspicious bone lesions identified. IMPRESSION: No acute findings or other significant abnormality. Electronically Signed   By: Norleen DELENA Kil M.D.   On: 10/11/2024 15:38    Recent Labs: Lab Results  Component Value Date   WBC 3.3 (L) 09/07/2024   HGB 13.4 09/07/2024   PLT 244.0 09/07/2024   NA 139  09/07/2024   K 4.3 09/07/2024   CL 102 09/07/2024   CO2 31 09/07/2024   GLUCOSE 81 09/07/2024   BUN 12 09/07/2024   CREATININE 0.83 09/07/2024   BILITOT 0.5 09/07/2024   ALKPHOS 58 09/07/2024   AST 17 09/07/2024   ALT 13 09/07/2024   PROT 7.2 09/07/2024   ALBUMIN 4.2 09/07/2024   CALCIUM  9.2 09/07/2024   GFRAA >60 02/04/2016   QFTBGOLDPLUS NEGATIVE 05/26/2019    Speciality Comments: No specialty comments available.  Procedures:  No procedures performed Allergies: Sulfa antibiotics, Gluten meal, Montelukast sodium, Tramadol , and Vitamin d  (calciferol)   Assessment / Plan:     Visit Diagnoses: No diagnosis found.  Orders: No orders of the defined types were placed in this encounter.  No orders of the defined types were placed in this encounter.   Face-to-face time spent with patient was *** minutes. Greater than 50% of time was spent in counseling and coordination of care.  Follow-Up Instructions: No follow-ups on file.   Daved JAYSON Gavel, CMA  Note - This record has been created using Animal nutritionist.  Chart creation errors have been sought, but may not always  have been located. Such creation errors do not reflect on  the standard of medical care.    [1]  Social History Tobacco Use   Smoking status: Never    Passive exposure: Never   Smokeless tobacco: Never  Vaping Use  Vaping status: Never Used  Substance Use Topics   Alcohol use: Yes    Comment: special occassions   Drug use: No   "

## 2024-11-05 LAB — HM DEXA SCAN

## 2024-11-08 ENCOUNTER — Encounter: Payer: Self-pay | Admitting: Internal Medicine

## 2024-11-09 NOTE — Progress Notes (Unsigned)
 "  Office Visit Note  Patient: Jocelyn Parker             Date of Birth: [birth year]           MRN: [id number]             PCP: Rollene Almarie LABOR, MD Referring: Rollene Almarie LABOR, MD Visit Date: 11/10/2024 Occupation: Data Unavailable  Subjective:  Pain in multiple joints  History of Present Illness: Alishba Naples is a 74 y.o. female with positive ANA, osteoarthritis and degenerative disc disease.  She returns today after her last visit on November 12, 2023.  She continues to have some pain and stiffness in her knee joints, right ankle, cervical and thoracic region.  She also has intermittent lower back pain.  She notices some discomfort in her hands.  She has been seeing Dr. Gershon for right ankle discomfort and had few injections.  Raynaud's phenominon is not very active.  She had a right lung wedge resection in July 2025 which according the patient the pathology was benign.    Activities of Daily Living:  Patient reports morning stiffness for 0 minutes.   Patient Denies nocturnal pain.  Difficulty dressing/grooming: Denies Difficulty climbing stairs: Denies Difficulty getting out of chair: Denies Difficulty using hands for taps, buttons, cutlery, and/or writing: Denies  Review of Systems  Constitutional:  Negative for fatigue.  HENT:  Negative for mouth sores and mouth dryness.   Eyes:  Positive for dryness.  Respiratory:  Negative for shortness of breath.   Cardiovascular:  Negative for chest pain and palpitations.  Gastrointestinal:  Negative for blood in stool, constipation and diarrhea.  Endocrine: Negative for increased urination.  Genitourinary:  Negative for involuntary urination.  Musculoskeletal:  Positive for joint swelling. Negative for joint pain, gait problem, joint pain, myalgias, muscle weakness, morning stiffness, muscle tenderness and myalgias.  Skin:  Positive for color change. Negative for rash, hair loss and sensitivity to sunlight.   Allergic/Immunologic: Negative for susceptible to infections.  Neurological:  Positive for numbness. Negative for dizziness and headaches.  Hematological:  Negative for swollen glands.  Psychiatric/Behavioral:  Negative for depressed mood and sleep disturbance. The patient is not nervous/anxious.     PMFS History:  Patient Active Problem List   Diagnosis Date Noted   RUQ pain 09/10/2024   Ear pain 06/17/2024   Throat pain 11/04/2023   Acute right ankle pain 07/28/2023   Coronary artery calcification 12/02/2022   Leg pain 11/12/2022   Familial hypercholesterolemia 02/12/2022   Celiac disease 07/17/2021   Impaired fasting glucose 07/17/2021   Vertigo 03/25/2021   Gastritis with intestinal metaplasia of stomach 01/10/2021   S/P partial lobectomy of lung 04/12/2020   Nodule of upper lobe of right lung 04/05/2020   Bilateral breast cysts 12/27/2019   Multiple cysts of breast 12/27/2019   Malignant neoplasm of upper lobe of right lung (HCC) 10/12/2019   Sensorineural hearing loss (SNHL) of both ears 06/20/2019   Adenocarcinoma of lung, stage 1, right (HCC) 05/26/2019   Tinnitus of both ears 05/12/2019   B12 deficiency 04/11/2019   Cyst of ovary 04/01/2019   Menopause syndrome 04/01/2019   Osteopenia 04/01/2019   Vitamin D  deficiency 05/24/2015   Xanthoma of eyelid 12/21/2014   Cyst of kidney, acquired 12/20/2014   Interstitial cystitis (chronic) without hematuria 03/20/2012   Shortness of breath 08/16/2008   Nontoxic multinodular goiter 08/08/2007    Past Medical History:  Diagnosis Date   Allergy    sulfa  Chronic interstitial cystitis    GERD (gastroesophageal reflux disease) Unsure   Lung cancer (HCC)    per patient    Thyroid  disease     Family History  Problem Relation Age of Onset   Renal cancer Mother    Arthritis Mother    Cancer Mother    Kidney disease Mother    Heart attack Father    Heart disease Father    Stroke Maternal Grandmother    Heart attack  Paternal Grandfather    Multiple sclerosis Sister    Cancer Brother    Diabetes Brother    Arthritis Sister    Diabetes Sister    Past Surgical History:  Procedure Laterality Date   ABDOMINAL HYSTERECTOMY     BREAST SURGERY  05/2019   Non-cancer-- history of non-cancer lumps   lumps removed from breast     1970's ,1990's and in 2000's  nono cancer    LUNG SURGERY  04/05/2020   LUNG SURGERY  05/2024   Social History[1] Social History   Social History Narrative   Not on file     Immunization History  Administered Date(s) Administered   Influenza-Unspecified 12/28/2001   PFIZER(Purple Top)SARS-COV-2 Vaccination 12/03/2019, 12/31/2019, 07/05/2020, 08/04/2020, 02/09/2021, 07/21/2021   PNEUMOCOCCAL CONJUGATE-20 01/16/2022   PPD Test 11/27/2015   Pfizer Covid-19 Vaccine Bivalent Booster 56yrs & up 07/17/2021   Pfizer(Comirnaty)Fall Seasonal Vaccine 12 years and older 08/28/2022   Tdap 07/22/2008     Objective: Vital Signs: BP 109/68   Pulse 67   Temp (!) 97.5 F (36.4 C)   Resp 14   Ht 5' 3 (1.6 m)   Wt 119 lb 6.4 oz (54.2 kg)   BMI 21.15 kg/m    Physical Exam Vitals and nursing note reviewed.  Constitutional:      Appearance: She is well-developed.  HENT:     Head: Normocephalic and atraumatic.  Eyes:     Conjunctiva/sclera: Conjunctivae normal.  Cardiovascular:     Rate and Rhythm: Normal rate and regular rhythm.     Heart sounds: Normal heart sounds.  Pulmonary:     Effort: Pulmonary effort is normal.     Breath sounds: Normal breath sounds.  Abdominal:     General: Bowel sounds are normal.     Palpations: Abdomen is soft.  Musculoskeletal:     Cervical back: Normal range of motion.  Lymphadenopathy:     Cervical: No cervical adenopathy.  Skin:    General: Skin is warm and dry.     Capillary Refill: Capillary refill takes less than 2 seconds.  Neurological:     Mental Status: She is alert and oriented to person, place, and time.  Psychiatric:         Behavior: Behavior normal.      Musculoskeletal Exam: Cervical, thoracic and lumbar spine were in good range of motion.  There was no SI joint tenderness.  Shoulder joints, elbow joints, wrist joints, MCPs, PIPs and DIPs were in good range of motion with no synovitis.  Bilateral PIP and DIP thickening was noted.  No synovitis was noted.  Hip joints and knee joints were in good range of motion without any warmth swelling or effusion.  There was no tenderness over ankles or MTPs.   CDAI Exam: CDAI Score: -- Patient Global: --; Provider Global: -- Swollen: --; Tender: -- Joint Exam 11/10/2024   No joint exam has been documented for this visit   There is currently no information documented on the homunculus. Go  to the Rheumatology activity and complete the homunculus joint exam.  Investigation: No additional findings.  Imaging: No results found.   Recent Labs: Lab Results  Component Value Date   WBC 3.3 (L) 09/07/2024   HGB 13.4 09/07/2024   PLT 244.0 09/07/2024   NA 139 09/07/2024   K 4.3 09/07/2024   CL 102 09/07/2024   CO2 31 09/07/2024   GLUCOSE 81 09/07/2024   BUN 12 09/07/2024   CREATININE 0.83 09/07/2024   BILITOT 0.5 09/07/2024   ALKPHOS 58 09/07/2024   AST 17 09/07/2024   ALT 13 09/07/2024   PROT 7.2 09/07/2024   ALBUMIN 4.2 09/07/2024   CALCIUM  9.2 09/07/2024   GFRAA >60 02/04/2016   QFTBGOLDPLUS NEGATIVE 05/26/2019   November 12, 2023 double-stranded DNA negative, complements normal, sed rate normal, RF negative, uric acid 4.3 September 07, 2024 CBC WBC 3.3, CMP normal, LDL 195  Speciality Comments: No specialty comments available.  Procedures:  No procedures performed Allergies: Sulfa antibiotics, Gluten meal, Montelukast sodium, Tramadol , and Vitamin d  (calciferol)   Assessment / Plan:     Visit Diagnoses: Raynaud's disease without gangrene-currently not symptomatic.  No digital ulcers were noted.  She had good capillary refill and no  sclerodactyly.  Positive ANA (antinuclear antibody) - ANA 1: 320NS, ENA negative, complements normal, no clinical features of autoimmune disease.  Positive TPO antibody. -November 12, 2023 double-stranded DNA negative, complements normal, sed rate normal, RF negative, uric acid 4.3 Plan: Protein / creatinine ratio, urine, ANA, Anti-DNA antibody, double-stranded, C3 and C4, Sedimentation rate, CBC with Differential/Platelet.  Will contact her when the lab results are available.  She was advised to call us  if she develops any new symptoms.  Primary osteoarthritis of both knees -she good range of motion of bilateral knee joints.  She denies any discomfort today.  No warmth swelling or effusion was noted.  Moderate osteoarthritis bilateral knee joints and bilateral mild chondromalacia patella was noted on the previous x-rays.  Chronic pain of right ankle - October 24, 2023 MRI showed tenosynovitis and osteoarthritis.  She continues to have intermittent discomfort in her right ankle.  She states she gets injections by Dr. Gershon.  DDD (degenerative disc disease), cervical -she had good range of motion of the cervical spine without discomfort.  MRI results of cervical spine from September 16, 2023  DDD (degenerative disc disease), thoracic -she had no tenderness over thoracic region.  MRI of the thoracic spine on September 16, 2023 which was reviewed.  Which showed multilevel spondylosis and a small central disc protrusion from T1-T4.  Degeneration of intervertebral disc of lumbar region without discogenic back pain or lower extremity pain -she has intermittent lower back pain.  No point tenderness was noted.  Mild levoscoliosis, mild spondylosis and facet joint arthropathy noted on MRI July 2023.  .  Myalgia -   CK was normal in the past.  Vitamin D  deficiency-vitamin D  was normal couple of years ago.  Patient states she gets vitamin D  checked by her PCP.  Osteopenia, unspecified location -recent DEXA  scan showed a T-score of -1.1.  Need for regular exercise and muscle strengthening was discussed.  Adenocarcinoma of lung, stage 1, right (HCC)-patient had wedge resection last year which had benign pathology per patient.  S/P partial lobectomy of lung  Mixed hyperlipidemia  Nontoxic multinodular goiter  Interstitial cystitis (chronic) without hematuria  Sensorineural hearing loss (SNHL) of both ears  Bilateral fibrocystic breast disease (FCBD)  Cyst of kidney, acquired  B12 deficiency  Orders: Orders Placed This Encounter  Procedures   Protein / creatinine ratio, urine   ANA   Anti-DNA antibody, double-stranded   C3 and C4   Sedimentation rate   CBC with Differential/Platelet   No orders of the defined types were placed in this encounter.    Follow-Up Instructions: Return in about 1 year (around 11/10/2025).   Maya Nash, MD  Note - This record has been created using Animal nutritionist.  Chart creation errors have been sought, but may not always  have been located. Such creation errors do not reflect on  the standard of medical care.     [1]  Social History Tobacco Use   Smoking status: Never    Passive exposure: Never   Smokeless tobacco: Never  Vaping Use   Vaping status: Never Used  Substance Use Topics   Alcohol use: Yes    Comment: special occassions   Drug use: No   "

## 2024-11-10 ENCOUNTER — Encounter: Payer: Self-pay | Admitting: Rheumatology

## 2024-11-10 ENCOUNTER — Ambulatory Visit: Attending: Rheumatology | Admitting: Rheumatology

## 2024-11-10 VITALS — BP 109/68 | HR 67 | Temp 97.5°F | Resp 14 | Ht 63.0 in | Wt 119.4 lb

## 2024-11-10 DIAGNOSIS — N3010 Interstitial cystitis (chronic) without hematuria: Secondary | ICD-10-CM | POA: Insufficient documentation

## 2024-11-10 DIAGNOSIS — M8580 Other specified disorders of bone density and structure, unspecified site: Secondary | ICD-10-CM | POA: Insufficient documentation

## 2024-11-10 DIAGNOSIS — M51369 Other intervertebral disc degeneration, lumbar region without mention of lumbar back pain or lower extremity pain: Secondary | ICD-10-CM | POA: Insufficient documentation

## 2024-11-10 DIAGNOSIS — N6012 Diffuse cystic mastopathy of left breast: Secondary | ICD-10-CM | POA: Insufficient documentation

## 2024-11-10 DIAGNOSIS — I7300 Raynaud's syndrome without gangrene: Secondary | ICD-10-CM | POA: Insufficient documentation

## 2024-11-10 DIAGNOSIS — M25571 Pain in right ankle and joints of right foot: Secondary | ICD-10-CM | POA: Diagnosis present

## 2024-11-10 DIAGNOSIS — C3491 Malignant neoplasm of unspecified part of right bronchus or lung: Secondary | ICD-10-CM | POA: Insufficient documentation

## 2024-11-10 DIAGNOSIS — M170 Bilateral primary osteoarthritis of knee: Secondary | ICD-10-CM | POA: Diagnosis present

## 2024-11-10 DIAGNOSIS — H903 Sensorineural hearing loss, bilateral: Secondary | ICD-10-CM | POA: Diagnosis present

## 2024-11-10 DIAGNOSIS — G8929 Other chronic pain: Secondary | ICD-10-CM | POA: Insufficient documentation

## 2024-11-10 DIAGNOSIS — N281 Cyst of kidney, acquired: Secondary | ICD-10-CM | POA: Diagnosis present

## 2024-11-10 DIAGNOSIS — R7689 Other specified abnormal immunological findings in serum: Secondary | ICD-10-CM | POA: Diagnosis present

## 2024-11-10 DIAGNOSIS — E782 Mixed hyperlipidemia: Secondary | ICD-10-CM | POA: Insufficient documentation

## 2024-11-10 DIAGNOSIS — Z902 Acquired absence of lung [part of]: Secondary | ICD-10-CM | POA: Insufficient documentation

## 2024-11-10 DIAGNOSIS — M791 Myalgia, unspecified site: Secondary | ICD-10-CM | POA: Insufficient documentation

## 2024-11-10 DIAGNOSIS — M5030 Other cervical disc degeneration, unspecified cervical region: Secondary | ICD-10-CM | POA: Diagnosis present

## 2024-11-10 DIAGNOSIS — N6011 Diffuse cystic mastopathy of right breast: Secondary | ICD-10-CM | POA: Diagnosis present

## 2024-11-10 DIAGNOSIS — M5134 Other intervertebral disc degeneration, thoracic region: Secondary | ICD-10-CM | POA: Insufficient documentation

## 2024-11-10 DIAGNOSIS — E559 Vitamin D deficiency, unspecified: Secondary | ICD-10-CM | POA: Insufficient documentation

## 2024-11-10 DIAGNOSIS — E042 Nontoxic multinodular goiter: Secondary | ICD-10-CM | POA: Diagnosis present

## 2024-11-10 DIAGNOSIS — E538 Deficiency of other specified B group vitamins: Secondary | ICD-10-CM | POA: Insufficient documentation

## 2024-11-10 NOTE — Telephone Encounter (Signed)
Addressed.  See previous note.

## 2024-11-10 NOTE — Telephone Encounter (Signed)
 CRM has been addressed.

## 2024-11-11 ENCOUNTER — Ambulatory Visit: Payer: Medicare Other | Admitting: Rheumatology

## 2024-11-11 ENCOUNTER — Telehealth: Payer: Self-pay | Admitting: *Deleted

## 2024-11-11 DIAGNOSIS — Z902 Acquired absence of lung [part of]: Secondary | ICD-10-CM

## 2024-11-11 DIAGNOSIS — E559 Vitamin D deficiency, unspecified: Secondary | ICD-10-CM

## 2024-11-11 DIAGNOSIS — H903 Sensorineural hearing loss, bilateral: Secondary | ICD-10-CM

## 2024-11-11 DIAGNOSIS — N6011 Diffuse cystic mastopathy of right breast: Secondary | ICD-10-CM

## 2024-11-11 DIAGNOSIS — E782 Mixed hyperlipidemia: Secondary | ICD-10-CM

## 2024-11-11 DIAGNOSIS — M5030 Other cervical disc degeneration, unspecified cervical region: Secondary | ICD-10-CM

## 2024-11-11 DIAGNOSIS — M51369 Other intervertebral disc degeneration, lumbar region without mention of lumbar back pain or lower extremity pain: Secondary | ICD-10-CM

## 2024-11-11 DIAGNOSIS — R7689 Other specified abnormal immunological findings in serum: Secondary | ICD-10-CM

## 2024-11-11 DIAGNOSIS — N3010 Interstitial cystitis (chronic) without hematuria: Secondary | ICD-10-CM

## 2024-11-11 DIAGNOSIS — G8929 Other chronic pain: Secondary | ICD-10-CM

## 2024-11-11 DIAGNOSIS — M5134 Other intervertebral disc degeneration, thoracic region: Secondary | ICD-10-CM

## 2024-11-11 DIAGNOSIS — E042 Nontoxic multinodular goiter: Secondary | ICD-10-CM

## 2024-11-11 DIAGNOSIS — C3491 Malignant neoplasm of unspecified part of right bronchus or lung: Secondary | ICD-10-CM

## 2024-11-11 DIAGNOSIS — M791 Myalgia, unspecified site: Secondary | ICD-10-CM

## 2024-11-11 DIAGNOSIS — M170 Bilateral primary osteoarthritis of knee: Secondary | ICD-10-CM

## 2024-11-11 DIAGNOSIS — E538 Deficiency of other specified B group vitamins: Secondary | ICD-10-CM

## 2024-11-11 DIAGNOSIS — I7300 Raynaud's syndrome without gangrene: Secondary | ICD-10-CM

## 2024-11-11 DIAGNOSIS — N281 Cyst of kidney, acquired: Secondary | ICD-10-CM

## 2024-11-11 DIAGNOSIS — M8580 Other specified disorders of bone density and structure, unspecified site: Secondary | ICD-10-CM

## 2024-11-11 NOTE — Telephone Encounter (Signed)
 Contacted patient to advise Urine protein creatinine ratio is normal. High protein is a concern and low protein is not a concern.   Patient verbalized understanding.

## 2024-11-11 NOTE — Telephone Encounter (Signed)
 Urine protein creatinine ratio is normal. High protein is a concern and low protein is not a concern.

## 2024-11-11 NOTE — Telephone Encounter (Signed)
 Patient contacted the office and states she saw her labs on my chart and is concerned about her creatinine. Patient advised her labs have not been reviewed by Dr. Dolphus and I can not discuss until they have been reviewed. Patient was not happy with that explanation.

## 2024-11-12 ENCOUNTER — Ambulatory Visit: Payer: Self-pay | Admitting: Rheumatology

## 2024-11-12 LAB — ANTI-NUCLEAR AB-TITER (ANA TITER)
ANA TITER: 1:40 {titer} — ABNORMAL HIGH
ANA Titer 1: 1:160 {titer} — ABNORMAL HIGH

## 2024-11-12 LAB — CBC WITH DIFFERENTIAL/PLATELET
Absolute Lymphocytes: 1296 {cells}/uL (ref 850–3900)
Absolute Monocytes: 331 {cells}/uL (ref 200–950)
Basophils Absolute: 29 {cells}/uL (ref 0–200)
Basophils Relative: 0.6 %
Eosinophils Absolute: 82 {cells}/uL (ref 15–500)
Eosinophils Relative: 1.7 %
HCT: 40 % (ref 35.9–46.0)
Hemoglobin: 12.9 g/dL (ref 11.7–15.5)
MCH: 30 pg (ref 27.0–33.0)
MCHC: 32.3 g/dL (ref 31.6–35.4)
MCV: 93 fL (ref 81.4–101.7)
MPV: 10 fL (ref 7.5–12.5)
Monocytes Relative: 6.9 %
Neutro Abs: 3062 {cells}/uL (ref 1500–7800)
Neutrophils Relative %: 63.8 %
Platelets: 257 Thousand/uL (ref 140–400)
RBC: 4.3 Million/uL (ref 3.80–5.10)
RDW: 13.6 % (ref 11.0–15.0)
Total Lymphocyte: 27 %
WBC: 4.8 Thousand/uL (ref 3.8–10.8)

## 2024-11-12 LAB — C3 AND C4
C3 Complement: 126 mg/dL (ref 83–193)
C4 Complement: 23 mg/dL (ref 15–57)

## 2024-11-12 LAB — PROTEIN / CREATININE RATIO, URINE
Creatinine, Urine: 27 mg/dL (ref 20–275)
Total Protein, Urine: <4 mg/dL — ABNORMAL LOW (ref 5–24)

## 2024-11-12 LAB — ANTI-DNA ANTIBODY, DOUBLE-STRANDED: ds DNA Ab: 1 [IU]/mL

## 2024-11-12 LAB — ANA: Anti Nuclear Antibody (ANA): POSITIVE — AB

## 2024-11-12 LAB — SEDIMENTATION RATE: Sed Rate: 14 mm/h (ref 0–30)

## 2024-11-12 NOTE — Progress Notes (Signed)
 Urine protein creatinine ratio, double-stranded DNA, complements, sed rate, CBC are within normal limits.  ANA titer is pending.

## 2024-11-14 NOTE — Progress Notes (Signed)
 ANA remains positive and stable.  No change in treatment advised.

## 2024-12-02 ENCOUNTER — Telehealth: Payer: Self-pay

## 2024-12-02 NOTE — Telephone Encounter (Signed)
 She can wait to see Dr. Rollene tomorrow

## 2024-12-02 NOTE — Telephone Encounter (Signed)
 Copied from CRM [id number]. Topic: Clinical - Request for Lab/Test Order >> Dec 02, 2024  8:39 AM Antony RAMAN wrote: Reason for CRM: requesting ultrasound of both legs today [phone]

## 2024-12-03 ENCOUNTER — Encounter (HOSPITAL_BASED_OUTPATIENT_CLINIC_OR_DEPARTMENT_OTHER)

## 2024-12-03 ENCOUNTER — Encounter: Payer: Self-pay | Admitting: Internal Medicine

## 2024-12-03 ENCOUNTER — Ambulatory Visit: Admitting: Internal Medicine

## 2024-12-03 ENCOUNTER — Ambulatory Visit (HOSPITAL_COMMUNITY)
Admission: RE | Admit: 2024-12-03 | Discharge: 2024-12-03 | Disposition: A | Source: Ambulatory Visit | Attending: Internal Medicine | Admitting: Internal Medicine

## 2024-12-03 VITALS — BP 102/70 | HR 66 | Temp 98.0°F | Ht 63.0 in | Wt 117.4 lb

## 2024-12-03 DIAGNOSIS — K317 Polyp of stomach and duodenum: Secondary | ICD-10-CM | POA: Diagnosis not present

## 2024-12-03 DIAGNOSIS — G6289 Other specified polyneuropathies: Secondary | ICD-10-CM

## 2024-12-03 DIAGNOSIS — I739 Peripheral vascular disease, unspecified: Secondary | ICD-10-CM | POA: Diagnosis present

## 2024-12-03 DIAGNOSIS — G629 Polyneuropathy, unspecified: Secondary | ICD-10-CM | POA: Insufficient documentation

## 2024-12-03 LAB — VAS US ABI WITH/WO TBI
Left ABI: 1.28
Right ABI: 1.22
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
## 2024-12-07 ENCOUNTER — Ambulatory Visit: Payer: Self-pay | Admitting: Internal Medicine

## 2025-11-10 ENCOUNTER — Ambulatory Visit: Admitting: Rheumatology
